# Patient Record
Sex: Male | Born: 1960 | Race: White | Hispanic: No | Marital: Single | Smoking: Current every day smoker
Health system: Southern US, Community
[De-identification: ages and names within clinical notes are randomized; demographics above are authoritative.]

## PROBLEM LIST (undated history)

## (undated) DIAGNOSIS — I10 Essential (primary) hypertension: Secondary | ICD-10-CM

## (undated) DIAGNOSIS — Z87442 Personal history of urinary calculi: Secondary | ICD-10-CM

## (undated) DIAGNOSIS — R7303 Prediabetes: Secondary | ICD-10-CM

## (undated) DIAGNOSIS — I739 Peripheral vascular disease, unspecified: Secondary | ICD-10-CM

## (undated) DIAGNOSIS — E46 Unspecified protein-calorie malnutrition: Secondary | ICD-10-CM

## (undated) DIAGNOSIS — Z720 Tobacco use: Secondary | ICD-10-CM

## (undated) DIAGNOSIS — D509 Iron deficiency anemia, unspecified: Secondary | ICD-10-CM

## (undated) DIAGNOSIS — N200 Calculus of kidney: Secondary | ICD-10-CM

## (undated) DIAGNOSIS — J189 Pneumonia, unspecified organism: Secondary | ICD-10-CM

---

## 2012-08-06 ENCOUNTER — Emergency Department (HOSPITAL_COMMUNITY)
Admission: EM | Admit: 2012-08-06 | Discharge: 2012-08-07 | Disposition: A | Discharge status: Home / Self Care | Payer: Managed Care, Other (non HMO) | Attending: Emergency Medicine | Admitting: Emergency Medicine

## 2012-08-06 ENCOUNTER — Emergency Department (HOSPITAL_COMMUNITY): Payer: Managed Care, Other (non HMO)

## 2012-08-06 ENCOUNTER — Encounter (HOSPITAL_COMMUNITY): Payer: Self-pay | Admitting: Emergency Medicine

## 2012-08-06 DIAGNOSIS — M545 Low back pain, unspecified: Secondary | ICD-10-CM | POA: Insufficient documentation

## 2012-08-06 DIAGNOSIS — F17200 Nicotine dependence, unspecified, uncomplicated: Secondary | ICD-10-CM | POA: Insufficient documentation

## 2012-08-06 DIAGNOSIS — I10 Essential (primary) hypertension: Secondary | ICD-10-CM | POA: Insufficient documentation

## 2012-08-06 DIAGNOSIS — M549 Dorsalgia, unspecified: Secondary | ICD-10-CM

## 2012-08-06 DIAGNOSIS — Z87442 Personal history of urinary calculi: Secondary | ICD-10-CM | POA: Insufficient documentation

## 2012-08-06 DIAGNOSIS — Z79899 Other long term (current) drug therapy: Secondary | ICD-10-CM | POA: Insufficient documentation

## 2012-08-06 HISTORY — DX: Calculus of kidney: N20.0

## 2012-08-06 HISTORY — DX: Essential (primary) hypertension: I10

## 2012-08-06 LAB — URINALYSIS, ROUTINE W REFLEX MICROSCOPIC
Glucose, UA: NEGATIVE mg/dL
Ketones, ur: NEGATIVE mg/dL
Leukocytes, UA: NEGATIVE
Specific Gravity, Urine: 1.012 (ref 1.005–1.030)
pH: 5 (ref 5.0–8.0)

## 2012-08-06 MED ORDER — HYDROCHLOROTHIAZIDE 25 MG PO TABS: 25.0000 mg | ORAL_TABLET | Freq: Every day | ORAL | Status: AC

## 2012-08-06 MED ORDER — HYDROCODONE-ACETAMINOPHEN 5-325 MG PO TABS: 2.0000 | ORAL_TABLET | ORAL | Status: DC | PRN

## 2012-08-06 MED ORDER — OXYCODONE-ACETAMINOPHEN 5-325 MG PO TABS
1.0000 | ORAL_TABLET | Freq: Once | ORAL | Status: AC
  Administered 2012-08-06: 1 via ORAL
  Filled 2012-08-06: qty 1

## 2012-08-06 MED ORDER — CYCLOBENZAPRINE HCL 5 MG PO TABS: 5.0000 mg | ORAL_TABLET | Freq: Three times a day (TID) | ORAL | Status: DC | PRN

## 2012-08-06 MED ORDER — OXYCODONE-ACETAMINOPHEN 5-325 MG PO TABS
1.0000 | ORAL_TABLET | Freq: Once | ORAL | Status: AC
  Administered 2012-08-07: 1 via ORAL
  Filled 2012-08-06: qty 1

## 2012-08-06 MED ORDER — CYCLOBENZAPRINE HCL 10 MG PO TABS
5.0000 mg | ORAL_TABLET | Freq: Once | ORAL | Status: AC
  Administered 2012-08-07: 5 mg via ORAL
  Filled 2012-08-06: qty 1

## 2012-08-06 NOTE — ED Notes (Addendum)
C/o R sided lower back pain since yesterday.  Pain worse with movement.  Denies urinary complaints.  Pain now 4/10 with intermittent episodes of pain 10/10. Pt has history of kidney stones 24 years ago.

## 2012-08-06 NOTE — ED Provider Notes (Signed)
History   This chart was scribed for Arman Filter, NP by Melba Coon, ED Scribe. The patient was seen in room TR10C/TR10C and the patient's care was started at 9:26PM.    CSN: [id number]  Arrival date & time 08/06/12  2055   First MD Initiated Contact with Patient 08/06/12 2117      Chief Complaint  Patient presents with  . Back Pain    (Consider location/radiation/quality/duration/timing/severity/associated sxs/prior treatment) The history is provided by the patient. No language interpreter was used.   Kirk Carrillo is a 52 y.o. male who presents to the Emergency Department complaining of intermittent, moderate to severe back pain with an onset today. He reports no mechanism of injury, falls, or trauma to his back. He reports that the pain is similar to when he had nephrolithiasis 25 years ago. He has not taken any pain meds at home. He reports that coughing and certain movements aggravate the pain. Denies HA, fever, diaphoresis, chills, neck pain, sore throat, rash, CP, SOB, abd pain, nausea, emesis, diarrhea, dysuria, bowel or bladder dysfunction, or extremity pain, edema, weakness, numbness, or tingling. No IV drug abuse. No known allergies. No other pertinent medical symptoms.   Past Medical History  Diagnosis Date  . Kidney stone   . Hypertension     History reviewed. No pertinent past surgical history.  No family history on file.  History  Substance Use Topics  . Smoking status: Current Every Day Smoker  . Smokeless tobacco: Not on file  . Alcohol Use: Yes      Review of Systems  Constitutional: Negative for fever, chills and diaphoresis.       10 Systems reviewed and are negative for acute change except as noted in the HPI.  HENT: Negative for congestion, rhinorrhea and neck pain.   Eyes: Negative for discharge and redness.  Respiratory: Negative for cough and shortness of breath.   Cardiovascular: Negative for chest pain.  Gastrointestinal: Negative for  vomiting, abdominal pain and diarrhea.  Genitourinary: Negative for dysuria.  Musculoskeletal: Positive for back pain.  Skin: Negative for rash.  Neurological: Negative for dizziness, syncope, speech difficulty, weakness, numbness and headaches.  Psychiatric/Behavioral: Negative for suicidal ideas, hallucinations and confusion.       No behavior change.   10 Systems reviewed and all are negative for acute change except as noted in the HPI.   Allergies  Review of patient's allergies indicates no known allergies.  Home Medications   Current Outpatient Rx  Name  Route  Sig  Dispense  Refill  . CYCLOBENZAPRINE HCL 5 MG PO TABS   Oral   Take 1 tablet (5 mg total) by mouth 3 (three) times daily as needed for muscle spasms.   30 tablet   0   . HYDROCHLOROTHIAZIDE 25 MG PO TABS   Oral   Take 1 tablet (25 mg total) by mouth daily.   30 tablet   2   . HYDROCODONE-ACETAMINOPHEN 5-325 MG PO TABS   Oral   Take 2 tablets by mouth every 4 (four) hours as needed for pain.   10 tablet   0     BP 162/78  Pulse 92  Temp 98.9 F (37.2 C) (Oral)  Resp 16  SpO2 97%  Physical Exam  Nursing note and vitals reviewed. Constitutional:       Awake, alert, nontoxic appearance.  HENT:  Head: Atraumatic.  Eyes: Right eye exhibits no discharge. Left eye exhibits no discharge.  Neck: Normal range of  motion. Neck supple.       No C-spine tenderness.  Cardiovascular: Normal rate, regular rhythm and normal heart sounds.   Pulmonary/Chest: Effort normal. He exhibits no tenderness.  Abdominal: Soft. There is no tenderness. There is no rebound.  Musculoskeletal: He exhibits no tenderness.       Baseline ROM, no obvious new focal weakness. Spine is non-tender.  Neurological:       Mental status and motor strength appears baseline for patient and situation. Gait is normal and even.  Skin: No rash noted.  Psychiatric: He has a normal mood and affect.    ED Course  Procedures (including  critical care time)  COORDINATION OF CARE:  9:34PM - abd/pelvis CT w/o contrast and UA will be ordered for Kirk Carrillo.      Labs Reviewed  URINALYSIS, ROUTINE W REFLEX MICROSCOPIC  LAB REPORT - SCANNED   No results found.   1. Back pain       MDM     I personally performed the services described in this documentation, which was scribed in my presence. The recorded information has been reviewed and is accurate.      Arman Filter, NP 08/10/12 0308  Arman Filter, NP 08/10/12 [phone]

## 2012-08-06 NOTE — ED Notes (Signed)
Patient transported to CT 

## 2012-08-10 NOTE — ED Provider Notes (Signed)
Medical screening examination/treatment/procedure(s) were performed by non-physician practitioner and as supervising physician I was immediately available for consultation/collaboration.   Dione Booze, MD 08/10/12 [phone]

## 2013-08-15 ENCOUNTER — Institutional Professional Consult (permissible substitution): Payer: Self-pay | Admitting: Medical

## 2020-02-15 ENCOUNTER — Ambulatory Visit (HOSPITAL_COMMUNITY)
Admission: EM | Admit: 2020-02-15 | Discharge: 2020-02-15 | Disposition: A | Discharge status: Home / Self Care | Payer: HRSA Program | Attending: Emergency Medicine | Admitting: Emergency Medicine

## 2020-02-15 ENCOUNTER — Other Ambulatory Visit: Payer: Self-pay

## 2020-02-15 ENCOUNTER — Encounter (HOSPITAL_COMMUNITY): Payer: Self-pay

## 2020-02-15 DIAGNOSIS — R0981 Nasal congestion: Secondary | ICD-10-CM | POA: Insufficient documentation

## 2020-02-15 DIAGNOSIS — Z79899 Other long term (current) drug therapy: Secondary | ICD-10-CM | POA: Insufficient documentation

## 2020-02-15 DIAGNOSIS — F17210 Nicotine dependence, cigarettes, uncomplicated: Secondary | ICD-10-CM | POA: Diagnosis not present

## 2020-02-15 DIAGNOSIS — Z20822 Contact with and (suspected) exposure to covid-19: Secondary | ICD-10-CM | POA: Diagnosis not present

## 2020-02-15 DIAGNOSIS — I10 Essential (primary) hypertension: Secondary | ICD-10-CM | POA: Insufficient documentation

## 2020-02-15 DIAGNOSIS — J40 Bronchitis, not specified as acute or chronic: Secondary | ICD-10-CM | POA: Diagnosis present

## 2020-02-15 MED ORDER — BENZONATATE 100 MG PO CAPS: 100.0000 mg | ORAL_CAPSULE | Freq: Three times a day (TID) | ORAL | 0 refills | Status: DC | PRN

## 2020-02-15 MED ORDER — ALBUTEROL SULFATE HFA 108 (90 BASE) MCG/ACT IN AERS: 1.0000 | INHALATION_SPRAY | Freq: Four times a day (QID) | RESPIRATORY_TRACT | 0 refills | Status: DC | PRN

## 2020-02-15 MED ORDER — PREDNISONE 20 MG PO TABS: 40.0000 mg | ORAL_TABLET | Freq: Every day | ORAL | 0 refills | Status: AC

## 2020-02-15 NOTE — ED Provider Notes (Signed)
[facility]    CSN: [id number] Arrival date & time: 02/15/20  0830      History   Chief Complaint Chief Complaint  Patient presents with  . Cough  . Nasal Congestion    HPI Nazario Russom is a 59 y.o. male.   Baker Moronta presents with complaints of "nagging" dry cough for the past 3 weeks. Worse at night. No fever. No sore throat. No gi symptoms. No nasal congestion. He states it is "like a smokers cough" but worse than typical. He has been smoking for 40 years, currently approximately 1/2ppd. No chest pain. No arm pain or palpitations. Has been trying over the counter medications such as robitussin which haven't helped. Doesn't have an inhaler. States he does have a PCP he follows with.    ROS per HPI, negative if not otherwise mentioned.      Past Medical History:  Diagnosis Date  . Hypertension   . Kidney stone     There are no problems to display for this patient.   History reviewed. No pertinent surgical history.     Home Medications    Prior to Admission medications   Medication Sig Start Date End Date Taking? Authorizing Provider  hydrochlorothiazide (HYDRODIURIL) 25 MG tablet Take 1 tablet (25 mg total) by mouth daily. 08/06/12  Yes Earley Favor, NP  albuterol [facility] HFA) 108 [phone] Base) MCG/ACT inhaler Inhale 1-2 puffs into the lungs every 6 (six) hours as needed for wheezing or shortness of breath. 02/15/20   Georgetta Haber, NP  benzonatate (TESSALON) 100 MG capsule Take 1-2 capsules (100-200 mg total) by mouth 3 (three) times daily as needed for cough. 02/15/20   Georgetta Haber, NP  cyclobenzaprine (FLEXERIL) 5 MG tablet Take 1 tablet (5 mg total) by mouth 3 (three) times daily as needed for muscle spasms. 08/06/12   Earley Favor, NP  HYDROcodone-acetaminophen (NORCO/VICODIN) 5-325 MG per tablet Take 2 tablets by mouth every 4 (four) hours as needed for pain. 08/06/12   Earley Favor, NP  predniSONE (DELTASONE) 20 MG tablet Take 2 tablets (40 mg  total) by mouth daily with breakfast for 5 days. 02/15/20 02/20/20  Georgetta Haber, NP    Family History Family History  Problem Relation Age of Onset  . Cancer Mother     Social History Social History   Tobacco Use  . Smoking status: Current Every Day Smoker  . Smokeless tobacco: Never Used  Vaping Use  . Vaping Use: Never used  Substance Use Topics  . Alcohol use: Yes  . Drug use: No     Allergies   Telmisartan   Review of Systems Review of Systems   Physical Exam Triage Vital Signs ED Triage Vitals  Enc Vitals Group     BP 02/15/20 0955 118/72     Pulse Rate 02/15/20 0955 98     Resp 02/15/20 0955 18     Temp 02/15/20 0955 98.4 F (36.9 C)     Temp Source 02/15/20 0955 Oral     SpO2 02/15/20 0955 98 %     Weight --      Height --      Head Circumference --      Peak Flow --      Pain Score 02/15/20 0951 0     Pain Loc --      Pain Edu? --      Excl. in GC? --    No data found.  Updated Vital Signs  BP 118/72 (BP Location: Left Arm)   Pulse 98   Temp 98.4 F (36.9 C) (Oral)   Resp 18   SpO2 98%    Physical Exam Constitutional:      Appearance: He is well-developed.  Cardiovascular:     Rate and Rhythm: Normal rate and regular rhythm.  Pulmonary:     Effort: Pulmonary effort is normal.     Comments: Coarse lung sounds throughout  Skin:    General: Skin is warm and dry.  Neurological:     Mental Status: He is alert and oriented to person, place, and time.      UC Treatments / Results  Labs (all labs ordered are listed, but only abnormal results are displayed) Labs Reviewed  SARS CORONAVIRUS 2 (TAT 6-24 HRS)    EKG   Radiology No results found.  Procedures Procedures (including critical care time)  Medications Ordered in UC Medications - No data to display  Initial Impression / Assessment and Plan / UC Course  I have reviewed the triage vital signs and the nursing notes.  Pertinent labs & imaging results that were  available during my care of the patient were reviewed by me and considered in my medical decision making (see chart for details).     Non toxic, no increased work of breathing. Smoker for 40 years with worsening cough. No fevers or other URI symptoms. Do recommend a chest xray today, patient declines, however. Prednisone pack provided and recommendations for follow up and recheck with pcp. Return precautions provided. Patient verbalized understanding and agreeable to plan.   Final Clinical Impressions(s) / UC Diagnoses   Final diagnoses:  Bronchitis     Discharge Instructions     I do recommend a chest xray due to your cough and smoking history.  Complete course of steroids.  Use of inhaler as needed for wheezing or shortness of breath.   Use of tessalon as needed for cough.  If symptoms worsen or do not improve in the next week to return to be seen or to follow up with your PCP.     ED Prescriptions    Medication Sig Dispense Auth. Provider   albuterol (PROAIR HFA) 108 (90 Base) MCG/ACT inhaler Inhale 1-2 puffs into the lungs every 6 (six) hours as needed for wheezing or shortness of breath. 8 g Linus Mako B, NP   predniSONE (DELTASONE) 20 MG tablet Take 2 tablets (40 mg total) by mouth daily with breakfast for 5 days. 10 tablet Linus Mako B, NP   benzonatate (TESSALON) 100 MG capsule Take 1-2 capsules (100-200 mg total) by mouth 3 (three) times daily as needed for cough. 21 capsule Georgetta Haber, NP     PDMP not reviewed this encounter.   Georgetta Haber, NP 02/15/20 1423

## 2020-02-15 NOTE — ED Triage Notes (Signed)
Pt c/o non-productive cough for approx 3 weeks. Congestion, runny nose onset yesterday. Also reports change is sense of taste. OTC medications not fully relieving symptoms.  Denies fever, chills, SOB, abdominal pain, n/v/d, body aches. Able to speak full sentences w/o difficulty. Bilat breath sounds CTA.

## 2020-02-15 NOTE — Discharge Instructions (Addendum)
I do recommend a chest xray due to your cough and smoking history.  Complete course of steroids.  Use of inhaler as needed for wheezing or shortness of breath.   Use of tessalon as needed for cough.  If symptoms worsen or do not improve in the next week to return to be seen or to follow up with your PCP.

## 2020-02-16 LAB — SARS CORONAVIRUS 2 (TAT 6-24 HRS): SARS Coronavirus 2: NEGATIVE

## 2020-06-20 ENCOUNTER — Observation Stay (HOSPITAL_COMMUNITY): Payer: BLUE CROSS/BLUE SHIELD

## 2020-06-20 ENCOUNTER — Emergency Department (HOSPITAL_COMMUNITY): Payer: BLUE CROSS/BLUE SHIELD

## 2020-06-20 ENCOUNTER — Encounter (HOSPITAL_COMMUNITY): Payer: Self-pay | Admitting: Emergency Medicine

## 2020-06-20 ENCOUNTER — Inpatient Hospital Stay (HOSPITAL_COMMUNITY)
Admission: EM | Admit: 2020-06-20 | Discharge: 2020-06-28 | DRG: 871 | Disposition: A | Discharge status: Home / Self Care | Payer: BLUE CROSS/BLUE SHIELD | Source: Ambulatory Visit | Attending: Internal Medicine | Admitting: Internal Medicine

## 2020-06-20 DIAGNOSIS — D509 Iron deficiency anemia, unspecified: Secondary | ICD-10-CM | POA: Diagnosis present

## 2020-06-20 DIAGNOSIS — R042 Hemoptysis: Secondary | ICD-10-CM | POA: Diagnosis present

## 2020-06-20 DIAGNOSIS — J869 Pyothorax without fistula: Secondary | ICD-10-CM | POA: Diagnosis present

## 2020-06-20 DIAGNOSIS — E876 Hypokalemia: Secondary | ICD-10-CM | POA: Diagnosis present

## 2020-06-20 DIAGNOSIS — Z803 Family history of malignant neoplasm of breast: Secondary | ICD-10-CM

## 2020-06-20 DIAGNOSIS — I739 Peripheral vascular disease, unspecified: Secondary | ICD-10-CM | POA: Diagnosis present

## 2020-06-20 DIAGNOSIS — R918 Other nonspecific abnormal finding of lung field: Secondary | ICD-10-CM

## 2020-06-20 DIAGNOSIS — I10 Essential (primary) hypertension: Secondary | ICD-10-CM | POA: Diagnosis present

## 2020-06-20 DIAGNOSIS — Z9889 Other specified postprocedural states: Secondary | ICD-10-CM

## 2020-06-20 DIAGNOSIS — F17210 Nicotine dependence, cigarettes, uncomplicated: Secondary | ICD-10-CM | POA: Diagnosis present

## 2020-06-20 DIAGNOSIS — Z888 Allergy status to other drugs, medicaments and biological substances status: Secondary | ICD-10-CM

## 2020-06-20 DIAGNOSIS — R6510 Systemic inflammatory response syndrome (SIRS) of non-infectious origin without acute organ dysfunction: Secondary | ICD-10-CM

## 2020-06-20 DIAGNOSIS — J850 Gangrene and necrosis of lung: Secondary | ICD-10-CM | POA: Diagnosis not present

## 2020-06-20 DIAGNOSIS — A419 Sepsis, unspecified organism: Principal | ICD-10-CM | POA: Diagnosis present

## 2020-06-20 DIAGNOSIS — R61 Generalized hyperhidrosis: Secondary | ICD-10-CM | POA: Diagnosis present

## 2020-06-20 DIAGNOSIS — Z87442 Personal history of urinary calculi: Secondary | ICD-10-CM

## 2020-06-20 DIAGNOSIS — I70221 Atherosclerosis of native arteries of extremities with rest pain, right leg: Secondary | ICD-10-CM | POA: Diagnosis present

## 2020-06-20 DIAGNOSIS — J90 Pleural effusion, not elsewhere classified: Secondary | ICD-10-CM

## 2020-06-20 DIAGNOSIS — E538 Deficiency of other specified B group vitamins: Secondary | ICD-10-CM | POA: Diagnosis present

## 2020-06-20 DIAGNOSIS — E11621 Type 2 diabetes mellitus with foot ulcer: Secondary | ICD-10-CM | POA: Diagnosis present

## 2020-06-20 DIAGNOSIS — Z681 Body mass index (BMI) 19 or less, adult: Secondary | ICD-10-CM

## 2020-06-20 DIAGNOSIS — E440 Moderate protein-calorie malnutrition: Secondary | ICD-10-CM | POA: Diagnosis present

## 2020-06-20 DIAGNOSIS — F1010 Alcohol abuse, uncomplicated: Secondary | ICD-10-CM | POA: Diagnosis present

## 2020-06-20 DIAGNOSIS — Z20822 Contact with and (suspected) exposure to covid-19: Secondary | ICD-10-CM | POA: Diagnosis present

## 2020-06-20 DIAGNOSIS — J189 Pneumonia, unspecified organism: Secondary | ICD-10-CM | POA: Diagnosis present

## 2020-06-20 DIAGNOSIS — Z9689 Presence of other specified functional implants: Secondary | ICD-10-CM

## 2020-06-20 DIAGNOSIS — L97512 Non-pressure chronic ulcer of other part of right foot with fat layer exposed: Secondary | ICD-10-CM | POA: Diagnosis present

## 2020-06-20 DIAGNOSIS — E871 Hypo-osmolality and hyponatremia: Secondary | ICD-10-CM | POA: Diagnosis present

## 2020-06-20 HISTORY — DX: Peripheral vascular disease, unspecified: I73.9

## 2020-06-20 HISTORY — DX: Unspecified protein-calorie malnutrition: E46

## 2020-06-20 HISTORY — DX: Tobacco use: Z72.0

## 2020-06-20 HISTORY — DX: Iron deficiency anemia, unspecified: D50.9

## 2020-06-20 LAB — CBC WITH DIFFERENTIAL/PLATELET
Abs Immature Granulocytes: 0.17 10*3/uL — ABNORMAL HIGH (ref 0.00–0.07)
Basophils Absolute: 0.1 10*3/uL (ref 0.0–0.1)
Basophils Relative: 0 %
Eosinophils Absolute: 0.1 10*3/uL (ref 0.0–0.5)
Eosinophils Relative: 0 %
HCT: 30.7 % — ABNORMAL LOW (ref 39.0–52.0)
Hemoglobin: 9.6 g/dL — ABNORMAL LOW (ref 13.0–17.0)
Immature Granulocytes: 1 %
Lymphocytes Relative: 13 %
Lymphs Abs: 2.5 10*3/uL (ref 0.7–4.0)
MCH: 24.9 pg — ABNORMAL LOW (ref 26.0–34.0)
MCHC: 31.3 g/dL (ref 30.0–36.0)
MCV: 79.5 fL — ABNORMAL LOW (ref 80.0–100.0)
Monocytes Absolute: 1.2 10*3/uL — ABNORMAL HIGH (ref 0.1–1.0)
Monocytes Relative: 6 %
Neutro Abs: 15.1 10*3/uL — ABNORMAL HIGH (ref 1.7–7.7)
Neutrophils Relative %: 80 %
Platelets: 673 10*3/uL — ABNORMAL HIGH (ref 150–400)
RBC: 3.86 MIL/uL — ABNORMAL LOW (ref 4.22–5.81)
RDW: 16.3 % — ABNORMAL HIGH (ref 11.5–15.5)
WBC: 19.1 10*3/uL — ABNORMAL HIGH (ref 4.0–10.5)
nRBC: 0 % (ref 0.0–0.2)

## 2020-06-20 LAB — BASIC METABOLIC PANEL
Anion gap: 15 (ref 5–15)
BUN: 11 mg/dL (ref 8–23)
CO2: 25 mmol/L (ref 22–32)
Calcium: 8.9 mg/dL (ref 8.9–10.3)
Chloride: 89 mmol/L — ABNORMAL LOW (ref 98–111)
Creatinine, Ser: 0.69 mg/dL (ref 0.61–1.24)
GFR, Estimated: >60 mL/min (ref >60)
Glucose, Bld: 88 mg/dL (ref 70–99)
Potassium: 3.6 mmol/L (ref 3.5–5.1)
Sodium: 129 mmol/L — ABNORMAL LOW (ref 135–145)

## 2020-06-20 LAB — HEPATIC FUNCTION PANEL
ALT: 20 U/L (ref 0–44)
AST: 22 U/L (ref 15–41)
Albumin: 2.3 g/dL — ABNORMAL LOW (ref 3.5–5.0)
Alkaline Phosphatase: 86 U/L (ref 38–126)
Bilirubin, Direct: 0.1 mg/dL (ref 0.0–0.2)
Indirect Bilirubin: 1.3 mg/dL — ABNORMAL HIGH (ref 0.3–0.9)
Total Bilirubin: 1.4 mg/dL — ABNORMAL HIGH (ref 0.3–1.2)
Total Protein: 7.5 g/dL (ref 6.5–8.1)

## 2020-06-20 LAB — LIPASE, BLOOD: Lipase: 19 U/L (ref 11–51)

## 2020-06-20 LAB — LACTIC ACID, PLASMA: Lactic Acid, Venous: 1 mmol/L (ref 0.5–1.9)

## 2020-06-20 MED ORDER — PIPERACILLIN-TAZOBACTAM 3.375 G IVPB: 3.3750 g | Freq: Three times a day (TID) | INTRAVENOUS | Status: DC

## 2020-06-20 MED ORDER — VANCOMYCIN HCL 750 MG/150ML IV SOLN
750.0000 mg | Freq: Two times a day (BID) | INTRAVENOUS | Status: DC
  Administered 2020-06-21 (×2): 750 mg via INTRAVENOUS
  Filled 2020-06-20 (×4): qty 150

## 2020-06-20 MED ORDER — PIPERACILLIN-TAZOBACTAM 3.375 G IVPB 30 MIN
3.3750 g | Freq: Once | INTRAVENOUS | Status: AC
  Administered 2020-06-20: 3.375 g via INTRAVENOUS
  Filled 2020-06-20: qty 50

## 2020-06-20 MED ORDER — SODIUM CHLORIDE 0.9 % IV BOLUS
1000.0000 mL | Freq: Once | INTRAVENOUS | Status: AC
  Administered 2020-06-20: 1000 mL via INTRAVENOUS

## 2020-06-20 MED ORDER — VANCOMYCIN HCL IN DEXTROSE 1-5 GM/200ML-% IV SOLN
1000.0000 mg | Freq: Once | INTRAVENOUS | Status: AC
  Administered 2020-06-21: 1000 mg via INTRAVENOUS
  Filled 2020-06-20: qty 200

## 2020-06-20 MED ORDER — IOHEXOL 300 MG/ML  SOLN
75.0000 mL | Freq: Once | INTRAMUSCULAR | Status: AC | PRN
  Administered 2020-06-20: 75 mL via INTRAVENOUS

## 2020-06-20 NOTE — Progress Notes (Signed)
Pharmacy Antibiotic Note  Kirk Carrillo is a 59 y.o. male admitted on 06/20/2020 with pneumonia and Wound infection.  Pharmacy has been consulted for Zosyn and Vancomycin dosing.   Height: 5' 7.5" (171.5 cm) Weight: 56.1 kg (123 lb 10.9 oz) IBW/kg (Calculated) : 67.25  Temp (24hrs), Avg:99.3 F (37.4 C), Min:99.2 F (37.3 C), Max:99.4 F (37.4 C)  Recent Labs  Lab 06/20/20 2134 06/20/20 2147  WBC 19.1*  --   CREATININE 0.69  --   LATICACIDVEN  --  1.0    Estimated Creatinine Clearance: 76.9 mL/min (by C-G formula based on SCr of 0.69 mg/dL).    Allergies  Allergen Reactions  . Telmisartan Other (See Comments)    Facial; angioedema.     Antimicrobials this admission: 11/19 Zosyn >>  11/19 Vancomycin >>   Dose adjustments this admission: N/a  Microbiology results: Pending   Plan:  - Zosyn 3.375g IV x 1 dose over 30 min followed by Zosyn 3.375g IV every 8 hours infused over 4 hours  - Vancomycin 1000mg  IV x 1 dose  - Followed by Vancomycin 750mg  IV q12h - Goal trough ~ 15 - Monitor patients renal function and urine output  - De-escalate ABX when appropriate   Thank you for allowing pharmacy to be a part of this patient's care.  PharmD. BCPS 06/20/2020 10:42 PM

## 2020-06-20 NOTE — ED Provider Notes (Signed)
[facility] EMERGENCY DEPARTMENT Provider Note   CSN: [id number] Arrival date & time: 06/20/20  1231     History Chief Complaint  Patient presents with   Foot Pain    Kirk Carrillo is a 59 y.o. male with history of hypertension, tobacco use presents to the ED from [facility] urgent care for evaluation of right foot wounds.  Patient states a few weeks ago he got new sneakers that were tight around the toes.  Last week he noticed some discomfort in between his right fourth and fifth toes where they touch and noticed that he had wounds there.  Reports some soreness there but not significant.  Went to urgent care earlier today who told him that he had bone exposed through one of the wounds.  He had blood work and his WBC was 22.  They tried to get him to be seen by a podiatrist but was unable to be fit into his scheduled and told to come to the ED for further evaluation.  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  Reports a longstanding "smoker's cough" for years.  States he has been cutting back on his smoking and actually cough seems a little better except yesterday he had 3 coughing fits that were so forceful that he vomited his dinner 3 times.  No hemoptysis. Has not weighed himself in 6 to 8 months but today he noticed he had dropped 18 pounds unintentionally.  Had fevers and chills after his Covid shot but this resolved after 18 hours.  No longer having fever, chills.  No chest pain, shortness of breath.  No abdominal pain.  No diarrhea.  No dysuria.  No history of diabetes.   HPI     Past Medical History:  Diagnosis Date   Hypertension    Kidney stone     Patient Active Problem List   Diagnosis Date Noted   Sepsis (HCC) 06/20/2020    History reviewed. No pertinent surgical history.     Family History  Problem Relation Age of Onset   Cancer Mother     Social History    Tobacco Use   Smoking status: Current Every Day Smoker   Smokeless tobacco: Never Used  Vaping Use   Vaping Use: Never used  Substance Use Topics   Alcohol use: Yes   Drug use: No    Home Medications Prior to Admission medications   Medication Sig Start Date End Date Taking? Authorizing Provider  albuterol (PROAIR HFA) 108 (90 Base) MCG/ACT inhaler Inhale 1-2 puffs into the lungs every 6 (six) hours as needed for wheezing or shortness of breath. 02/15/20   Georgetta Haber, NP  benzonatate (TESSALON) 100 MG capsule Take 1-2 capsules (100-200 mg total) by mouth 3 (three) times daily as needed for cough. 02/15/20   Georgetta Haber, NP  cyclobenzaprine (FLEXERIL) 5 MG tablet Take 1 tablet (5 mg total) by mouth 3 (three) times daily as needed for muscle spasms. 08/06/12   Earley Favor, NP  hydrochlorothiazide (HYDRODIURIL) 25 MG tablet Take 1 tablet (25 mg total) by mouth daily. 08/06/12   Earley Favor, NP  HYDROcodone-acetaminophen (NORCO/VICODIN) 5-325 MG per tablet Take 2 tablets by mouth every 4 (four) hours as needed for pain. 08/06/12   Earley Favor, NP    Allergies    Telmisartan  Review of Systems   Review of Systems  Skin: Positive for wound.  All other  systems reviewed and are negative.   Physical Exam Updated Vital Signs BP 132/84    Pulse 100    Temp 99.4 F (37.4 C) (Oral)    Resp 18    Ht 5' 7.5" (1.715 m)    Wt 56.1 kg    SpO2 96%    BMI 19.08 kg/m   Physical Exam Vitals and nursing note reviewed.  Constitutional:      General: He is not in acute distress.    Appearance: He is well-developed.     Comments: NAD.  HENT:     Head: Normocephalic and atraumatic.     Right Ear: External ear normal.     Left Ear: External ear normal.     Nose: Nose normal.  Eyes:     General: No scleral icterus.    Conjunctiva/sclera: Conjunctivae normal.  Cardiovascular:     Rate and Rhythm: Normal rate and regular rhythm.     Heart sounds: Normal heart sounds. No murmur  heard.   Pulmonary:     Effort: Pulmonary effort is normal.     Comments: Decreased BS right lung. No wheezing. No crackles. No increased work of breathing  Musculoskeletal:        General: No deformity. Normal range of motion.     Cervical back: Normal range of motion and neck supple.  Skin:    General: Skin is warm and dry.     Capillary Refill: Capillary refill takes less than 2 seconds.     Comments: Ulcerated wound lateral 4th toe with white tissue exposed, mildly tender. Wound edges erythematous.   Ulcerated wound medial 5th toe with subcutaneous tissue exposed, no tenderness. No erythema.    No evidence of focal fluctuance or abscess. No significant erythema proximally.   Varicose veins noted around right ankle   Neurological:     Mental Status: He is alert and oriented to person, place, and time.  Psychiatric:        Behavior: Behavior normal.        Thought Content: Thought content normal.        Judgment: Judgment normal.         ED Results / Procedures / Treatments   Labs (all labs ordered are listed, but only abnormal results are displayed) Labs Reviewed  CBC WITH DIFFERENTIAL/PLATELET - Abnormal; Notable for the following components:      Result Value   WBC 19.1 (*)    RBC 3.86 (*)    Hemoglobin 9.6 (*)    HCT 30.7 (*)    MCV 79.5 (*)    MCH 24.9 (*)    RDW 16.3 (*)    Platelets 673 (*)    Neutro Abs 15.1 (*)    Monocytes Absolute 1.2 (*)    Abs Immature Granulocytes 0.17 (*)    All other components within normal limits  HEPATIC FUNCTION PANEL - Abnormal; Notable for the following components:   Albumin 2.3 (*)    Total Bilirubin 1.4 (*)    Indirect Bilirubin 1.3 (*)    All other components within normal limits  BASIC METABOLIC PANEL - Abnormal; Notable for the following components:   Sodium 129 (*)    Chloride 89 (*)    All other components within normal limits  CULTURE, BLOOD (ROUTINE X 2)  CULTURE, BLOOD (ROUTINE X 2)  RESPIRATORY PANEL BY RT  PCR (FLU A&B, COVID)  EXPECTORATED SPUTUM ASSESSMENT W REFEX TO RESP CULTURE  LACTIC ACID, PLASMA  LIPASE, BLOOD  LACTIC  ACID, PLASMA  URINALYSIS, COMPLETE (UACMP) WITH MICROSCOPIC    EKG None  Radiology DG Chest 2 View  Result Date: 06/20/2020 CLINICAL DATA:  Chronic cough, weight loss EXAM: CHEST - 2 VIEW COMPARISON:  None. FINDINGS: Heart is normal size. Patchy airspace disease throughout the right lung. Small right pleural effusion. Left lung clear. No acute bony abnormality. IMPRESSION: Volume loss with patchy airspace disease throughout the right lung. Cannot exclude pneumonia. Small right pleural effusion. Electronically Signed   By: Charlett Nose M.D.   On: 06/20/2020 21:33   DG Foot Complete Right  Result Date: 06/20/2020 CLINICAL DATA:  Deep wound lateral 4th and 5th toes. EXAM: RIGHT FOOT COMPLETE - 3+ VIEW COMPARISON:  None. FINDINGS: No acute bony abnormality. Specifically, no fracture, subluxation, or dislocation. No bone destruction to suggest osteomyelitis. No soft tissue gas. Joint spaces maintained. IMPRESSION: No acute bony abnormality. Electronically Signed   By: Charlett Nose M.D.   On: 06/20/2020 21:32    Procedures Procedures (including critical care time)  Medications Ordered in ED Medications  piperacillin-tazobactam (ZOSYN) IVPB 3.375 g (3.375 g Intravenous New Bag/Given 06/20/20 2326)  vancomycin (VANCOCIN) IVPB 1000 mg/200 mL premix (has no administration in time range)  piperacillin-tazobactam (ZOSYN) IVPB 3.375 g (has no administration in time range)  vancomycin (VANCOREADY) IVPB 750 mg/150 mL (has no administration in time range)  sodium chloride 0.9 % bolus 1,000 mL (1,000 mLs Intravenous New Bag/Given 06/20/20 2319)    ED Course  I have reviewed the triage vital signs and the nursing notes.  Pertinent labs & imaging results that were available during my care of the patient were reviewed by me and considered in my medical decision making (see chart  for details).  Clinical Course as of Jun 20 2329  Caleen Essex Jun 20, 2020  2155 IMPRESSION: Volume loss with patchy airspace disease throughout the right lung.  Cannot exclude pneumonia. Small right pleural effusion.    DG Chest 2 View [CG]  2155  IMPRESSION: No acute bony abnormality.    DG Foot Complete Right [CG]  2239 Pulse Rate(!): 116 [CG]  2239 WBC(!): 19.1 [CG]  2239 Hemoglobin(!): 9.6 [CG]  2239 Platelets(!): 673 [CG]    Clinical Course User Index [CG] Jerrell Mylar   MDM Rules/Calculators/A&P                          EMR, triage nursing notes reviewed to assist with history and MDM  Urgent care note reviewed-WBC at that time was 22,000.  He was discharged with doxycycline and instructed to come to the ED.  On exam, wound on the right fourth toe does appear deep with questionable bone exposed.  However no overt sign of severe infection, abscess, cellulitis.  Diminished lung sounds on the right side.  Patient tachycardic in the 110s.  Oral temp 99.4.  Reports chronic cough, long history of tobacco use and unintentional weight loss.  Given tachycardia, leukocytosis at urgent care will obtain lab work, imaging.  Labs ordered: CBC, BMP, LFTs, lipase, lactate, blood cultures, respiratory panel, urinalysis.  Imaging ordered: Right foot x-ray, chest x-ray, EKG.  ER work-up personally visualized and interpreted  Labs reviewed-leukocytosis WBC 19.1 with left shift, hemoglobin 9.6. Normal lactic acid, creatinine. No hypotension.   Imaging reviewed - Foot x-ray without obvious bony destruction or air. CXR shows large airspace disease throughout right lung with collapse, pleural effusion.    Medicines ordered: Technically meets SIRS criteria with WBC  and tachycardia. Source of infection foot vs lung.  ?Empyema. Will order broad spectrum antibiotic vanc/zosyn, IVF.   Will order CT chest to further evaluation right lung.  ?empyema malignancy.   Discussed with  hospitalist who will admit patient for SIRS, further work up. Discussed with EDP.   2330: Re-evaluated patient, no decline. Updated on POC and admission.  Final Clinical Impression(s) / ED Diagnoses Final diagnoses:  SIRS (systemic inflammatory response syndrome) (HCC)  Abnormal x-ray of lung    Rx / DC Orders ED Discharge Orders    None       Jerrell Mylar 06/20/20 2330    Charlynne Pander, MD 06/21/20 [phone]

## 2020-06-20 NOTE — ED Notes (Signed)
Pt given a turkey sandwich and a cup of water. 

## 2020-06-20 NOTE — ED Triage Notes (Addendum)
Pt reports wound to R 5th toe, states toes have been rubbing together for a while, saw doctor at [facility] and was supposed to see podiatrist there but could not so was sent here due to concern for bone being visible. Toe is purple in color but patient advises that he applied a purple colored medication and that skin was not dark in color prior to application.

## 2020-06-21 ENCOUNTER — Other Ambulatory Visit: Payer: Self-pay

## 2020-06-21 ENCOUNTER — Encounter (HOSPITAL_COMMUNITY): Payer: Self-pay | Admitting: Internal Medicine

## 2020-06-21 DIAGNOSIS — A419 Sepsis, unspecified organism: Secondary | ICD-10-CM | POA: Diagnosis present

## 2020-06-21 DIAGNOSIS — I10 Essential (primary) hypertension: Secondary | ICD-10-CM | POA: Diagnosis present

## 2020-06-21 DIAGNOSIS — Z803 Family history of malignant neoplasm of breast: Secondary | ICD-10-CM | POA: Diagnosis not present

## 2020-06-21 DIAGNOSIS — E440 Moderate protein-calorie malnutrition: Secondary | ICD-10-CM | POA: Diagnosis present

## 2020-06-21 DIAGNOSIS — F17210 Nicotine dependence, cigarettes, uncomplicated: Secondary | ICD-10-CM

## 2020-06-21 DIAGNOSIS — E871 Hypo-osmolality and hyponatremia: Secondary | ICD-10-CM | POA: Diagnosis present

## 2020-06-21 DIAGNOSIS — J189 Pneumonia, unspecified organism: Secondary | ICD-10-CM | POA: Diagnosis not present

## 2020-06-21 DIAGNOSIS — D509 Iron deficiency anemia, unspecified: Secondary | ICD-10-CM | POA: Diagnosis present

## 2020-06-21 DIAGNOSIS — J850 Gangrene and necrosis of lung: Secondary | ICD-10-CM | POA: Diagnosis present

## 2020-06-21 DIAGNOSIS — E876 Hypokalemia: Secondary | ICD-10-CM | POA: Diagnosis present

## 2020-06-21 DIAGNOSIS — L0390 Cellulitis, unspecified: Secondary | ICD-10-CM | POA: Diagnosis not present

## 2020-06-21 DIAGNOSIS — R61 Generalized hyperhidrosis: Secondary | ICD-10-CM | POA: Diagnosis present

## 2020-06-21 DIAGNOSIS — Z9889 Other specified postprocedural states: Secondary | ICD-10-CM | POA: Diagnosis not present

## 2020-06-21 DIAGNOSIS — J869 Pyothorax without fistula: Secondary | ICD-10-CM | POA: Diagnosis present

## 2020-06-21 DIAGNOSIS — L97512 Non-pressure chronic ulcer of other part of right foot with fat layer exposed: Secondary | ICD-10-CM | POA: Diagnosis present

## 2020-06-21 DIAGNOSIS — Z20822 Contact with and (suspected) exposure to covid-19: Secondary | ICD-10-CM | POA: Diagnosis present

## 2020-06-21 DIAGNOSIS — E538 Deficiency of other specified B group vitamins: Secondary | ICD-10-CM | POA: Diagnosis present

## 2020-06-21 DIAGNOSIS — F1010 Alcohol abuse, uncomplicated: Secondary | ICD-10-CM | POA: Diagnosis present

## 2020-06-21 DIAGNOSIS — Z01810 Encounter for preprocedural cardiovascular examination: Secondary | ICD-10-CM | POA: Diagnosis not present

## 2020-06-21 DIAGNOSIS — I70235 Atherosclerosis of native arteries of right leg with ulceration of other part of foot: Secondary | ICD-10-CM | POA: Diagnosis not present

## 2020-06-21 DIAGNOSIS — J90 Pleural effusion, not elsewhere classified: Secondary | ICD-10-CM | POA: Diagnosis not present

## 2020-06-21 DIAGNOSIS — R042 Hemoptysis: Secondary | ICD-10-CM | POA: Diagnosis present

## 2020-06-21 DIAGNOSIS — I70221 Atherosclerosis of native arteries of extremities with rest pain, right leg: Secondary | ICD-10-CM | POA: Diagnosis present

## 2020-06-21 DIAGNOSIS — Z681 Body mass index (BMI) 19 or less, adult: Secondary | ICD-10-CM | POA: Diagnosis not present

## 2020-06-21 DIAGNOSIS — Z87442 Personal history of urinary calculi: Secondary | ICD-10-CM | POA: Diagnosis not present

## 2020-06-21 DIAGNOSIS — I739 Peripheral vascular disease, unspecified: Secondary | ICD-10-CM | POA: Diagnosis not present

## 2020-06-21 DIAGNOSIS — Z888 Allergy status to other drugs, medicaments and biological substances status: Secondary | ICD-10-CM | POA: Diagnosis not present

## 2020-06-21 DIAGNOSIS — E11621 Type 2 diabetes mellitus with foot ulcer: Secondary | ICD-10-CM | POA: Diagnosis present

## 2020-06-21 LAB — COMPREHENSIVE METABOLIC PANEL
ALT: 18 U/L (ref 0–44)
AST: 22 U/L (ref 15–41)
Albumin: 1.8 g/dL — ABNORMAL LOW (ref 3.5–5.0)
Alkaline Phosphatase: 67 U/L (ref 38–126)
Anion gap: 10 (ref 5–15)
BUN: 8 mg/dL (ref 8–23)
CO2: 25 mmol/L (ref 22–32)
Calcium: 8.1 mg/dL — ABNORMAL LOW (ref 8.9–10.3)
Chloride: 97 mmol/L — ABNORMAL LOW (ref 98–111)
Creatinine, Ser: 0.5 mg/dL — ABNORMAL LOW (ref 0.61–1.24)
GFR, Estimated: >60 mL/min (ref >60)
Glucose, Bld: 91 mg/dL (ref 70–99)
Potassium: 3.1 mmol/L — ABNORMAL LOW (ref 3.5–5.1)
Sodium: 132 mmol/L — ABNORMAL LOW (ref 135–145)
Total Bilirubin: 0.7 mg/dL (ref 0.3–1.2)
Total Protein: 6.1 g/dL — ABNORMAL LOW (ref 6.5–8.1)

## 2020-06-21 LAB — IRON AND TIBC
Iron: 10 ug/dL — ABNORMAL LOW (ref 45–182)
Saturation Ratios: 5 % — ABNORMAL LOW (ref 17.9–39.5)
TIBC: 221 ug/dL — ABNORMAL LOW (ref 250–450)
UIBC: 211 ug/dL

## 2020-06-21 LAB — URINALYSIS, COMPLETE (UACMP) WITH MICROSCOPIC
Bacteria, UA: NONE SEEN
Bilirubin Urine: NEGATIVE
Glucose, UA: NEGATIVE mg/dL
Hgb urine dipstick: NEGATIVE
Ketones, ur: 20 mg/dL — AB
Leukocytes,Ua: NEGATIVE
Nitrite: NEGATIVE
Protein, ur: NEGATIVE mg/dL
Specific Gravity, Urine: 1.038 — ABNORMAL HIGH (ref 1.005–1.030)
pH: 6 (ref 5.0–8.0)

## 2020-06-21 LAB — CBC WITH DIFFERENTIAL/PLATELET
Abs Immature Granulocytes: 0.09 10*3/uL — ABNORMAL HIGH (ref 0.00–0.07)
Basophils Absolute: 0 10*3/uL (ref 0.0–0.1)
Basophils Relative: 0 %
Eosinophils Absolute: 0.2 10*3/uL (ref 0.0–0.5)
Eosinophils Relative: 1 %
HCT: 28.3 % — ABNORMAL LOW (ref 39.0–52.0)
Hemoglobin: 8.7 g/dL — ABNORMAL LOW (ref 13.0–17.0)
Immature Granulocytes: 1 %
Lymphocytes Relative: 15 %
Lymphs Abs: 2 10*3/uL (ref 0.7–4.0)
MCH: 24.2 pg — ABNORMAL LOW (ref 26.0–34.0)
MCHC: 30.7 g/dL (ref 30.0–36.0)
MCV: 78.6 fL — ABNORMAL LOW (ref 80.0–100.0)
Monocytes Absolute: 0.9 10*3/uL (ref 0.1–1.0)
Monocytes Relative: 7 %
Neutro Abs: 10.1 10*3/uL — ABNORMAL HIGH (ref 1.7–7.7)
Neutrophils Relative %: 76 %
Platelets: 537 10*3/uL — ABNORMAL HIGH (ref 150–400)
RBC: 3.6 MIL/uL — ABNORMAL LOW (ref 4.22–5.81)
RDW: 16.1 % — ABNORMAL HIGH (ref 11.5–15.5)
WBC: 13.2 10*3/uL — ABNORMAL HIGH (ref 4.0–10.5)
nRBC: 0 % (ref 0.0–0.2)

## 2020-06-21 LAB — MAGNESIUM: Magnesium: 1.6 mg/dL — ABNORMAL LOW (ref 1.7–2.4)

## 2020-06-21 LAB — RESPIRATORY PANEL BY RT PCR (FLU A&B, COVID)
Influenza A by PCR: NEGATIVE
Influenza B by PCR: NEGATIVE
SARS Coronavirus 2 by RT PCR: NEGATIVE

## 2020-06-21 LAB — PROCALCITONIN: Procalcitonin: 0.15 ng/mL

## 2020-06-21 LAB — PROTIME-INR
INR: 1.1 (ref 0.8–1.2)
Prothrombin Time: 13.5 seconds (ref 11.4–15.2)

## 2020-06-21 LAB — EXPECTORATED SPUTUM ASSESSMENT W GRAM STAIN, RFLX TO RESP C

## 2020-06-21 LAB — C-REACTIVE PROTEIN: CRP: 22.3 mg/dL — ABNORMAL HIGH (ref <1.0)

## 2020-06-21 LAB — PHOSPHORUS: Phosphorus: 3.9 mg/dL (ref 2.5–4.6)

## 2020-06-21 LAB — ETHANOL: Alcohol, Ethyl (B): <10 mg/dL (ref <10)

## 2020-06-21 LAB — FOLATE: Folate: 15.8 ng/mL (ref >5.9)

## 2020-06-21 LAB — SEDIMENTATION RATE: Sed Rate: 99 mm/hr — ABNORMAL HIGH (ref 0–16)

## 2020-06-21 LAB — HIV ANTIBODY (ROUTINE TESTING W REFLEX): HIV Screen 4th Generation wRfx: NONREACTIVE

## 2020-06-21 LAB — CORTISOL-AM, BLOOD: Cortisol - AM: 12.7 ug/dL (ref 6.7–22.6)

## 2020-06-21 LAB — LACTIC ACID, PLASMA: Lactic Acid, Venous: 0.8 mmol/L (ref 0.5–1.9)

## 2020-06-21 LAB — VITAMIN B12: Vitamin B-12: 398 pg/mL (ref 180–914)

## 2020-06-21 MED ORDER — POTASSIUM CHLORIDE CRYS ER 20 MEQ PO TBCR
40.0000 meq | EXTENDED_RELEASE_TABLET | ORAL | Status: AC
  Administered 2020-06-21 (×2): 40 meq via ORAL
  Filled 2020-06-21 (×2): qty 2

## 2020-06-21 MED ORDER — ENSURE ENLIVE PO LIQD
237.0000 mL | Freq: Two times a day (BID) | ORAL | Status: DC
  Administered 2020-06-21 – 2020-06-22 (×4): 237 mL via ORAL
  Filled 2020-06-21: qty 237

## 2020-06-21 MED ORDER — LACTATED RINGERS IV SOLN: INTRAVENOUS | Status: DC

## 2020-06-21 MED ORDER — ACETAMINOPHEN 325 MG PO TABS
650.0000 mg | ORAL_TABLET | Freq: Four times a day (QID) | ORAL | Status: DC | PRN
  Administered 2020-06-22: 650 mg via ORAL
  Filled 2020-06-21 (×2): qty 2

## 2020-06-21 MED ORDER — SODIUM CHLORIDE 0.9 % IV SOLN
1.0000 g | Freq: Three times a day (TID) | INTRAVENOUS | Status: DC
  Administered 2020-06-21 – 2020-06-27 (×19): 1 g via INTRAVENOUS
  Filled 2020-06-21 (×22): qty 1

## 2020-06-21 MED ORDER — LACTATED RINGERS IV BOLUS
1000.0000 mL | Freq: Once | INTRAVENOUS | Status: AC
  Administered 2020-06-21: 1000 mL via INTRAVENOUS

## 2020-06-21 MED ORDER — ACETAMINOPHEN 650 MG RE SUPP: 650.0000 mg | Freq: Four times a day (QID) | RECTAL | Status: DC | PRN

## 2020-06-21 MED ORDER — BENZONATATE 100 MG PO CAPS
100.0000 mg | ORAL_CAPSULE | Freq: Three times a day (TID) | ORAL | Status: DC | PRN
  Administered 2020-06-22 (×2): 100 mg via ORAL
  Filled 2020-06-21 (×2): qty 1

## 2020-06-21 MED ORDER — GUAIFENESIN-DM 100-10 MG/5ML PO SYRP
5.0000 mL | ORAL_SOLUTION | Freq: Four times a day (QID) | ORAL | Status: DC | PRN
  Administered 2020-06-21: 5 mL via ORAL
  Filled 2020-06-21 (×2): qty 5

## 2020-06-21 MED ORDER — GUAIFENESIN ER 600 MG PO TB12
600.0000 mg | ORAL_TABLET | Freq: Two times a day (BID) | ORAL | Status: DC
  Administered 2020-06-21 – 2020-06-28 (×15): 600 mg via ORAL
  Filled 2020-06-21 (×15): qty 1

## 2020-06-21 MED ORDER — NICOTINE 14 MG/24HR TD PT24
14.0000 mg | MEDICATED_PATCH | Freq: Every day | TRANSDERMAL | Status: DC
  Administered 2020-06-21 – 2020-06-28 (×8): 14 mg via TRANSDERMAL
  Filled 2020-06-21 (×8): qty 1

## 2020-06-21 MED ORDER — HYDRALAZINE HCL 20 MG/ML IJ SOLN: 10.0000 mg | Freq: Four times a day (QID) | INTRAMUSCULAR | Status: DC | PRN

## 2020-06-21 MED ORDER — FERROUS SULFATE 325 (65 FE) MG PO TABS
325.0000 mg | ORAL_TABLET | Freq: Three times a day (TID) | ORAL | Status: DC
  Administered 2020-06-21 – 2020-06-28 (×21): 325 mg via ORAL
  Filled 2020-06-21 (×21): qty 1

## 2020-06-21 MED ORDER — MAGNESIUM SULFATE 2 GM/50ML IV SOLN
2.0000 g | Freq: Once | INTRAVENOUS | Status: AC
  Administered 2020-06-21: 2 g via INTRAVENOUS
  Filled 2020-06-21: qty 50

## 2020-06-21 MED ORDER — POLYETHYLENE GLYCOL 3350 17 G PO PACK: 17.0000 g | PACK | Freq: Every day | ORAL | Status: DC | PRN

## 2020-06-21 MED ORDER — VITAMIN B-12 1000 MCG PO TABS
1000.0000 ug | ORAL_TABLET | Freq: Every day | ORAL | Status: DC
  Administered 2020-06-21 – 2020-06-28 (×8): 1000 ug via ORAL
  Filled 2020-06-21 (×8): qty 1

## 2020-06-21 MED ORDER — ONDANSETRON HCL 4 MG PO TABS: 4.0000 mg | ORAL_TABLET | Freq: Four times a day (QID) | ORAL | Status: DC | PRN

## 2020-06-21 MED ORDER — HYDROCHLOROTHIAZIDE 25 MG PO TABS
25.0000 mg | ORAL_TABLET | Freq: Every day | ORAL | Status: DC
  Administered 2020-06-21: 25 mg via ORAL
  Filled 2020-06-21 (×2): qty 1

## 2020-06-21 MED ORDER — ALBUTEROL SULFATE HFA 108 (90 BASE) MCG/ACT IN AERS
2.0000 | INHALATION_SPRAY | RESPIRATORY_TRACT | Status: DC | PRN
  Filled 2020-06-21: qty 6.7

## 2020-06-21 MED ORDER — ONDANSETRON HCL 4 MG/2ML IJ SOLN: 4.0000 mg | Freq: Four times a day (QID) | INTRAMUSCULAR | Status: DC | PRN

## 2020-06-21 NOTE — ED Notes (Signed)
Attempted to call report, receiving nurse's phone malfunctioning, nurse will call this nurse's phone as soon as she is able

## 2020-06-21 NOTE — Plan of Care (Signed)
New Admission :

## 2020-06-21 NOTE — ED Notes (Signed)
Tele  Breakfast Ordered 

## 2020-06-21 NOTE — Consult Note (Signed)
WOC Nurse Consult Note: Reason for Consult: Right foot, medial 5th digit and lateral 4th digit full thickness wounds Wound type: neuropathics, infectious Pressure Injury POA: N/A Measurement:Bedside RN to measure and document on Nursing Flow Sheet with first dressing change today Wound bed:Red, moist Drainage (amount, consistency, odor) Moderate serous Periwound:mild erythema Dressing procedure/placement/frequency: I have provided Nursing with wound care guidance for the care of these wounds using an antimicrobial absorbent dressing (Aquacel Advantage) which contains silver ions.   If these wounds do not improve/respond, recommend consideration of a consult with Orthopedics for additional input and follow up. If wound healing and digit salvage is accomplished, patient will require orthotic for prevention of recurrence. Additionally, and I realize there are multiple priorities at this time, an MRI to rule out osteomyelitis is recommended.  WOC nursing team will not follow, but will remain available to this patient, the nursing and medical teams.  Please re-consult if needed. Thanks, Ladona Mow, MSN, RN, GNP, [place]  Pager# [phone]

## 2020-06-21 NOTE — Plan of Care (Signed)

## 2020-06-21 NOTE — Consult Note (Signed)
NAME:  Kirk CotaJohnny Carrillo, MRN:  [id number], DOB:  1961-03-13, LOS: 0 ADMISSION DATE:  06/20/2020, CONSULTATION DATE:  06/21/2020 REFERRING MD:  Dr. Allena KatzPatel, CHIEF COMPLAINT:  Loculated pleural effusion  Brief History   [age] yoM with hx of tobacco abuse and HTN presenting with foot wound additionally reporting cough, previously productive, with recent weight loss, exertional dyspnea, and night sweats.  CXR found abnormal, CT chest showing multiple right sided pulmonary cavitations with loculated pleural effusion.    History of present illness   59 year old male with prior history of HTN and tobacco abuse who [facility]  right food wounds, between his right fourth and fifth toes, which developed over the last several weeks after recently wearing tight fitting shoes.  Found to have a leukocytosis and was unable to be seen by podiatry, therefore sent to ER for further evaluation.  While in ER, patient also complained of worsening cough over the last 6 months with productive sputum with associated 20 lb unintentional weight loss.    Patient has never seen a pulmonologist.  Smoked cigarettes for the last 42 years, up to 1.5 ppd at his heaviest, recently down to 0.5 ppd; no vaping history.  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  Reports history of exposures to chemicals at work and while he worked in [employer].  No pets or work around pets, hot tubs, or mold exposure. He is fully vaccinated against COVID 19.   He reports always having a "smoker's cough" but over the last several months had become productive with foul smelling sputum and occasional blood tinged but now has changed to a dry cough.  Additionally, he feels like he has developed exertional shortness of breath.  Denies fever, syncope, or leg edema  Has been having night sweats in which he has to change his clothes. Denies recent or international travel, IVDA, incarceration, close contacts  with diagnosed tuberculosis, or sick exposures.  Sometimes has post-tussive emesis but denies any dysphagia or choking episodes.  No recent bloody sputum or change in stools.  He does take three aspirin a day because he heard it was good for you.  Occasional ETOH use.   He was afebrile, but tachycardic and tachypneic.  Remains on room air with saturations > 94%.  Workup thus far with right foot xray without evidence of osteomyelitis.  Labs noted for Na 132, K 3.1, CL 97, normal lactic, WBC 13.2, Hgb 8.7, Platelets 537, Mag 1.6, low albumin and protein, PCT 0.15, UA with 20 ketones and high SG.  SARS 2/flu negative. CXR with patchy airspace disease throughout the right lung with small pleural effusion.  A chest CT was obtained which revealed multiple cavitary lesions throughout the right lung, predominately in the right upper lobe with associated areas of traction bronchiectasis; loculated areas of right pleural effusion concerning for empyema, overall concerning for a necrotic pneumonia with empyema versus underlying malignancy.  Cultures sent and empirically placed on meropenem and vancomycin and airborne precautions.  AFB and quantiferon pending.  [facility]  Pulmonary consulted for further recommendations.    Past Medical History  HTN, tobacco abuse, "PVD" per patient  Significant Hospital Events   11/20 Admitted [facility]  Consults:  Pulmonary 11/20  Procedures:   Significant Diagnostic Tests:  11/19 R foot XR >> neg for acute process 11/19 CXR >>Volume loss with patchy airspace disease throughout the right lung. Cannot exclude pneumonia. Small  right pleural effusion.  11/19 CT chest w/ contrast >> 1. Multiple areas of cavitation with extensive reticular change and architectural distortion throughout the right lung, predominantly in the right upper lobe with associated areas of traction bronchiectasis. Several areas of cavitation appear more thick-walled and peripherally enhancing  containing a admixed fluid and foci of gas concerning for superinfection and or abscess formation.  Loculated areas of right pleural effusion visceral and parietal pleural thickening worrisome for empyema as well. While this appearance could reflect sequela of a necrotic pneumonia with empyema, underlying malignancy is not fully excluded. 2. Mediastinal and right hilar adenopathy, possibly reactive or metastatic. 3. Circumferential thickening and mucosal hyperemia of the distal esophagus, correlate for features of esophagitis. Consider further evaluation with direct visualization. 4. Small sliding-type hiatal hernia with distal esophageal thickening and mucosal hyperemia, correlate for features of reflux. 5. Three-vessel coronary artery atherosclerosis. 6. Aortic Atherosclerosis   Micro Data:  11/19 SARS 2/ flu >> neg 11/19 BCx2 >> 11/20 expectorated sputum >>  AFB >>  MTB RIF >>  Antimicrobials:  11/19 meropenem  11/19 zosyn 11/19 vancomycin >>  Interim history/subjective:  No current complaints.   Objective   Blood pressure 124/67, pulse 83, temperature 99.1 F (37.3 C), temperature source Oral, resp. rate 18, height 5' 7.5" (1.715 m), weight 56.1 kg, SpO2 97 %.        Intake/Output Summary (Last 24 hours) at 06/21/2020 1123 Last data filed at 06/21/2020 1116 Gross per 24 hour  Intake --  Output 300 ml  Net -300 ml   Filed Weights   06/20/20 2200  Weight: 56.1 kg   Examination: seen in airborne precautions General:  Thin adult male sitting in bed in NAD HEENT: MM pink/moist, pupils 4/reactive, anicteric  Neuro: AOx 4, MAE CV: rr, no murmur PULM:  Non labored, dry cough with deep breathing, right with scattered rhonchi/ rales, left clear GI: soft, bs active  Extremities: warm/dry, no LE edema, right 4/5 digital ulceration Skin: no rashes       Right posterior lung bedside ultrasound   Resolved Hospital Problem list    Assessment & Plan:   Multiple right  sided pulmonary cavitations and Right loculated pleural effusion concerning for infectious etiology vs malignancy vs TB - PCT 0.15, remains afebrile  P:  HIV neg Pending MTB PCR, quantiferon, and AFB Continue airborne precautions Follow culture data Continue meropenum and vancomycin for now Repeat PCT in am  Bedside ultrasound with no good window for diagnostic thoracentesis- multiple septations; will consult IR for thoracentesis.  Please send pleural fluid for culture, gram stain, cell count with differential, cytology, LDH and protein  Supplemental O2 if needed, currently on room air Albuterol prn   Pulmonary will continue to follow. Remainder per primary team  Best practice:  Diet: per primary  Pain/Anxiety/Delirium protocol (if indicated): n/a VAP protocol (if indicated): n/a DVT prophylaxis: SCDs GI prophylaxis: n/a Glucose control: trend on BMET Mobility: as tolerated  Code Status: full  Family Communication: patient and patient's wife updated at bedside  Disposition: telemetry   Labs   CBC: Recent Labs  Lab 06/20/20 2134 06/21/20 0500  WBC 19.1* 13.2*  NEUTROABS 15.1* 10.1*  HGB 9.6* 8.7*  HCT 30.7* 28.3*  MCV 79.5* 78.6*  PLT 673* 537*    Basic Metabolic Panel: Recent Labs  Lab 06/20/20 2134 06/21/20 0500  NA 129* 132*  K 3.6 3.1*  CL 89* 97*  CO2 25 25  GLUCOSE 88 91  BUN 11 8  CREATININE 0.69 0.50*  CALCIUM 8.9 8.1*  MG  --  1.6*  PHOS  --  3.9   GFR: Estimated Creatinine Clearance: 76.9 mL/min (A) (by C-G formula based on SCr of 0.5 mg/dL (L)). Recent Labs  Lab 06/20/20 2134 06/20/20 2147 06/20/20 2319 06/21/20 0500  PROCALCITON  --   --   --  0.15  WBC 19.1*  --   --  13.2*  LATICACIDVEN  --  1.0 0.8  --     Liver Function Tests: Recent Labs  Lab 06/20/20 2134 06/21/20 0500  AST 22 22  ALT 20 18  ALKPHOS 86 67  BILITOT 1.4* 0.7  PROT 7.5 6.1*  ALBUMIN 2.3* 1.8*   Recent Labs  Lab 06/20/20 2134  LIPASE 19   No results  for input(s): AMMONIA in the last 168 hours.  ABG No results found for: PHART, PCO2ART, PO2ART, HCO3, TCO2, ACIDBASEDEF, O2SAT   Coagulation Profile: Recent Labs  Lab 06/21/20 0500  INR 1.1    Cardiac Enzymes: No results for input(s): CKTOTAL, CKMB, CKMBINDEX, TROPONINI in the last 168 hours.  HbA1C: No results found for: HGBA1C  CBG: No results for input(s): GLUCAP in the last 168 hours.  Review of Systems:   Review of Systems  Constitutional: Positive for malaise/fatigue and weight loss. Negative for chills and fever.  Respiratory: Positive for cough. Negative for hemoptysis, sputum production, shortness of breath and wheezing.        Cough not recently productive Exertional dyspnea   Cardiovascular: Negative for chest pain, palpitations, orthopnea and leg swelling.  Gastrointestinal: Negative for abdominal pain, blood in stool, heartburn, nausea and vomiting.  Neurological: Negative for focal weakness, loss of consciousness and weakness.   Right foot wound.  As per HPI otherwise negative.  Past Medical History  He,  has a past medical history of Hypertension and Kidney stone.   Surgical History   History reviewed. No pertinent surgical history.   Social History   reports that he has been smoking. He has been smoking about 0.50 packs per day. He has never used smokeless tobacco. He reports current alcohol use of about 35.0 standard drinks of alcohol per week. He reports that he does not use drugs.   Family History   His family history includes Breast cancer in his mother.   Allergies Allergies  Allergen Reactions   Amlodipine Swelling   Telmisartan Other (See Comments)    Facial; angioedema.      Home Medications  Prior to Admission medications   Medication Sig Start Date End Date Taking? Authorizing Provider  diphenhydrAMINE (BENADRYL) 25 mg capsule Take 25 mg by mouth in the morning, at noon, and at bedtime.   Yes [provider]  guaifenesin  (ROBITUSSIN) 100 MG/5ML syrup Take 200 mg by mouth 2 times daily at 12 noon and 4 pm.   Yes [provider]  hydrochlorothiazide (HYDRODIURIL) 25 MG tablet Take 1 tablet (25 mg total) by mouth daily. 08/06/12  Yes Earley Favor, NP  ibuprofen (ADVIL) 200 MG tablet Take 400 mg by mouth every 6 (six) hours as needed for moderate pain.   Yes [provider]  albuterol (PROAIR HFA) 108 (90 Base) MCG/ACT inhaler Inhale 1-2 puffs into the lungs every 6 (six) hours as needed for wheezing or shortness of breath. Patient not taking: Reported on 06/21/2020 02/15/20   Georgetta Haber, NP  benzonatate (TESSALON) 100 MG capsule Take 1-2 capsules (100-200 mg total) by mouth 3 (three) times daily as  needed for cough. Patient not taking: Reported on 06/21/2020 02/15/20   Georgetta Haber, NP      Posey Boyer, ACNP [facility] 06/21/2020, 3:37 PM  See Loretha Stapler for personal pager PCCM on call pager [phone]

## 2020-06-21 NOTE — H&P (Addendum)
History and Physical    Kirk CotaJohnny Carrillo ZOX:[id number] DOB: Apr 13, 1961 DOA: 06/20/2020  PCP: Pcp, No  Patient coming from: Home   Chief Complaint:  Chief Complaint  Patient presents with  . Foot Pain     HPI:   59 year old male with past medical history of hypertension, nicotine dependence who [facility] after being sent from a local [facility] for ulcerations of the fourth and fifth toes.  Except for approximately the past 1 week he has been experiencing increasing pain of the right foot.  This pain is sore in quality and was initially mild in intensity.  Pain is progressively gotten worse over the past several days, is nonradiating and worse with weightbearing.  Patient proceeded to look at his foot and noticed that he was developing ulcerations in between the fourth and fifth toes.  Due to worsening symptoms the patient eventually presented to a [facility]  Patient was initially prescribed a 7-day course of doxycycline and had a podiatry referral placed but after this was done the provider recommended that the patient come to the emergency department instead.  Upon further questioning, patient also complains of worsening cough over the past 6 months.  Patient's cough initially was mild but has progressively become more and more severe.  Patient explains that his cough used to be productive of extremely foul-smelling sputum.  This foul-smelling sputum also was occasionally bloody.  As the months progressed, the color smell and quantity of the sputum changed.  As the patient symptoms have persisted patient has developed such severe coughing that he occasionally feels like he is choking on his food.  Patient is also begun to exhibit a 20 pound unintentional weight loss over the span of time.  Patient denies any international travel, close contact with anyone diagnosed with  tuberculosis, history of intravenous drug abuse or incarceration.  Patient denies fevers.  Upon evaluation in the emergency department patient initially had an x-ray of the right foot performed which revealed no evidence of osteomyelitis.  Initial laboratory work revealed substantial leukocytosis in addition to other SIRS criteria such as tachycardia concerning for early sepsis.  Chest x-ray was obtained revealing multiple right-sided pulmonary infiltrates and therefore the emergency department provider ordered CT imaging of the chest with contrast.  This revealed multiple cavitary lesions throughout the right lung with possible concurrent empyema.  Patient was placed on broad-spectrum intravenous antibiotic therapy and the hospitalist group was then called to assess the patient for mission the hospital.  Review of Systems:   Review of Systems  Constitutional: Positive for malaise/fatigue and weight loss.  Respiratory: Positive for cough, hemoptysis and sputum production.   All other systems reviewed and are negative.   Past Medical History:  Diagnosis Date  . Hypertension   . Kidney stone     History reviewed. No pertinent surgical history.   reports that he has been smoking. He has been smoking about 0.50 packs per day. He has never used smokeless tobacco. He reports current alcohol use of about 35.0 standard drinks of alcohol per week. He reports that he does not use drugs.  Allergies  Allergen Reactions  . Amlodipine Swelling  . Telmisartan Other (See Comments)    Facial; angioedema.     Family History  Problem Relation Age of Onset  . Breast cancer Mother      Prior to Admission medications   Medication Sig Start Date End  Date Taking? Authorizing Provider  diphenhydrAMINE (BENADRYL) 25 mg capsule Take 25 mg by mouth in the morning, at noon, and at bedtime.   Yes [provider]  guaifenesin (ROBITUSSIN) 100 MG/5ML syrup Take 200 mg by mouth 2 times daily at 12  noon and 4 pm.   Yes [provider]  hydrochlorothiazide (HYDRODIURIL) 25 MG tablet Take 1 tablet (25 mg total) by mouth daily. 08/06/12  Yes Earley Favor, NP  ibuprofen (ADVIL) 200 MG tablet Take 400 mg by mouth every 6 (six) hours as needed for moderate pain.   Yes [provider]  albuterol (PROAIR HFA) 108 (90 Base) MCG/ACT inhaler Inhale 1-2 puffs into the lungs every 6 (six) hours as needed for wheezing or shortness of breath. Patient not taking: Reported on 06/21/2020 02/15/20   Georgetta Haber, NP  benzonatate (TESSALON) 100 MG capsule Take 1-2 capsules (100-200 mg total) by mouth 3 (three) times daily as needed for cough. Patient not taking: Reported on 06/21/2020 02/15/20   Georgetta Haber, NP    Physical Exam: Vitals:   06/20/20 2200 06/20/20 2226 06/21/20 0013 06/21/20 0200  BP:  132/84 (!) 151/71 136/79  Pulse:  100 (!) 113 (!) 101  Resp:  18  (!) 22  Temp:   99.5 F (37.5 C) 99.1 F (37.3 C)  TempSrc:   Oral Oral  SpO2:  96% 95% 96%  Weight: 56.1 kg     Height: 5' 7.5" (1.715 m)       Constitutional: Acute alert and oriented x3, no associated distress.  Patient is cachectic. Skin: no rashes, no lesions, notable poor skin turgor noted. Eyes: Pupils are equally reactive to light.  No evidence of scleral icterus or conjunctival pallor.  ENMT: Notable poor dentition.  Moist mucous membranes noted.  Posterior pharynx clear of any exudate or lesions.   Neck: normal, supple, no masses, no thyromegaly.  No evidence of jugular venous distension.   Respiratory: Admission breath sounds throughout the right mid and lower fields with associated rales.  Increased dullness to percussion also noted.  No evidence of wheezing.. Normal respiratory effort. No accessory muscle use.  Cardiovascular: Regular rate and rhythm, no murmurs / rubs / gallops. No extremity edema. 2+ pedal pulses. No carotid bruits.  Chest:   Nontender without crepitus or deformity.   Back:    Nontender without crepitus or deformity. Abdomen: Abdomen is soft and nontender.  No evidence of intra-abdominal masses.  Positive bowel sounds noted in all quadrants.   Musculoskeletal: No joint deformity upper and lower extremities. Good ROM, no contractures. Normal muscle tone.  Neurologic: CN 2-12 grossly intact. Sensation intact.  Patient moving all 4 extremities spontaneously.  Patient is following all commands.  Patient is responsive to verbal stimuli.   Psychiatric: Patient exhibits normal mood with appropriate affect.  Patient seems to possess insight as to their current situation.     Labs on Admission: I have personally reviewed following labs and imaging studies -   CBC: Recent Labs  Lab 06/20/20 2134  WBC 19.1*  NEUTROABS 15.1*  HGB 9.6*  HCT 30.7*  MCV 79.5*  PLT 673*   Basic Metabolic Panel: Recent Labs  Lab 06/20/20 2134  NA 129*  K 3.6  CL 89*  CO2 25  GLUCOSE 88  BUN 11  CREATININE 0.69  CALCIUM 8.9   GFR: Estimated Creatinine Clearance: 76.9 mL/min (by C-G formula based on SCr of 0.69 mg/dL). Liver Function Tests: Recent Labs  Lab 06/20/20 2134  AST 22  ALT 20  ALKPHOS 86  BILITOT 1.4*  PROT 7.5  ALBUMIN 2.3*   Recent Labs  Lab 06/20/20 2134  LIPASE 19   No results for input(s): AMMONIA in the last 168 hours. Coagulation Profile: No results for input(s): INR, PROTIME in the last 168 hours. Cardiac Enzymes: No results for input(s): CKTOTAL, CKMB, CKMBINDEX, TROPONINI in the last 168 hours. BNP (last 3 results) No results for input(s): PROBNP in the last 8760 hours. HbA1C: No results for input(s): HGBA1C in the last 72 hours. CBG: No results for input(s): GLUCAP in the last 168 hours. Lipid Profile: No results for input(s): CHOL, HDL, LDLCALC, TRIG, CHOLHDL, LDLDIRECT in the last 72 hours. Thyroid Function Tests: No results for input(s): TSH, T4TOTAL, FREET4, T3FREE, THYROIDAB in the last 72 hours. Anemia Panel: No results for  input(s): VITAMINB12, FOLATE, FERRITIN, TIBC, IRON, RETICCTPCT in the last 72 hours. Urine analysis:    Component Value Date/Time   COLORURINE YELLOW 06/21/2020 0116   APPEARANCEUR CLEAR 06/21/2020 0116   LABSPEC 1.038 (H) 06/21/2020 0116   PHURINE 6.0 06/21/2020 0116   GLUCOSEU NEGATIVE 06/21/2020 0116   HGBUR NEGATIVE 06/21/2020 0116   BILIRUBINUR NEGATIVE 06/21/2020 0116   KETONESUR 20 (A) 06/21/2020 0116   PROTEINUR NEGATIVE 06/21/2020 0116   UROBILINOGEN 0.2 08/06/2012 2125   NITRITE NEGATIVE 06/21/2020 0116   LEUKOCYTESUR NEGATIVE 06/21/2020 0116    Radiological Exams on Admission - Personally Reviewed: DG Chest 2 View  Result Date: 06/20/2020 CLINICAL DATA:  Chronic cough, weight loss EXAM: CHEST - 2 VIEW COMPARISON:  None. FINDINGS: Heart is normal size. Patchy airspace disease throughout the right lung. Small right pleural effusion. Left lung clear. No acute bony abnormality. IMPRESSION: Volume loss with patchy airspace disease throughout the right lung. Cannot exclude pneumonia. Small right pleural effusion. Electronically Signed   By: Charlett Nose M.D.   On: 06/20/2020 21:33   CT Chest W Contrast  Addendum Date: 06/21/2020   ADDENDUM REPORT: 06/21/2020 00:39 ADDENDUM: These results were called by telephone at the time of interpretation on 06/21/2020 at 12:38 am to provider Dr Martyn Malay, who verbally acknowledged these results. Electronically Signed   By: Kreg Shropshire M.D.   On: 06/21/2020 00:39   Result Date: 06/21/2020 CLINICAL DATA:  Abnormal radiograph, chronic cough, EXAM: CT CHEST WITH CONTRAST TECHNIQUE: Multidetector CT imaging of the chest was performed during intravenous contrast administration. CONTRAST:  98mL OMNIPAQUE IOHEXOL 300 MG/ML  SOLN COMPARISON:  Same-day radiograph, CT abdomen pelvis 08/06/2012 FINDINGS: Cardiovascular: Cardiac size within normal limits. Three-vessel coronary artery atherosclerosis. There is a questionable region of direct involvement of  the pericardium by the cavitary lung masses along the posterior aspect of the right atrium (3/96) with distinct loss of fat plane and some pericardial fat stranding. Atherosclerotic plaque within the normal caliber aorta. No acute luminal abnormality of the imaged aorta. No periaortic stranding or hemorrhage. Normal 3 vessel branching of the aortic arch. Proximal great vessels are calcified at the ostia but otherwise unremarkable. Central pulmonary arteries are normal caliber. There is some focal narrowing no large central filling defects are identified. More distal evaluation is limited by a non tailored technique and mild respiratory motion artifact. Mediastinum/Nodes: Mediastinal and right hilar adenopathy. Index nodes include 14 mm short axis precarinal lymph node (3/56) 15 mm short axis node versus soft tissue mass in the right hilum(3/59) 14 mm short axis high right paratracheal node (3/38) 13 mm short axis subcarinal node (3/70) Diffuse airways thickening throughout the  central and more distal airways of the right lung. Soft tissue contiguous with the right paramediastinal border. Distal thoracic esophageal thickening and some mucosal hyperemia with a small sliding-type hiatal hernia. Thyroid gland and thoracic inlet are unremarkable. Lungs/Pleura: There are extensive areas of reticular change and architectural distortion throughout the right lung. Mixed areas of cavitation are present within this architectural distortion as well as other more thick-walled peripherally enhancing regions of cavitation containing air and heterogeneous material concerning for superinfection or abscess formation. Largest is seen in the right lower lobe abutting the fissure and measuring up 5.7 x 5.1 x 4.9 cm (6/95) additional spiculated centrally hypoattenuating, possibly necrotic nodule seen in the right middle lobe measuring 2.2 x 2.2 x 1.8 cm (5/76). There is loculated right pleural effusion with marked visceral and parietal  pleural thickening with the largest collection in the right lung base. Additional lobular fluid and pleural thickening along the periphery of the right lung and anteriorly. No clear extension into or through the chest wall. Left lung is predominantly clear aside from few scattered calcified granulomata. Upper Abdomen: Atherosclerotic calcifications in the upper abdomen. Distal esophageal thickening and hiatal hernia, as above. No other acute abnormalities in the upper abdomen. Musculoskeletal: Mild levocurvature of the lower thoracic spine, apex T11. Additional mild multilevel degenerative changes in the spine and shoulders. No acute or worrisome osseous lesion. IMPRESSION: 1. Multiple areas of cavitation with extensive reticular change and architectural distortion throughout the right lung, predominantly in the right upper lobe with associated areas of traction bronchiectasis. Several areas of cavitation appear more thick-walled and peripherally enhancing containing a admixed fluid and foci of gas concerning for superinfection and or abscess formation. Loculated areas of right pleural effusion visceral and parietal pleural thickening worrisome for empyema as well. While this appearance could reflect sequela of a necrotic pneumonia with empyema, underlying malignancy is not fully excluded. 2. Mediastinal and right hilar adenopathy, possibly reactive or metastatic. 3. Circumferential thickening and mucosal hyperemia of the distal esophagus, correlate for features of esophagitis. Consider further evaluation with direct visualization. 4. Small sliding-type hiatal hernia with distal esophageal thickening and mucosal hyperemia, correlate for features of reflux. 5. Three-vessel coronary artery atherosclerosis. 6. Aortic Atherosclerosis (ICD10-I70.0). Electronically Signed: By: Kreg Shropshire M.D. On: 06/21/2020 00:24   DG Foot Complete Right  Result Date: 06/20/2020 CLINICAL DATA:  Deep wound lateral 4th and 5th toes.  EXAM: RIGHT FOOT COMPLETE - 3+ VIEW COMPARISON:  None. FINDINGS: No acute bony abnormality. Specifically, no fracture, subluxation, or dislocation. No bone destruction to suggest osteomyelitis. No soft tissue gas. Joint spaces maintained. IMPRESSION: No acute bony abnormality. Electronically Signed   By: Charlett Nose M.D.   On: 06/20/2020 21:32    Telemetry: Personally reviewed.  Rhythm is normal sinus rhythm with heart rate of 105 bpm.  No dynamic ST segment changes appreciated.  Assessment/Plan Principal Problem:   Sepsis due to pneumonia [facility])   Patient is exhibiting multiple SIRS criteria including tachycardia and leukocytosis concerning for early sepsis in the setting of extensive right-sided cavitary pneumonia  Hydrating patient aggressively with intravenous isotonic fluids  Blood cultures and sputum cultures have been obtained  Additionally testing patient for possible tuberculosis although this is unlikely  Intravenous antibiotic regimen to include coverage for anaerobic organisms and MRSA considering presence of possible empyema and cavitary lesions  After obtaining MTB PCR sample if negative we will then proceed with CT surgery consultation for evaluation of right-sided empyema.  Active Problems:   Pneumonia of  right lung due to infectious organism   Extensive right-sided pneumonia with multiple cavitary lesions and possible empyema noted on CT imaging of the chest  Per my discussion with radiology, tuberculosis is also a possibility as well as malignancy.  Considering patient's 57-month history of symptoms with occasional hemoptysis and unintentional weight loss will be placing patient in negative pressure airborne isolation and performing MTB PCR testing  Once tuberculosis has been ruled out right-sided effusion and likely empyema will need to be evaluated by cardiothoracic surgery  Treating patient is returned with broad-spectrum intravenous antibiotic therapy including  cefepime Flagyl and vancomycin for coverage of MRSA and anaerobic organisms  Blood cultures have been obtained    Empyema of right pleural space [facility])   CT imaging brings up concern for possible empyema which would require CT surgery consultation  Completing a work-up of possible tuberculosis prior to proceeding with this.    Essential hypertension   Continue home regimen of hydrochlorothiazide  Anemia   Possibly of chronic disease  Obtaining iron panel, folate, vitamin B12  Monitoring hemoglobin and hematocrit with serial CBCs.    Alcohol abuse   Patient reports drinking approximately 5 beers daily although seems to minimize his alcohol use.  No history of withdrawal symptoms.  Will monitor closely for any evidence of withdrawal.  CIWA protocol has been ordered to monitor for withdrawal symptoms.  If patient begins to develop any evidence of withdrawal as needed benzodiazepines can then be initiated.    Ulcer of toe, right, with fat layer exposed [facility])   Patient actually initially presented to the local urgent care clinic and eventually our emergency department with concerns about pain in his right fourth and fifth toes.  There is a notable ulceration noted on both the fourth and fifth toes with fat layer exposed.  No evidence of foul smell, no evidence of significant surrounding cellulitis.  Regardless, antibiotic regimen the patient is currently being placed on will cover for any infection related to these wounds anyway.  Will apply Betadine soaked gauze for now, obtain wound care evaluation in the morning.  Right foot x-ray performed in the emergency department negative for osteomyelitis.  Patient has known history of peripheral vascular disease and therefore these ulcerations are likely related to that.  Patient would likely benefit from outpatient vascular surgery and podiatry evaluations.    Nicotine dependence, cigarettes, uncomplicated  Counseling patient on  cessation daily  Daily nicotine replacement therapy ordered.    Code Status:  Full code Family Communication: Wife is at bedside and has been updated on plan of care.  Status is: Inpatient  Remains inpatient appropriate because:IV treatments appropriate due to intensity of illness or inability to take PO and Inpatient level of care appropriate due to severity of illness   Dispo: The patient is from: Home              Anticipated d/c is to: Home              Anticipated d/c date is: 3 days              Patient currently is not medically stable to d/c.        Marinda Elk MD [facility] Pager [phone]  If 7PM-7AM, please contact night-coverage [email or web address] Use universal [place] password for that web site. If you do not have the password, please call the hospital operator.  06/21/2020, 2:13 AM

## 2020-06-21 NOTE — Progress Notes (Signed)
[facility] Progress Note  Patient: Kirk Carrillo    WUJ:[id number]  DOA: 06/20/2020     Date of Service: the patient was seen and examined on 06/21/2020  Brief hospital course: PMH of HT, active smoker, alcohol use without abuse presents with right leg pain and found to have multifocal necrotic lung cavities. PCCM consulted currently on Antibiotics.   Currently plan is further work up.  Assessment and Plan: 1. Multiple right sided pulmonary cavitations and Right loculated pleural effusion  Sepsis POA due to pneumonia Weight loss Night sweats.  procal mildly elevated. No fever or chills.  covid negative.  X ray chest shows right sided pneumonia.  CT chest shows multiple areas of necrosis and possible abscess along with loculated pleural effusion on right.  Pt refuses chocking or ETOH abuse. Pt refused any incarceration or travel to high endemic TB areas. Denies any immunosuppressive meds. Also denies significant alcohol abuse.  On vancomycin and meropenem.  Follow up on culture.  Since the differential includes TB will check AFB smear and quantiferon TB gold.  Airborne isolation.  PCCM consulted. Appreciate input.  May need drainage of loculated pleural effusion.  Pt reports significant weight loss and drenching night sweats needing to change clothes.  2. Hypokalemia , hypomagnesemia  K low, Mg low. will replace.   3. Hyponatremia, from poor PO intake Sodium improving with hydration. Monitor.  4. Iron deficiency anemia  Iron low.,will start oral iron. Not giving IV iron due to concerns with infection   5. b12 deficiency  replace  6. Moderate protein calorie malnutrition Add supplements   7.  PVD. Right leg ulcer on fourth and fifth toe. Left trophic ulcer due to pressure between the toes with worsening likely secondary to PVD. We will check ABI. Currently continue dressing changes. If there is no improvement will consider vascular surgery involvement. No  evidence of osteomyelitis on x-ray.  Currently no indication for MRI.  Body mass index is 19.08 kg/m.    Interventions:        Diet: regular diet DVT Prophylaxis:   SCDs Start: 06/21/20 0137    Advance goals of care discussion: Full code  Family Communication: family was present at bedside, at the time of interview.  The pt provided permission to discuss medical plan with the family. Opportunity was given to ask question and all questions were answered satisfactorily.   Disposition:  Status is: Inpatient  Remains inpatient appropriate because:Ongoing diagnostic testing needed not appropriate for outpatient work up and IV treatments appropriate due to intensity of illness or inability to take PO   Dispo: The patient is from: Home              Anticipated d/c is to: Home              Anticipated d/c date is: 3 days              Patient currently is not medically stable to d/c.        Subjective: no acute complains  No fever no chills ,no chest pain pain in the leg still present  Physical Exam:  General: Appear in mild distress, no Rash; Oral Mucosa Clear, moist. no Abnormal Neck Mass Or lumps, Conjunctiva normal  Cardiovascular: S1 and S2 Present, no Murmur, Respiratory: good respiratory effort, Bilateral Air entry present and CTA, no Crackles, no wheezes Abdomen: Bowel Sound present, Soft and no tenderness Extremities: no Pedal edema Neurology: alert and oriented to time, place, and person affect  appropriate. no new focal deficit Gait not checked due to patient safety concerns  Vitals:   06/21/20 1200 06/21/20 1300 06/21/20 1355 06/21/20 1628  BP: 127/69 114/66 130/68 (!) 141/93  Pulse: 89 89 97   Resp: (!) 22 20 18 18   Temp:   99 F (37.2 C) 99.3 F (37.4 C)  TempSrc:   Oral Oral  SpO2: 95% 95% 95%   Weight:      Height:        Intake/Output Summary (Last 24 hours) at 06/21/2020 1721 Last data filed at 06/21/2020 1246 Gross per 24 hour  Intake 50 ml    Output 300 ml  Net -250 ml   Filed Weights   06/20/20 2200  Weight: 56.1 kg    Data Reviewed: I have personally reviewed and interpreted daily labs, tele strips, imagings as discussed above. I reviewed all nursing notes, pharmacy notes, vitals, pertinent old records I have discussed plan of care as described above with RN and patient/family.  CBC: Recent Labs  Lab 06/20/20 2134 06/21/20 0500  WBC 19.1* 13.2*  NEUTROABS 15.1* 10.1*  HGB 9.6* 8.7*  HCT 30.7* 28.3*  MCV 79.5* 78.6*  PLT 673* 537*   Basic Metabolic Panel: Recent Labs  Lab 06/20/20 2134 06/21/20 0500  NA 129* 132*  K 3.6 3.1*  CL 89* 97*  CO2 25 25  GLUCOSE 88 91  BUN 11 8  CREATININE 0.69 0.50*  CALCIUM 8.9 8.1*  MG  --  1.6*  PHOS  --  3.9    Studies: DG Chest 2 View  Result Date: 06/20/2020 CLINICAL DATA:  Chronic cough, weight loss EXAM: CHEST - 2 VIEW COMPARISON:  None. FINDINGS: Heart is normal size. Patchy airspace disease throughout the right lung. Small right pleural effusion. Left lung clear. No acute bony abnormality. IMPRESSION: Volume loss with patchy airspace disease throughout the right lung. Cannot exclude pneumonia. Small right pleural effusion. Electronically Signed   By: 06/22/2020 M.D.   On: 06/20/2020 21:33   CT Chest W Contrast  Addendum Date: 06/21/2020   ADDENDUM REPORT: 06/21/2020 00:39 ADDENDUM: These results were called by telephone at the time of interpretation on 06/21/2020 at 12:38 am to provider Dr 06/23/2020, who verbally acknowledged these results. Electronically Signed   By: Martyn Malay M.D.   On: 06/21/2020 00:39   Result Date: 06/21/2020 CLINICAL DATA:  Abnormal radiograph, chronic cough, EXAM: CT CHEST WITH CONTRAST TECHNIQUE: Multidetector CT imaging of the chest was performed during intravenous contrast administration. CONTRAST:  60mL OMNIPAQUE IOHEXOL 300 MG/ML  SOLN COMPARISON:  Same-day radiograph, CT abdomen pelvis 08/06/2012 FINDINGS: Cardiovascular:  Cardiac size within normal limits. Three-vessel coronary artery atherosclerosis. There is a questionable region of direct involvement of the pericardium by the cavitary lung masses along the posterior aspect of the right atrium (3/96) with distinct loss of fat plane and some pericardial fat stranding. Atherosclerotic plaque within the normal caliber aorta. No acute luminal abnormality of the imaged aorta. No periaortic stranding or hemorrhage. Normal 3 vessel branching of the aortic arch. Proximal great vessels are calcified at the ostia but otherwise unremarkable. Central pulmonary arteries are normal caliber. There is some focal narrowing no large central filling defects are identified. More distal evaluation is limited by a non tailored technique and mild respiratory motion artifact. Mediastinum/Nodes: Mediastinal and right hilar adenopathy. Index nodes include 14 mm short axis precarinal lymph node (3/56) 15 mm short axis node versus soft tissue mass in the right hilum(3/59) 14 mm short  axis high right paratracheal node (3/38) 13 mm short axis subcarinal node (3/70) Diffuse airways thickening throughout the central and more distal airways of the right lung. Soft tissue contiguous with the right paramediastinal border. Distal thoracic esophageal thickening and some mucosal hyperemia with a small sliding-type hiatal hernia. Thyroid gland and thoracic inlet are unremarkable. Lungs/Pleura: There are extensive areas of reticular change and architectural distortion throughout the right lung. Mixed areas of cavitation are present within this architectural distortion as well as other more thick-walled peripherally enhancing regions of cavitation containing air and heterogeneous material concerning for superinfection or abscess formation. Largest is seen in the right lower lobe abutting the fissure and measuring up 5.7 x 5.1 x 4.9 cm (6/95) additional spiculated centrally hypoattenuating, possibly necrotic nodule seen in  the right middle lobe measuring 2.2 x 2.2 x 1.8 cm (5/76). There is loculated right pleural effusion with marked visceral and parietal pleural thickening with the largest collection in the right lung base. Additional lobular fluid and pleural thickening along the periphery of the right lung and anteriorly. No clear extension into or through the chest wall. Left lung is predominantly clear aside from few scattered calcified granulomata. Upper Abdomen: Atherosclerotic calcifications in the upper abdomen. Distal esophageal thickening and hiatal hernia, as above. No other acute abnormalities in the upper abdomen. Musculoskeletal: Mild levocurvature of the lower thoracic spine, apex T11. Additional mild multilevel degenerative changes in the spine and shoulders. No acute or worrisome osseous lesion. IMPRESSION: 1. Multiple areas of cavitation with extensive reticular change and architectural distortion throughout the right lung, predominantly in the right upper lobe with associated areas of traction bronchiectasis. Several areas of cavitation appear more thick-walled and peripherally enhancing containing a admixed fluid and foci of gas concerning for superinfection and or abscess formation. Loculated areas of right pleural effusion visceral and parietal pleural thickening worrisome for empyema as well. While this appearance could reflect sequela of a necrotic pneumonia with empyema, underlying malignancy is not fully excluded. 2. Mediastinal and right hilar adenopathy, possibly reactive or metastatic. 3. Circumferential thickening and mucosal hyperemia of the distal esophagus, correlate for features of esophagitis. Consider further evaluation with direct visualization. 4. Small sliding-type hiatal hernia with distal esophageal thickening and mucosal hyperemia, correlate for features of reflux. 5. Three-vessel coronary artery atherosclerosis. 6. Aortic Atherosclerosis (ICD10-I70.0). Electronically Signed: By: Kreg Shropshire  M.D. On: 06/21/2020 00:24   DG Foot Complete Right  Result Date: 06/20/2020 CLINICAL DATA:  Deep wound lateral 4th and 5th toes. EXAM: RIGHT FOOT COMPLETE - 3+ VIEW COMPARISON:  None. FINDINGS: No acute bony abnormality. Specifically, no fracture, subluxation, or dislocation. No bone destruction to suggest osteomyelitis. No soft tissue gas. Joint spaces maintained. IMPRESSION: No acute bony abnormality. Electronically Signed   By: Charlett Nose M.D.   On: 06/20/2020 21:32    Scheduled Meds: . feeding supplement  237 mL Oral BID BM  . ferrous sulfate  325 mg Oral TID WC  . guaiFENesin  600 mg Oral BID  . nicotine  14 mg Transdermal Daily  . vitamin B-12  1,000 mcg Oral Daily   Continuous Infusions: . lactated ringers 125 mL/hr at 06/21/20 1029  . meropenem (MERREM) IV Stopped (06/21/20 0511)  . vancomycin Stopped (06/21/20 1026)   PRN Meds: acetaminophen **OR** acetaminophen, albuterol, benzonatate, hydrALAZINE, ondansetron **OR** ondansetron (ZOFRAN) IV, polyethylene glycol  Time spent: 35 minutes  Author: Lynden Oxford, MD [facility] 06/21/2020 5:21 PM  To reach On-call, see care teams to locate the attending  and reach out via [email or web address]. Between 7PM-7AM, please contact night-coverage If you still have difficulty reaching the attending provider, please page the [facility] (Director on Call) for [facility] on amion for assistance.

## 2020-06-21 NOTE — ED Notes (Signed)
Admitting MD at bedside.

## 2020-06-22 ENCOUNTER — Inpatient Hospital Stay (HOSPITAL_COMMUNITY): Payer: BLUE CROSS/BLUE SHIELD

## 2020-06-22 LAB — C-REACTIVE PROTEIN: CRP: 17.2 mg/dL — ABNORMAL HIGH (ref <1.0)

## 2020-06-22 LAB — CBC WITH DIFFERENTIAL/PLATELET
Abs Immature Granulocytes: 0.07 10*3/uL (ref 0.00–0.07)
Basophils Absolute: 0 10*3/uL (ref 0.0–0.1)
Basophils Relative: 0 %
Eosinophils Absolute: 0.1 10*3/uL (ref 0.0–0.5)
Eosinophils Relative: 1 %
HCT: 26 % — ABNORMAL LOW (ref 39.0–52.0)
Hemoglobin: 8.1 g/dL — ABNORMAL LOW (ref 13.0–17.0)
Immature Granulocytes: 1 %
Lymphocytes Relative: 15 %
Lymphs Abs: 2.1 10*3/uL (ref 0.7–4.0)
MCH: 24.2 pg — ABNORMAL LOW (ref 26.0–34.0)
MCHC: 31.2 g/dL (ref 30.0–36.0)
MCV: 77.6 fL — ABNORMAL LOW (ref 80.0–100.0)
Monocytes Absolute: 0.8 10*3/uL (ref 0.1–1.0)
Monocytes Relative: 6 %
Neutro Abs: 10.5 10*3/uL — ABNORMAL HIGH (ref 1.7–7.7)
Neutrophils Relative %: 77 %
Platelets: 433 10*3/uL — ABNORMAL HIGH (ref 150–400)
RBC: 3.35 MIL/uL — ABNORMAL LOW (ref 4.22–5.81)
RDW: 15.8 % — ABNORMAL HIGH (ref 11.5–15.5)
WBC: 13.6 10*3/uL — ABNORMAL HIGH (ref 4.0–10.5)
nRBC: 0 % (ref 0.0–0.2)

## 2020-06-22 LAB — CULTURE, BLOOD (ROUTINE X 2)

## 2020-06-22 LAB — COMPREHENSIVE METABOLIC PANEL
ALT: 23 U/L (ref 0–44)
AST: 23 U/L (ref 15–41)
Albumin: 1.6 g/dL — ABNORMAL LOW (ref 3.5–5.0)
Alkaline Phosphatase: 63 U/L (ref 38–126)
Anion gap: 7 (ref 5–15)
BUN: 5 mg/dL — ABNORMAL LOW (ref 8–23)
CO2: 25 mmol/L (ref 22–32)
Calcium: 7.9 mg/dL — ABNORMAL LOW (ref 8.9–10.3)
Chloride: 97 mmol/L — ABNORMAL LOW (ref 98–111)
Creatinine, Ser: 0.38 mg/dL — ABNORMAL LOW (ref 0.61–1.24)
GFR, Estimated: >60 mL/min (ref >60)
Glucose, Bld: 109 mg/dL — ABNORMAL HIGH (ref 70–99)
Potassium: 3.2 mmol/L — ABNORMAL LOW (ref 3.5–5.1)
Sodium: 129 mmol/L — ABNORMAL LOW (ref 135–145)
Total Bilirubin: 0.2 mg/dL — ABNORMAL LOW (ref 0.3–1.2)
Total Protein: 5.9 g/dL — ABNORMAL LOW (ref 6.5–8.1)

## 2020-06-22 LAB — BODY FLUID CELL COUNT WITH DIFFERENTIAL
Eos, Fluid: 0 %
Lymphs, Fluid: 0 %
Monocyte-Macrophage-Serous Fluid: 2 % — ABNORMAL LOW (ref 50–90)
Neutrophil Count, Fluid: 98 % — ABNORMAL HIGH (ref 0–25)
Total Nucleated Cell Count, Fluid: 11800 cu mm — ABNORMAL HIGH (ref 0–1000)

## 2020-06-22 LAB — PROTEIN, PLEURAL OR PERITONEAL FLUID: Total protein, fluid: 4.4 g/dL

## 2020-06-22 LAB — GLUCOSE, PLEURAL OR PERITONEAL FLUID: Glucose, Fluid: <20 mg/dL

## 2020-06-22 LAB — VANCOMYCIN, TROUGH: Vancomycin Tr: <4 ug/mL — ABNORMAL LOW (ref 15–20)

## 2020-06-22 LAB — LACTATE DEHYDROGENASE, PLEURAL OR PERITONEAL FLUID: LD, Fluid: 3588 U/L — ABNORMAL HIGH (ref 3–23)

## 2020-06-22 LAB — MAGNESIUM: Magnesium: 1.7 mg/dL (ref 1.7–2.4)

## 2020-06-22 MED ORDER — BENZONATATE 100 MG PO CAPS
100.0000 mg | ORAL_CAPSULE | Freq: Three times a day (TID) | ORAL | Status: DC
  Administered 2020-06-22 – 2020-06-28 (×18): 100 mg via ORAL
  Filled 2020-06-22 (×18): qty 1

## 2020-06-22 MED ORDER — VANCOMYCIN HCL IN DEXTROSE 1-5 GM/200ML-% IV SOLN
1000.0000 mg | Freq: Two times a day (BID) | INTRAVENOUS | Status: DC
  Administered 2020-06-22 – 2020-06-25 (×6): 1000 mg via INTRAVENOUS
  Filled 2020-06-22 (×6): qty 200

## 2020-06-22 MED ORDER — POTASSIUM CHLORIDE CRYS ER 20 MEQ PO TBCR
40.0000 meq | EXTENDED_RELEASE_TABLET | ORAL | Status: AC
  Administered 2020-06-22 (×2): 40 meq via ORAL
  Filled 2020-06-22 (×2): qty 2

## 2020-06-22 MED ORDER — LIDOCAINE HCL (PF) 1 % IJ SOLN
INTRAMUSCULAR | Status: AC
  Filled 2020-06-22: qty 30

## 2020-06-22 NOTE — Progress Notes (Signed)
[facility] Progress Note  Patient: Kirk Carrillo    JQG:[id number]  DOA: 06/20/2020     Date of Service: the patient was seen and examined on 06/22/2020  Brief hospital course: PMH of HT, active smoker, alcohol use without abuse presents with right leg pain and found to have multifocal necrotic lung cavities. PCCM consulted currently on Antibiotics.   Currently plan is current work-up and treatment.  Assessment and Plan: 1. Multiple right sided pulmonary cavitationsand Rightloculated pleural effusion  Sepsis POA due to pneumonia Weight loss Night sweats.  Procal mildly elevated. No fever or chills.  Met SIRS criteria on admission with tachycardia and leukocytosis. Covid negative.  X ray chest shows right sided pneumonia.  CT chest shows multiple areas of necrosis and possible abscess along with loculated pleural effusion on right.  Pt refuses chocking or ETOH abuse.  Pt refused any incarceration or travel to high endemic TB areas. Denies any immunosuppressive meds. Also denies significant alcohol abuse.  Pt reports significant weight loss and drenching night sweats needing to change clothes. PCCM consulted. Appreciate input.  Underwent drainage of loculated pleural effusion.   On vancomycin and meropenem.  Follow up on culture.  Since the differential includes TB will check AFB smear and quantiferon TB gold.  Airborne isolation.  Monitor cultures as well.  2. Hypokalemia , hypomagnesemia  K low, Mg low. will replace.   3. Hyponatremia, from poor PO intake Sodium initially improving with hydration. Monitor.  4. Iron deficiency anemia  Iron low.,will start oral iron. Not giving IV iron due to concerns with infection   5. b12 deficiency  replace  6. Moderate protein calorie malnutrition Add supplements   7.  PVD. Right leg ulcer on fourth and fifth toe. Left trophic ulcer due to pressure between the toes with worsening likely secondary to PVD. We will  check ABI. Currently continue dressing changes. If there is no improvement will consider vascular surgery involvement. No evidence of osteomyelitis on x-ray.  Currently no indication for MRI.  Body mass index is 19.26 kg/m.   Diet: regular diet DVT Prophylaxis: Subcutaneous Heparin   SCDs Start: 06/21/20 0137    Advance goals of care discussion: Full code  Family Communication: no family was present at bedside, at the time of interview.   Disposition:  Status is: Inpatient  Remains inpatient appropriate because:Ongoing diagnostic testing needed not appropriate for outpatient work up  Dispo: The patient is from: Home              Anticipated d/c is to: Home              Anticipated d/c date is: 3 days              Patient currently is not medically stable to d/c.  Subjective: Continues to have persistent cough.  No nausea no vomiting.  No fever no chills.  No chest pain.  No choking episode.  Physical Exam:  General: Appear in mild distress, no Rash; Oral Mucosa Clear, moist. no Abnormal Neck Mass Or lumps, Conjunctiva normal  Cardiovascular: S1 and S2 Present, no Murmur, Respiratory: good respiratory effort, Bilateral Air entry present and right Crackles, no wheezes Abdomen: Bowel Sound present, Soft and no tenderness Extremities: no Pedal edema Neurology: alert and oriented to time, place, and person affect appropriate. no new focal deficit Gait not checked due to patient safety concerns  Vitals:   06/22/20 0434 06/22/20 1045 06/22/20 1105 06/22/20 1145  BP: 131/76 (!) 102/59 107/60 129/72  Pulse: 96   88  Resp: 18   16  Temp: 100 F (37.8 C)   98.5 F (36.9 C)  TempSrc: Oral   Oral  SpO2: 95%   99%  Weight: 56.6 kg     Height:        Intake/Output Summary (Last 24 hours) at 06/22/2020 1600 Last data filed at 06/22/2020 1343 Gross per 24 hour  Intake 2543.58 ml  Output 1425 ml  Net 1118.58 ml   Filed Weights   06/20/20 2200 06/21/20 1700 06/22/20 0434    Weight: 56.1 kg 56.8 kg 56.6 kg    Data Reviewed: I have personally reviewed and interpreted daily labs, tele strips, imagings as discussed above. I reviewed all nursing notes, pharmacy notes, vitals, pertinent old records I have discussed plan of care as described above with RN and patient/family.  CBC: Recent Labs  Lab 06/20/20 2134 06/21/20 0500 06/22/20 0444  WBC 19.1* 13.2* 13.6*  NEUTROABS 15.1* 10.1* 10.5*  HGB 9.6* 8.7* 8.1*  HCT 30.7* 28.3* 26.0*  MCV 79.5* 78.6* 77.6*  PLT 673* 537* 352*   Basic Metabolic Panel: Recent Labs  Lab 06/20/20 2134 06/21/20 0500 06/22/20 0444  NA 129* 132* 129*  K 3.6 3.1* 3.2*  CL 89* 97* 97*  CO2 '25 25 25  ' GLUCOSE 88 91 109*  BUN 11 8 5*  CREATININE 0.69 0.50* 0.38*  CALCIUM 8.9 8.1* 7.9*  MG  --  1.6* 1.7  PHOS  --  3.9  --     Studies: DG Chest Port 1 View  Result Date: 06/22/2020 CLINICAL DATA:  Post thoracentesis. EXAM: PORTABLE CHEST 1 VIEW COMPARISON:  06/20/2020 FINDINGS: Lungs are adequately inflated with heterogeneous density over the right mid to lower lung with slight interval improved aeration over the medial right base likely due to improved known loculated pleural effusion post thoracentesis. No pneumothorax. Left lung is clear. Remainder the exam is unchanged. IMPRESSION: 1. Improved aeration over the medial right base likely improved loculated pleural effusion post thoracentesis. No pneumothorax. 2. Persistent heterogeneous density over the right mid to lower lung. Electronically Signed   By: Marin Olp M.D.   On: 06/22/2020 12:16   US THORACENTESIS ASP PLEURAL SPACE W/IMG GUIDE  Result Date: 06/22/2020 INDICATION: Patient with history of sepsis secondary to pneumonia, dyspnea, and loculated right pleural effusion. Request is made for diagnostic and therapeutic right thoracentesis. EXAM: ULTRASOUND GUIDED DIAGNOSTIC AND THERAPEUTIC RIGHT THORACENTESIS MEDICATIONS: 10 mL 1% lidocaine COMPLICATIONS: None  immediate. PROCEDURE: An ultrasound guided thoracentesis was thoroughly discussed with the patient and questions answered. The benefits, risks, alternatives and complications were also discussed. The patient understands and wishes to proceed with the procedure. Written consent was obtained. Ultrasound was performed to localize and mark an adequate pocket of loculated fluid in the right chest. The area was then prepped and draped in the normal sterile fashion. 1% Lidocaine was used for local anesthesia. Under ultrasound guidance a 6 Fr Safe-T-Centesis catheter was introduced. Thoracentesis was performed. The catheter was removed and a dressing applied. FINDINGS: A total of approximately 15 mL of clear yellow fluid was removed. Samples were sent to the laboratory as requested by the clinical team. Right pleural fluid is very loculated and only a small amount of fluid could be obtained. IMPRESSION: Successful ultrasound guided right thoracentesis yielding 15 mL of pleural fluid. Right pleural effusion is very loculated. Read by: Earley Abide, PA-C Electronically Signed   By: Markus Daft M.D.   On: 06/22/2020 11:47  Scheduled Meds:  feeding supplement  237 mL Oral BID BM   ferrous sulfate  325 mg Oral TID WC   guaiFENesin  600 mg Oral BID   lidocaine (PF)       nicotine  14 mg Transdermal Daily   vitamin B-12  1,000 mcg Oral Daily   Continuous Infusions:  meropenem (MERREM) IV 1 g (06/22/20 1151)   vancomycin 1,000 mg (06/22/20 1340)   PRN Meds: acetaminophen **OR** acetaminophen, albuterol, benzonatate, hydrALAZINE, ondansetron **OR** ondansetron (ZOFRAN) IV, polyethylene glycol  Time spent: 35 minutes  Author: Berle Mull, MD [facility] 06/22/2020 4:00 PM  To reach On-call, see care teams to locate the attending and reach out via [email or web address]. Between 7PM-7AM, please contact night-coverage If you still have difficulty reaching the attending provider, please page the [facility]  (Director on Call) for [facility] on amion for assistance.

## 2020-06-22 NOTE — Plan of Care (Signed)
  Problem: Activity: Goal: Risk for activity intolerance will decrease Outcome: Completed/Met   Problem: Nutrition: Goal: Adequate nutrition will be maintained Outcome: Completed/Met   Problem: Coping: Goal: Level of anxiety will decrease Outcome: Completed/Met   Problem: Elimination: Goal: Will not experience complications related to bowel motility Outcome: Completed/Met Goal: Will not experience complications related to urinary retention Outcome: Completed/Met   Problem: Pain Managment: Goal: General experience of comfort will improve Outcome: Completed/Met   Problem: Safety: Goal: Ability to remain free from injury will improve Outcome: Completed/Met   

## 2020-06-22 NOTE — Procedures (Signed)
PROCEDURE SUMMARY:  Successful image-guided right thoracentesis. Yielded 15 milliliters of clear yellow fluid. Patient tolerated procedure well. No immediate complications. EBL = 0 mL.  Specimen was sent for labs. CXR ordered.  Please see imaging section of Epic for full dictation.   Gordy Councilman Jalaiya Oyster PA-C 06/22/2020 11:04 AM

## 2020-06-22 NOTE — Progress Notes (Signed)
Pharmacy Antibiotic Note  Kirk Carrillo is a 59 y.o. male admitted on 06/20/2020 with pneumonia and Wound infection.  Pharmacy has been consulted for meropenem and vancomycin dosing.  11/21: WBC ~13, Tmax 100.4. Pt is also being worked up for empyema and tuberculosis. Thoracentesis complete - yielded 20mL of fluid which is being cultured. Per note, no signs of osteomyelitis w/ toe wounds.   Vancomycin trough result <4, will increase dose to meet trough goals.   Height: 5' 7.5" (171.5 cm) Weight: 56.6 kg (124 lb 12.8 oz) IBW/kg (Calculated) : 67.25  Temp (24hrs), Avg:99.7 F (37.6 C), Min:99 F (37.2 C), Max:100.4 F (38 C)  Recent Labs  Lab 06/20/20 2134 06/20/20 2147 06/20/20 2319 06/21/20 0500 06/22/20 0444  WBC 19.1*  --   --  13.2* 13.6*  CREATININE 0.69  --   --  0.50* 0.38*  LATICACIDVEN  --  1.0 0.8  --   --     Estimated Creatinine Clearance: 77.6 mL/min (A) (by C-G formula based on SCr of 0.38 mg/dL (L)).    Allergies  Allergen Reactions  . Amlodipine Swelling  . Telmisartan Other (See Comments)    Facial; angioedema.     Antimicrobials this admission: 11/19 Zosyn x1 11/19 Vancomycin >>  11/20 Meropenem >>   Dose adjustments this admission: N/a  Microbiology results: 11/21 acid fast smear / culture: pending 11/21 pleural culture: pending   Plan:  - Continue meropenem 1g every 8 hours - Increase vancomycin to 1,000mg  every 12 hours - Goal trough ~ 15 - Monitor renal function and urine output  - De-escalate ABX when appropriate   Yvetta Coder, PharmD PGY1 Acute Care Pharmacy Resident Please refer to [facility] for unit-specific pharmacist

## 2020-06-23 ENCOUNTER — Inpatient Hospital Stay (HOSPITAL_COMMUNITY): Payer: BLUE CROSS/BLUE SHIELD

## 2020-06-23 DIAGNOSIS — A419 Sepsis, unspecified organism: Secondary | ICD-10-CM | POA: Diagnosis not present

## 2020-06-23 DIAGNOSIS — L0390 Cellulitis, unspecified: Secondary | ICD-10-CM

## 2020-06-23 DIAGNOSIS — I739 Peripheral vascular disease, unspecified: Secondary | ICD-10-CM | POA: Diagnosis not present

## 2020-06-23 DIAGNOSIS — J189 Pneumonia, unspecified organism: Secondary | ICD-10-CM | POA: Diagnosis not present

## 2020-06-23 DIAGNOSIS — J90 Pleural effusion, not elsewhere classified: Secondary | ICD-10-CM

## 2020-06-23 LAB — CBC
HCT: 27 % — ABNORMAL LOW (ref 39.0–52.0)
Hemoglobin: 8.4 g/dL — ABNORMAL LOW (ref 13.0–17.0)
MCH: 24.3 pg — ABNORMAL LOW (ref 26.0–34.0)
MCHC: 31.1 g/dL (ref 30.0–36.0)
MCV: 78 fL — ABNORMAL LOW (ref 80.0–100.0)
Platelets: 494 10*3/uL — ABNORMAL HIGH (ref 150–400)
RBC: 3.46 MIL/uL — ABNORMAL LOW (ref 4.22–5.81)
RDW: 15.9 % — ABNORMAL HIGH (ref 11.5–15.5)
WBC: 12.5 10*3/uL — ABNORMAL HIGH (ref 4.0–10.5)
nRBC: 0 % (ref 0.0–0.2)

## 2020-06-23 LAB — ACID FAST SMEAR (AFB, MYCOBACTERIA): Acid Fast Smear: NEGATIVE

## 2020-06-23 LAB — BASIC METABOLIC PANEL
Anion gap: 7 (ref 5–15)
BUN: 6 mg/dL — ABNORMAL LOW (ref 8–23)
CO2: 25 mmol/L (ref 22–32)
Calcium: 8.1 mg/dL — ABNORMAL LOW (ref 8.9–10.3)
Chloride: 100 mmol/L (ref 98–111)
Creatinine, Ser: 0.37 mg/dL — ABNORMAL LOW (ref 0.61–1.24)
GFR, Estimated: >60 mL/min (ref >60)
Glucose, Bld: 96 mg/dL (ref 70–99)
Potassium: 4.1 mmol/L (ref 3.5–5.1)
Sodium: 132 mmol/L — ABNORMAL LOW (ref 135–145)

## 2020-06-23 LAB — QUANTIFERON-TB GOLD PLUS (RQFGPL)
QuantiFERON Mitogen Value: 2.18 IU/mL
QuantiFERON Nil Value: 0.12 IU/mL
QuantiFERON TB1 Ag Value: 0.12 IU/mL
QuantiFERON TB2 Ag Value: 0.13 IU/mL

## 2020-06-23 LAB — CULTURE, RESPIRATORY W GRAM STAIN: Culture: NORMAL

## 2020-06-23 LAB — C-REACTIVE PROTEIN: CRP: 15.2 mg/dL — ABNORMAL HIGH (ref <1.0)

## 2020-06-23 LAB — CYTOLOGY - NON PAP

## 2020-06-23 LAB — QUANTIFERON-TB GOLD PLUS: QuantiFERON-TB Gold Plus: NEGATIVE

## 2020-06-23 LAB — CULTURE, BLOOD (ROUTINE X 2): Special Requests: ADEQUATE

## 2020-06-23 MED ORDER — HYDROCODONE-ACETAMINOPHEN 5-325 MG PO TABS
1.0000 | ORAL_TABLET | Freq: Four times a day (QID) | ORAL | Status: DC | PRN
  Administered 2020-06-23 – 2020-06-25 (×6): 1 via ORAL
  Filled 2020-06-23 (×6): qty 1

## 2020-06-23 MED ORDER — ENSURE ENLIVE PO LIQD
237.0000 mL | Freq: Two times a day (BID) | ORAL | Status: DC
  Administered 2020-06-25 – 2020-06-28 (×2): 237 mL via ORAL

## 2020-06-23 MED ORDER — SODIUM CHLORIDE 0.9 % IV SOLN
INTRAVENOUS | Status: DC | PRN
  Administered 2020-06-23 – 2020-06-26 (×3): 250 mL via INTRAVENOUS

## 2020-06-23 MED ORDER — ADULT MULTIVITAMIN W/MINERALS CH
1.0000 | ORAL_TABLET | Freq: Every day | ORAL | Status: DC
  Administered 2020-06-24 – 2020-06-28 (×5): 1 via ORAL
  Filled 2020-06-23 (×5): qty 1

## 2020-06-23 NOTE — Evaluation (Signed)
Clinical/Bedside Swallow Evaluation Patient Details  Name: Kirk Carrillo MRN: [id number] Date of Birth: 1961-05-26  Today's Date: 06/23/2020 Time: SLP Start Time (ACUTE ONLY): 1155 SLP Stop Time (ACUTE ONLY): 1210 SLP Time Calculation (min) (ACUTE ONLY): 15 min  Past Medical History:  Past Medical History:  Diagnosis Date  . Hypertension   . Kidney stone    Past Surgical History: History reviewed. No pertinent surgical history. HPI:  Pt is a 59 year old male with past medical history of hypertension, nicotine dependence who presented to [facility] emergency department from a local [facility] health urgent care clinic for ulcerations of the fourth and fifth toes. Pt s/p thoracentecis. CXR 11/21: Improved aeration over the medial right base likely improved loculated pleural effusion post thoracentesis.   Assessment / Plan / Recommendation Clinical Impression  Pt was seen for bedside swallow evaluation and he denied a history of dysphagia. He stated that he had an urge to cough while eating yesterday which was unrelated to his swallowing. Oral mechanism exam was [facility] and dentition was adequate. He tolerated all solids and liquids without signs or symptoms of oropharyngeal dysphagia. Pt's swallow mechanism was challenged with continuous intake of 4oz of thin liquids via straw and with frequent speaking while consuming lunch and during mastication, but pt remained asymptomatic. A regular texture diet with thin liquids is recommended at this time. Pt's performance and recommendations were discussed with the pt and referring MD (i.e., Dr. Allena Katz). The possibility of conducting a modified barium swallow study to rule out silent aspiration was discussed, but it was agreed that it will be deferred at this time. SLP will sign off.  SLP Visit Diagnosis: Dysphagia, unspecified (R13.10)    Aspiration Risk  No limitations    Diet Recommendation Regular;Thin liquid   Liquid  Administration via: Cup;Straw Medication Administration: Whole meds with liquid Supervision: Patient able to self feed Postural Changes: Seated upright at 90 degrees    Other  Recommendations Oral Care Recommendations: Oral care BID   Follow up Recommendations None      Frequency and Duration            Prognosis        Swallow Study   General Date of Onset: 06/22/20 HPI: Pt is a 59 year old male with past medical history of hypertension, nicotine dependence who presented to [facility] emergency department from a local [facility] health urgent care clinic for ulcerations of the fourth and fifth toes. Pt s/p thoracentecis. CXR 11/21: Improved aeration over the medial right base likely improved loculated pleural effusion post thoracentesis. SLP was consulted d/t "excessive strong cough".  Type of Study: Bedside Swallow Evaluation Previous Swallow Assessment: None Diet Prior to this Study: Regular;Thin liquids Temperature Spikes Noted: No Respiratory Status: Room air History of Recent Intubation: No Behavior/Cognition: Alert;Cooperative;Pleasant mood Oral Cavity Assessment: Within Functional Limits Oral Care Completed by SLP: No Oral Cavity - Dentition: Adequate natural dentition Vision: Functional for self-feeding Self-Feeding Abilities: Able to feed self Patient Positioning: Upright in bed;Postural control adequate for testing Baseline Vocal Quality: Normal Volitional Cough: Strong Volitional Swallow: Able to elicit    Oral/Motor/Sensory Function Overall Oral Motor/Sensory Function: Within functional limits   Ice Chips Ice chips: Within functional limits Presentation: Spoon   Thin Liquid Thin Liquid: Within functional limits Presentation: Straw    Nectar Thick Nectar Thick Liquid: Not tested   Honey Thick Honey Thick Liquid: Not tested   Puree Puree: Within functional limits Presentation: Spoon   Solid  Solid: Within functional  limits Presentation: Self Fed     Gahel Safley I. Vear Clock, MS, CCC-SLP Acute Rehabilitation Services Office number [phone] Pager [phone]  Scheryl Marten 06/23/2020,12:49 PM

## 2020-06-23 NOTE — Progress Notes (Signed)
Initial Nutrition Assessment  DOCUMENTATION CODES:   Not applicable  INTERVENTION:   -Ensure Enlive po BID, each supplement provides 350 kcal and 20 grams of protein -MVI with minerals daily  NUTRITION DIAGNOSIS:   Increased nutrient needs related to acute illness (rt pleural effusion) as evidenced by estimated needs.  GOAL:   Patient will meet greater than or equal to 90% of their needs  MONITOR:   PO intake, Supplement acceptance, Labs, Weight trends, Skin, I & O's  REASON FOR ASSESSMENT:   Malnutrition Screening Tool    ASSESSMENT:   59 year old male with past medical history of hypertension, nicotine dependence who presents to [facility] emergency department after being sent from a local [facility] health urgent care clinic for ulcerations of the fourth and fifth toes.  Pt admitted with sepsis secondary to pneumonia and multiple rt sided pulmonary cavitations and rt pleural effusion.  11/21- s/p rt thoracentesis (15 ml clear yellow fluid removed) 11/22- s/p BSE- recommend regular diet with thin liquids  Reviewed I/O's: +185 ml x 24 hours and +1.5 L since admission  UOP: 1.6 L x 24 hours  Pt unavailable at times of attempted visits.   Per IR notes, plan for rt chest tube placement on 06/24/20. Per CVTS notes, awaiting cultures for work up of cavitary lesions (infections vs malignancy).   Noted pt with good appetite. Noted meal completion 100%.   No wt hx available to assess.  Medications reviewed and include vitamin B-12.   Labs reviewed: Na: 132.   Diet Order:   Diet Order            Diet NPO time specified Except for: Sips with Meds  Diet effective midnight           Diet 2 gram sodium Room service appropriate? Yes; Fluid consistency: Thin  Diet effective now                 EDUCATION NEEDS:   No education needs have been identified at this time  Skin:  Skin Assessment: Skin Integrity Issues: Skin Integrity Issues:: Diabetic  Ulcer Diabetic Ulcer: Right foot, medial 5th digit and lateral 4th digit full thickness wounds  Last BM:  06/22/20  Height:   Ht Readings from Last 1 Encounters:  06/20/20 5' 7.5" (1.715 m)    Weight:   Wt Readings from Last 1 Encounters:  06/23/20 55.8 kg   BMI:  Body mass index is 19 kg/m.  Estimated Nutritional Needs:   Kcal:  1950-2150  Protein:  95-110 grams  Fluid:  > 2 L    Levada Schilling, RD, LDN, CDCES Registered Dietitian II Certified Diabetes Care and Education Specialist Please refer to [facility] for RD and/or RD on-call/weekend/after hours pager

## 2020-06-23 NOTE — Consult Note (Addendum)
[address]       [place] [address]             [phone]        [place] Coate [facility] Medical Record #[id number] Date of Birth: 18-Sep-1960  Referring: Hospitalist Primary Care: Pcp, No Primary Cardiologist:No primary care provider on file.  Chief Complaint:    Chief Complaint  Patient presents with   Foot Pain   History of Present Illness:      Mr. Hove is a 59 yo male with history of HTN and tobacco abuse.  He presented with complaints of cough which was previously productive, weight loss, shortness of breath on exertion, and night sweats.  CXR was obtained and was abnormal.  Further workup with CT scan showed multiple right sided pulmonary cavitations with a loculated pleural effusion.  The patient is a smoker of 1.5 ppd for the past 42 years.  He states that has been experiencing these symptoms over the past 6 months, but he isn't sure how much weight he has lost overall.  He denies any fevers.  He denies any exposures, international travel, or known people with TB.  He admits to occasional alcohol abuse.  He was negative for flu in the ED.  He is fully vaccinated for COVID 19 which was also negative.  He had a mild leukocytosis at 13.2.  He was admitted for further care.  He underwent Thoracentesis with removal of 15 ml of yellow fluid.  Culture were obtained.  He was started on ABX for suspected pneumonia, empyema. Cardiothoracic surgery has been consulted for possible intervention.  Overall patient is doing a little better.  He denies any episodes of fevers.  Current Activity/ Functional Status: Patient is independent with mobility/ambulation, transfers, ADL's, IADL's.     Past Medical History:  Diagnosis Date   Hypertension    Kidney stone     History reviewed. No pertinent surgical history.  Social History   Tobacco Use  Smoking Status Current Every Day Smoker   Packs/day: 0.50  Smokeless Tobacco Never Used    Social History    Substance and Sexual Activity  Alcohol Use Yes   Alcohol/week: 35.0 standard drinks   Types: 35 Cans of beer per week     Allergies  Allergen Reactions   Amlodipine Swelling   Telmisartan Other (See Comments)    Facial; angioedema.     Current Facility-Administered Medications  Medication Dose Route Frequency Provider Last Rate Last Admin   acetaminophen (TYLENOL) tablet 650 mg  650 mg Oral Q6H PRN Marinda Elk, MD   650 mg at 06/22/20 0451   Or   acetaminophen (TYLENOL) suppository 650 mg  650 mg Rectal Q6H PRN Shalhoub, Deno Lunger, MD       albuterol (VENTOLIN HFA) 108 (90 Base) MCG/ACT inhaler 2 puff  2 puff Inhalation Q4H PRN Shalhoub, Deno Lunger, MD       benzonatate (TESSALON) capsule 100 mg  100 mg Oral TID Rolly Salter, MD   100 mg at 06/23/20 1058   ferrous sulfate tablet 325 mg  325 mg Oral TID WC Rolly Salter, MD   325 mg at 06/23/20 1058   guaiFENesin (MUCINEX) 12 hr tablet 600 mg  600 mg Oral BID Selmer Dominion B, NP   600 mg at 06/23/20 1058   hydrALAZINE (APRESOLINE) injection 10 mg  10 mg Intravenous Q6H PRN Shalhoub, Deno Lunger, MD  meropenem (MERREM) 1 g in sodium chloride 0.9 % 100 mL IVPB  1 g Intravenous Q8H Shalhoub, Deno Lunger, MD 200 mL/hr at 06/23/20 1104 1 g at 06/23/20 1104   nicotine (NICODERM CQ - dosed in mg/24 hours) patch 14 mg  14 mg Transdermal Daily Shalhoub, Deno Lunger, MD   14 mg at 06/23/20 1058   ondansetron (ZOFRAN) tablet 4 mg  4 mg Oral Q6H PRN Marinda Elk, MD       Or   ondansetron [facility]) injection 4 mg  4 mg Intravenous Q6H PRN Shalhoub, Deno Lunger, MD       polyethylene glycol (MIRALAX / GLYCOLAX) packet 17 g  17 g Oral Daily PRN Shalhoub, Deno Lunger, MD       vancomycin (VANCOCIN) IVPB 1000 mg/200 mL premix  1,000 mg Intravenous Q12H Leonia Reader, RPH 200 mL/hr at 06/23/20 0421 1,000 mg at 06/23/20 0421   vitamin B-12 (CYANOCOBALAMIN) tablet 1,000 mcg  1,000 mcg Oral Daily Rolly Salter, MD   1,000 mcg at 06/23/20  1058    Medications Prior to Admission  Medication Sig Dispense Refill Last Dose   diphenhydrAMINE (BENADRYL) 25 mg capsule Take 25 mg by mouth in the morning, at noon, and at bedtime.   06/20/2020 at Unknown time   guaifenesin (ROBITUSSIN) 100 MG/5ML syrup Take 200 mg by mouth 2 times daily at 12 noon and 4 pm.   06/20/2020 at Unknown time   hydrochlorothiazide (HYDRODIURIL) 25 MG tablet Take 1 tablet (25 mg total) by mouth daily. 30 tablet 2 06/20/2020 at Unknown time   ibuprofen (ADVIL) 200 MG tablet Take 400 mg by mouth every 6 (six) hours as needed for moderate pain.   Past Week at Unknown time   albuterol (PROAIR HFA) 108 (90 Base) MCG/ACT inhaler Inhale 1-2 puffs into the lungs every 6 (six) hours as needed for wheezing or shortness of breath. (Patient not taking: Reported on 06/21/2020) 8 g 0 Not Taking at Unknown time   benzonatate (TESSALON) 100 MG capsule Take 1-2 capsules (100-200 mg total) by mouth 3 (three) times daily as needed for cough. (Patient not taking: Reported on 06/21/2020) 21 capsule 0 Not Taking at Unknown time    Family History  Problem Relation Age of Onset   Breast cancer Mother     Review of Systems:   ROS    Cardiac Review of Systems: Y or  [    ]= no  Chest Pain [    ]  Resting SOB [   ] Exertional SOB  [ Y ]  Orthopnea [  ]   Pedal Edema [   ]    Palpitations Klaus.Mock  ] Syncope  [  ]   Presyncope [   ]  General Review of Systems: [Y] = yes [  ]=no Constitional: recent weight change [ Y ]; anorexia [  ]; fatigue [ Y ]; nausea [  ]; night sweats [Y  ]; fever Klaus.Mock  ]; or chills [ Y ]                                                               Dental: Last Dentist visit:   Eye : blurred vision [  ]; diplopia [   ]; vision changes [  ];  Amaurosis fugax[  ]; Resp: cough [Y  ];  wheezing[  ];  hemoptysis[  ]; shortness of breath[  ]; paroxysmal nocturnal dyspnea[  ]; dyspnea on exertion[  ]; or orthopnea[  ];  GI:  gallstones[  ], vomiting[ N ];  dysphagia[  ];  melena[  ];  hematochezia [  ]; heartburn[  ];   Hx of  Colonoscopy[  ]; GU: kidney stones [  ]; hematuria[  ];   dysuria [  ];  nocturia[  ];  history of     obstruction [  ]; urinary frequency [  ]             Skin: rash, swelling[N  ];, hair loss[  ];  peripheral edema[N  ];  or itching[  ]; Musculosketetal: myalgias[  ];  joint swelling[  ];  joint erythema[  ];  joint pain[  ];  back pain[  ];  Heme/Lymph: bruising[  ];  bleeding[  ];  anemia[  ];  Neuro: TIA[  ];  headaches[  ];  stroke[N  ];  vertigo[  ];  seizures[  ];   paresthesias[  ];  difficulty walking[  ];  Psych:depression[  ]; anxiety[  ];  Endocrine: diabetes[ N ];  thyroid dysfunction[ N ];  Physical Exam: BP 115/74   Pulse 83   Temp 98.3 F (36.8 C) (Oral)   Resp 18   Ht 5' 7.5" (1.715 m)   Wt 55.8 kg   SpO2 99%   BMI 19.00 kg/m   General appearance: alert, cooperative, no distress and appears malnourished Head: Normocephalic, without obvious abnormality, atraumatic Resp: diminished breath sounds right base Cardio: regular rate and rhythm GI: soft, non-tender; bowel sounds normal; no masses,  no organomegaly Extremities: extremities normal, atraumatic, no cyanosis or edema Neurologic: Grossly normal  Diagnostic Studies & Laboratory data:     Recent Radiology Findings:   DG Chest Port 1 View  Result Date: 06/22/2020 CLINICAL DATA:  Post thoracentesis. EXAM: PORTABLE CHEST 1 VIEW COMPARISON:  06/20/2020 FINDINGS: Lungs are adequately inflated with heterogeneous density over the right mid to lower lung with slight interval improved aeration over the medial right base likely due to improved known loculated pleural effusion post thoracentesis. No pneumothorax. Left lung is clear. Remainder the exam is unchanged. IMPRESSION: 1. Improved aeration over the medial right base likely improved loculated pleural effusion post thoracentesis. No pneumothorax. 2. Persistent heterogeneous density over the right mid to lower  lung. Electronically Signed   By: Elberta Fortis M.D.   On: 06/22/2020 12:16   VAS [place] ABI WITH/WO TBI  Result Date: 06/23/2020 LOWER EXTREMITY DOPPLER STUDY Indications: Claudication, ulceration, and peripheral artery disease. High Risk Factors: Current smoker. Other Factors: Has known PAD, prior workup at the [place].  Comparison Study: No prior study on file Performing Technologist: Sherren Kerns RVS  Examination Guidelines: A complete evaluation includes at minimum, Doppler waveform signals and systolic blood pressure reading at the level of bilateral brachial, anterior tibial, and posterior tibial arteries, when vessel segments are accessible. Bilateral testing is considered an integral part of a complete examination. Photoelectric Plethysmograph (PPG) waveforms and toe systolic pressure readings are included as required and additional duplex testing as needed. Limited examinations for reoccurring indications may be performed as noted.  ABI Findings: +---------+------------------+-----+----------+--------+ Right    Rt Pressure (mmHg)IndexWaveform  Comment  +---------+------------------+-----+----------+--------+ Brachial 129                    triphasic          +---------+------------------+-----+----------+--------+  PTA      53                0.41 monophasic         +---------+------------------+-----+----------+--------+ DP       69                0.53 monophasic         +---------+------------------+-----+----------+--------+ Great Toe14                0.11                    +---------+------------------+-----+----------+--------+ +---------+------------------+-----+----------+-------+ Left     Lt Pressure (mmHg)IndexWaveform  Comment +---------+------------------+-----+----------+-------+ Brachial 120                    triphasic         +---------+------------------+-----+----------+-------+ PTA      76                0.59 monophasic         +---------+------------------+-----+----------+-------+ PERO     77                0.60 monophasic        +---------+------------------+-----+----------+-------+ Great Toe87                0.67                   +---------+------------------+-----+----------+-------+ +-------+-----------+-----------+------------+------------+ ABI/TBIToday's ABIToday's TBIPrevious ABIPrevious TBI +-------+-----------+-----------+------------+------------+ Right  0.53       0.11                                +-------+-----------+-----------+------------+------------+ Left   0.60       0.67                                +-------+-----------+-----------+------------+------------+  Summary: Right: Resting right ankle-brachial index indicates moderate right lower extremity arterial disease. The right toe-brachial index is abnormal. Left: Resting left ankle-brachial index indicates moderate left lower extremity arterial disease. The left toe-brachial index is abnormal.  *See table(s) above for measurements and observations.     Preliminary    US THORACENTESIS ASP PLEURAL SPACE W/IMG GUIDE  Result Date: 06/22/2020 INDICATION: Patient with history of sepsis secondary to pneumonia, dyspnea, and loculated right pleural effusion. Request is made for diagnostic and therapeutic right thoracentesis. EXAM: ULTRASOUND GUIDED DIAGNOSTIC AND THERAPEUTIC RIGHT THORACENTESIS MEDICATIONS: 10 mL 1% lidocaine COMPLICATIONS: None immediate. PROCEDURE: An ultrasound guided thoracentesis was thoroughly discussed with the patient and questions answered. The benefits, risks, alternatives and complications were also discussed. The patient understands and wishes to proceed with the procedure. Written consent was obtained. Ultrasound was performed to localize and mark an adequate pocket of loculated fluid in the right chest. The area was then prepped and draped in the normal sterile fashion. 1% Lidocaine was used for local  anesthesia. Under ultrasound guidance a 6 Fr Safe-T-Centesis catheter was introduced. Thoracentesis was performed. The catheter was removed and a dressing applied. FINDINGS: A total of approximately 15 mL of clear yellow fluid was removed. Samples were sent to the laboratory as requested by the clinical team. Right pleural fluid is very loculated and only a small amount of fluid could be obtained. IMPRESSION: Successful ultrasound guided right thoracentesis yielding 15 mL of pleural fluid. Right pleural effusion is very loculated.  Read by: Elwin MochaAlexandra Louk, PA-C Electronically Signed   By: Richarda OverlieAdam  Henn M.D.   On: 06/22/2020 11:47     I have independently reviewed the above radiologic studies and discussed with the patient   Recent Lab Findings: Lab Results  Component Value Date   WBC 12.5 (H) 06/23/2020   HGB 8.4 (L) 06/23/2020   HCT 27.0 (L) 06/23/2020   PLT 494 (H) 06/23/2020   GLUCOSE 96 06/23/2020   ALT 23 06/22/2020   AST 23 06/22/2020   NA 132 (L) 06/23/2020   K 4.1 06/23/2020   CL 100 06/23/2020   CREATININE 0.37 (L) 06/23/2020   BUN 6 (L) 06/23/2020   CO2 25 06/23/2020   INR 1.1 06/21/2020   Assessment / Plan:      1. Cavitary Lesions- Infectious etiology vs ? Malignancy--- Thoracentesis performed and cultures/cytology remain pending 2. ID- afebrile, no leukocytosis, on ABX 3. HTN 4. Nicotine abuse 5. Dispo- patient stable, right posterior pleural effusion, recommended IR placement of drain into posterior collection, no surgery at this time.  Dr. Tyrone SageGerhardt to follow up with further recommendations  I  spent 55 minutes counseling the patient face to face.   Lowella Dandyrin Barrett, PA-C 06/23/2020 2:00 PM Patient seen and examined History reviewed and xrays reviewed Would recommend ct directed chest tube to right posterior fluid collection  I have seen and examined Samuella CotaJohnny Chiquito and agree with the above assessment  and plan.  Delight OvensEdward B Jailen Coward MD Beeper [phone] Office  [phone] 06/23/2020 2:35 PM

## 2020-06-23 NOTE — Progress Notes (Signed)
VASCULAR LAB    ABIs have been performed.  See CV proc for preliminary results.   Marcea Rojek, RVT 06/23/2020, 12:27 PM

## 2020-06-23 NOTE — Progress Notes (Signed)
[facility] Progress Note  Patient: Kirk Carrillo    STM:[id number]  DOA: 06/20/2020     Date of Service: the patient was seen and examined on 06/23/2020  Brief hospital course: PMH of HT, active smoker, alcohol use without abuse presents with right leg pain and found to have multifocal necrotic lung cavities. PCCM consulted currently on Antibiotics.   Currently plan is current work-up and treatment.  Assessment and Plan: 1. Multiple right sided pulmonary cavitationsand Rightloculated pleural effusion  Sepsis POA due to pneumonia Weight loss Night sweats.  Procal mildly elevated. No fever or chills.  Met SIRS criteria on admission with tachycardia and leukocytosis. Covid negative.  X ray chest shows right sided pneumonia.  CT chest shows multiple areas of necrosis and possible abscess along with loculated pleural effusion on right.  Pt refuses chocking or ETOH abuse.  Pt refused any incarceration or travel to high endemic TB areas. Denies any immunosuppressive meds. Also denies significant alcohol abuse.  Pt reports significant weight loss and drenching night sweats needing to change clothes. PCCM consulted. Appreciate input.  Underwent drainage of loculated pleural effusion.   On vancomycin and meropenem.  Follow up on culture.  Since the differential includes TB will check AFB smear and quantiferon TB gold.  Airborne isolation.  Monitor cultures as well. CT surgery consulted recommend CT-guided chest tube placement which will be placed tomorrow.  N.p.o. after midnight.  2. Hypokalemia , hypomagnesemia  Continue to monitor.  Now normal.  3. Hyponatremia, from poor PO intake Sodium initially improving with hydration. Monitor.  4. Iron deficiency anemia  Iron low.,will start oral iron. Not giving IV iron due to concerns with infection   5. b12 deficiency  replace  6. Moderate protein calorie malnutrition Add supplements   7.  PVD. Right leg ulcer on  fourth and fifth toe. Left trophic ulcer due to pressure between the toes with worsening likely secondary to PVD. Right ABI 0.5.  Left ABI 0.6.  Will discuss with vascular surgery with regards to ulcer and PVD. Currently continue dressing changes. No evidence of osteomyelitis on x-ray.  Currently no indication for MRI.  Body mass index is 19 kg/m.   Diet: regular diet DVT Prophylaxis: Subcutaneous Heparin   SCDs Start: 06/21/20 0137    Advance goals of care discussion: Full code  Family Communication: no family was present at bedside, at the time of interview.   Disposition:  Status is: Inpatient  Remains inpatient appropriate because:Ongoing diagnostic testing needed not appropriate for outpatient work up  Dispo: The patient is from: Home              Anticipated d/c is to: Home              Anticipated d/c date is: 3 days              Patient currently is not medically stable to d/c.  Subjective: No nausea no vomiting.  No fever no chills.  Reported diarrhea with Ensure.  Physical Exam:  General: Appear in mild distress, no Rash; Oral Mucosa Clear, moist. no Abnormal Neck Mass Or lumps, Conjunctiva normal  Cardiovascular: S1 and S2 Present, no Murmur, Respiratory: good respiratory effort, Bilateral Air entry present and right Crackles, no wheezes Abdomen: Bowel Sound present, Soft and no tenderness Extremities: no Pedal edema Neurology: alert and oriented to time, place, and person affect appropriate. no new focal deficit Gait not checked due to patient safety concerns  Vitals:   06/22/20 1840 06/22/20  2103 06/23/20 0422 06/23/20 1303  BP: (!) 143/80 137/81 119/79 115/74  Pulse: (!) 104 92 86 83  Resp: '18 18 18   ' Temp:  98.9 F (37.2 C) 98.4 F (36.9 C) 98.3 F (36.8 C)  TempSrc:  Oral Oral Oral  SpO2: 98% 99% 96% 99%  Weight:   55.8 kg   Height:        Intake/Output Summary (Last 24 hours) at 06/23/2020 1958 Last data filed at 06/23/2020 1428 Gross per 24  hour  Intake 1140 ml  Output 1425 ml  Net -285 ml   Filed Weights   06/21/20 1700 06/22/20 0434 06/23/20 0422  Weight: 56.8 kg 56.6 kg 55.8 kg    Data Reviewed: I have personally reviewed and interpreted daily labs, tele strips, imagings as discussed above. I reviewed all nursing notes, pharmacy notes, vitals, pertinent old records I have discussed plan of care as described above with RN and patient/family.  CBC: Recent Labs  Lab 06/20/20 2134 06/21/20 0500 06/22/20 0444 06/23/20 0649  WBC 19.1* 13.2* 13.6* 12.5*  NEUTROABS 15.1* 10.1* 10.5*  --   HGB 9.6* 8.7* 8.1* 8.4*  HCT 30.7* 28.3* 26.0* 27.0*  MCV 79.5* 78.6* 77.6* 78.0*  PLT 673* 537* 433* 308*   Basic Metabolic Panel: Recent Labs  Lab 06/20/20 2134 06/21/20 0500 06/22/20 0444 06/23/20 0649  NA 129* 132* 129* 132*  K 3.6 3.1* 3.2* 4.1  CL 89* 97* 97* 100  CO2 '25 25 25 25  ' GLUCOSE 88 91 109* 96  BUN 11 8 5* 6*  CREATININE 0.69 0.50* 0.38* 0.37*  CALCIUM 8.9 8.1* 7.9* 8.1*  MG  --  1.6* 1.7  --   PHOS  --  3.9  --   --     Studies: VAS [place] ABI WITH/WO TBI  Result Date: 06/23/2020 LOWER EXTREMITY DOPPLER STUDY Indications: Claudication, ulceration, and peripheral artery disease. High Risk Factors: Current smoker. Other Factors: Has known PAD, prior workup at the [place].  Comparison Study: No prior study on file Performing Technologist: Sharion Dove RVS  Examination Guidelines: A complete evaluation includes at minimum, Doppler waveform signals and systolic blood pressure reading at the level of bilateral brachial, anterior tibial, and posterior tibial arteries, when vessel segments are accessible. Bilateral testing is considered an integral part of a complete examination. Photoelectric Plethysmograph (PPG) waveforms and toe systolic pressure readings are included as required and additional duplex testing as needed. Limited examinations for reoccurring indications may be performed as noted.  ABI Findings:  +---------+------------------+-----+----------+--------+  Right     Rt Pressure (mmHg) Index Waveform   Comment   +---------+------------------+-----+----------+--------+  Brachial  129                      triphasic            +---------+------------------+-----+----------+--------+  PTA       53                 0.41  monophasic           +---------+------------------+-----+----------+--------+  DP        69                 0.53  monophasic           +---------+------------------+-----+----------+--------+  Great Toe 14                 0.11                       +---------+------------------+-----+----------+--------+ +---------+------------------+-----+----------+-------+  Left      Lt Pressure (mmHg) Index Waveform   Comment  +---------+------------------+-----+----------+-------+  Brachial  120                      triphasic           +---------+------------------+-----+----------+-------+  PTA       76                 0.59  monophasic          +---------+------------------+-----+----------+-------+  PERO      77                 0.60  monophasic          +---------+------------------+-----+----------+-------+  Great Toe 87                 0.67                      +---------+------------------+-----+----------+-------+ +-------+-----------+-----------+------------+------------+  ABI/TBI Today's ABI Today's TBI Previous ABI Previous TBI  +-------+-----------+-----------+------------+------------+  Right   0.53        0.11                                   +-------+-----------+-----------+------------+------------+  Left    0.60        0.67                                   +-------+-----------+-----------+------------+------------+  Summary: Right: Resting right ankle-brachial index indicates moderate right lower extremity arterial disease. The right toe-brachial index is abnormal. Left: Resting left ankle-brachial index indicates moderate left lower extremity arterial disease. The left toe-brachial index is  abnormal.  *See table(s) above for measurements and observations.  Electronically signed by Monica Martinez MD on 06/23/2020 at 4:41:57 PM.    Final     Scheduled Meds:  benzonatate  100 mg Oral TID   [START ON 06/24/2020] feeding supplement  237 mL Oral BID BM   ferrous sulfate  325 mg Oral TID WC   guaiFENesin  600 mg Oral BID   multivitamin with minerals  1 tablet Oral Daily   nicotine  14 mg Transdermal Daily   vitamin B-12  1,000 mcg Oral Daily   Continuous Infusions:  meropenem (MERREM) IV 1 g (06/23/20 1426)   vancomycin 1,000 mg (06/23/20 1528)   PRN Meds: acetaminophen **OR** acetaminophen, albuterol, hydrALAZINE, HYDROcodone-acetaminophen, ondansetron **OR** ondansetron (ZOFRAN) IV, polyethylene glycol  Time spent: 35 minutes  Author: Berle Mull, MD [facility] 06/23/2020 7:58 PM  To reach On-call, see care teams to locate the attending and reach out via [email or web address]. Between 7PM-7AM, please contact night-coverage If you still have difficulty reaching the attending provider, please page the [facility] (Director on Call) for [facility] on amion for assistance.

## 2020-06-23 NOTE — Progress Notes (Signed)
Request to IR for right large bore chest tube placement - patient history and imaging reviewed today by Dr. Arie Sabina who approves procedure.  Plan for CT guided chest tube placement 11/23 - patient to be NPO at midnight, hold anticoagulation until post procedure, AM labs pending, IR will see for full consult/consent in AM.  Lynnette Caffey, PA-C

## 2020-06-23 NOTE — Consult Note (Signed)
NAME:  Kirk Carrillo, MRN:  [id number], DOB:  19-Dec-1960, LOS: 2 ADMISSION DATE:  06/20/2020, CONSULTATION DATE:  06/21/2020 REFERRING MD:  Dr. Allena Katz, CHIEF COMPLAINT:  Loculated pleural effusion  Brief History   77 yoM with hx of tobacco abuse and HTN presenting with foot wound additionally reporting cough, previously productive, with recent weight loss, exertional dyspnea, and night sweats.  CXR found abnormal, CT chest showing multiple right sided pulmonary cavitations with loculated pleural effusion.    History of present illness   59 year old male with prior history of HTN and tobacco abuse who was sent by [facility] urgent care for evaluation of  right food wounds, between his right fourth and fifth toes, which developed over the last several weeks after recently wearing tight fitting shoes.  Found to have a leukocytosis and was unable to be seen by podiatry, therefore sent to ER for further evaluation.  While in ER, patient also complained of worsening cough over the last 6 months with productive sputum with associated 20 lb unintentional weight loss.    Patient has never seen a pulmonologist.  Smoked cigarettes for the last 42 years, up to 1.5 ppd at his heaviest, recently down to 0.5 ppd; no vaping history.  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  Reports history of exposures to chemicals at work and while he worked in [employer].  No pets or work around pets, hot tubs, or mold exposure. He is fully vaccinated against COVID 19.   He reports always having a "smoker's cough" but over the last several months had become productive with foul smelling sputum and occasional blood tinged but now has changed to a dry cough.  Additionally, he feels like he has developed exertional shortness of breath.  Denies fever, syncope, or leg edema  Has been having night sweats in which he has to change his clothes. Denies recent or international travel, IVDA, incarceration, close contacts  with diagnosed tuberculosis, or sick exposures.  Sometimes has post-tussive emesis but denies any dysphagia or choking episodes.  No recent bloody sputum or change in stools.  He does take three aspirin a day because he heard it was good for you.  Occasional ETOH use.   He was afebrile, but tachycardic and tachypneic.  Remains on room air with saturations > 94%.  Workup thus far with right foot xray without evidence of osteomyelitis.  Labs noted for Na 132, K 3.1, CL 97, normal lactic, WBC 13.2, Hgb 8.7, Platelets 537, Mag 1.6, low albumin and protein, PCT 0.15, UA with 20 ketones and high SG.  SARS 2/flu negative. CXR with patchy airspace disease throughout the right lung with small pleural effusion.  A chest CT was obtained which revealed multiple cavitary lesions throughout the right lung, predominately in the right upper lobe with associated areas of traction bronchiectasis; loculated areas of right pleural effusion concerning for empyema, overall concerning for a necrotic pneumonia with empyema versus underlying malignancy.  Cultures sent and empirically placed on meropenem and vancomycin and airborne precautions.  AFB and quantiferon pending.  [facility] to [facility].  Pulmonary consulted for further recommendations.    Past Medical History  HTN, tobacco abuse, "PVD" per patient  Significant Hospital Events   11/20 Admitted [facility] 11/21  Consults:  Pulmonary 11/20  Procedures:   Significant Diagnostic Tests:  11/21 LDH 4k, gram stain negative, culture pending 11/19 R foot XR >> neg for acute process 11/19 CXR >>Volume loss with patchy  airspace disease throughout the right lung. Cannot exclude pneumonia. Small right pleural effusion.  11/19 CT chest w/ contrast >> 1. Multiple areas of cavitation with extensive reticular change and architectural distortion throughout the right lung, predominantly in the right upper lobe with associated areas of traction bronchiectasis. Several areas of  cavitation appear more thick-walled and peripherally enhancing containing a admixed fluid and foci of gas concerning for superinfection and or abscess formation.  Loculated areas of right pleural effusion visceral and parietal pleural thickening worrisome for empyema as well. While this appearance could reflect sequela of a necrotic pneumonia with empyema, underlying malignancy is not fully excluded. 2. Mediastinal and right hilar adenopathy, possibly reactive or metastatic. 3. Circumferential thickening and mucosal hyperemia of the distal esophagus, correlate for features of esophagitis. Consider further evaluation with direct visualization. 4. Small sliding-type hiatal hernia with distal esophageal thickening and mucosal hyperemia, correlate for features of reflux. 5. Three-vessel coronary artery atherosclerosis. 6. Aortic Atherosclerosis   Micro Data:  11/19 SARS 2/ flu >> neg 11/19 BCx2 >> 11/20 expectorated sputum >>  AFB >>  MTB RIF >>  Antimicrobials:  11/19 meropenem  11/19 zosyn 11/19 vancomycin >>  Interim history/subjective:  No current complaints. Reviewed pleural fluid studies and rationale for thoracic involvement with patient, expressed understanding.   Objective   Blood pressure 119/79, pulse 86, temperature 98.4 F (36.9 C), temperature source Oral, resp. rate 18, height 5' 7.5" (1.715 m), weight 55.8 kg, SpO2 96 %.        Intake/Output Summary (Last 24 hours) at 06/23/2020 1110 Last data filed at 06/23/2020 6384 Gross per 24 hour  Intake 1560 ml  Output 1575 ml  Net -15 ml   Filed Weights   06/21/20 1700 06/22/20 0434 06/23/20 0422  Weight: 56.8 kg 56.6 kg 55.8 kg   Examination: seen in airborne precautions General:  Thin adult male sitting in bed in NAD Neuro: AOx 4, MAE CV: rr, no murmur PULM:  Non labored, no cough, right with scattered rhonchi/ rales, left clear GI: soft, bs active  Extremities: warm/dry, no LE edema, right 4/5 digital  ulceration Skin: no rashes       Right posterior lung bedside ultrasound   Resolved Hospital Problem list    Assessment & Plan:   Multiple right sided pulmonary cavitations and Right loculated pleural effusion concerning for most likely infectious etiology vs malignancy vs TB. No risk factors for TB. S/p Thora with only 15 cc removed that is highly inflammatory, exudative, LDH >4k, PMN predominant, gram stain negative - fits best with empyema/parapneumonic effusion. WBC, fever better on abx.  P:  HIV neg Pending MTB PCR, quantiferon, and AFB smears Continue airborne precautions Follow culture data Continue meropenum and vancomycin for now Loculated, complex, posterior effusion, highly inflammatory on pleural fluid studies with LDH >4k, Consult placed to Thoracic surgery for evaluation (surgical chest tube vs decortication?)  Pulmonary will continue to follow. Remainder per primary team  Best practice:  Diet: per primary  Pain/Anxiety/Delirium protocol (if indicated): n/a VAP protocol (if indicated): n/a DVT prophylaxis: SCDs GI prophylaxis: n/a Glucose control: trend on BMET Mobility: as tolerated  Code Status: full  Family Communication: patient  updated at bedside  Disposition: telemetry   Labs   CBC: Recent Labs  Lab 06/20/20 2134 06/21/20 0500 06/22/20 0444 06/23/20 0649  WBC 19.1* 13.2* 13.6* 12.5*  NEUTROABS 15.1* 10.1* 10.5*  --   HGB 9.6* 8.7* 8.1* 8.4*  HCT 30.7* 28.3* 26.0* 27.0*  MCV 79.5* 78.6*  77.6* 78.0*  PLT 673* 537* 433* 494*    Basic Metabolic Panel: Recent Labs  Lab 06/20/20 2134 06/21/20 0500 06/22/20 0444 06/23/20 0649  NA 129* 132* 129* 132*  K 3.6 3.1* 3.2* 4.1  CL 89* 97* 97* 100  CO2 25 25 25 25   GLUCOSE 88 91 109* 96  BUN 11 8 5* 6*  CREATININE 0.69 0.50* 0.38* 0.37*  CALCIUM 8.9 8.1* 7.9* 8.1*  MG  --  1.6* 1.7  --   PHOS  --  3.9  --   --    GFR: Estimated Creatinine Clearance: 76.5 mL/min (A) (by C-G formula based on  SCr of 0.37 mg/dL (L)). Recent Labs  Lab 06/20/20 2134 06/20/20 2147 06/20/20 2319 06/21/20 0500 06/22/20 0444 06/23/20 0649  PROCALCITON  --   --   --  0.15  --   --   WBC 19.1*  --   --  13.2* 13.6* 12.5*  LATICACIDVEN  --  1.0 0.8  --   --   --     Liver Function Tests: Recent Labs  Lab 06/20/20 2134 06/21/20 0500 06/22/20 0444  AST 22 22 23   ALT 20 18 23   ALKPHOS 86 67 63  BILITOT 1.4* 0.7 0.2*  PROT 7.5 6.1* 5.9*  ALBUMIN 2.3* 1.8* 1.6*   Recent Labs  Lab 06/20/20 2134  LIPASE 19   No results for input(s): AMMONIA in the last 168 hours.  ABG No results found for: PHART, PCO2ART, PO2ART, HCO3, TCO2, ACIDBASEDEF, O2SAT   Coagulation Profile: Recent Labs  Lab 06/21/20 0500  INR 1.1    Cardiac Enzymes: No results for input(s): CKTOTAL, CKMB, CKMBINDEX, TROPONINI in the last 168 hours.  HbA1C: No results found for: HGBA1C  CBG: No results for input(s): GLUCAP in the last 168 hours.  Review of Systems:   Right toe hurts. Sweats better.   Past Medical History  He,  has a past medical history of Hypertension and Kidney stone.   Surgical History   History reviewed. No pertinent surgical history.   Social History   reports that he has been smoking. He has been smoking about 0.50 packs per day. He has never used smokeless tobacco. He reports current alcohol use of about 35.0 standard drinks of alcohol per week. He reports that he does not use drugs.   Family History   His family history includes Breast cancer in his mother.   Allergies Allergies  Allergen Reactions  . Amlodipine Swelling  . Telmisartan Other (See Comments)    Facial; angioedema.      Home Medications  Prior to Admission medications   Medication Sig Start Date End Date Taking? Authorizing Provider  diphenhydrAMINE (BENADRYL) 25 mg capsule Take 25 mg by mouth in the morning, at noon, and at bedtime.   Yes [provider]  guaifenesin (ROBITUSSIN) 100 MG/5ML syrup Take  200 mg by mouth 2 times daily at 12 noon and 4 pm.   Yes [provider]  hydrochlorothiazide (HYDRODIURIL) 25 MG tablet Take 1 tablet (25 mg total) by mouth daily. 08/06/12  Yes Earley FavorSchulz, Gail, NP  ibuprofen (ADVIL) 200 MG tablet Take 400 mg by mouth every 6 (six) hours as needed for moderate pain.   Yes [provider]  albuterol (PROAIR HFA) 108 (90 Base) MCG/ACT inhaler Inhale 1-2 puffs into the lungs every 6 (six) hours as needed for wheezing or shortness of breath. Patient not taking: Reported on 06/21/2020 02/15/20   Georgetta HaberBurky, Natalie B, NP  benzonatate (TESSALON) 100 MG capsule Take 1-2 capsules (100-200 mg total) by mouth 3 (three) times daily as needed for cough. Patient not taking: Reported on 06/21/2020 02/15/20   Georgetta Haber, NP

## 2020-06-24 ENCOUNTER — Encounter (HOSPITAL_COMMUNITY): Payer: Self-pay | Admitting: Internal Medicine

## 2020-06-24 ENCOUNTER — Inpatient Hospital Stay (HOSPITAL_COMMUNITY): Payer: BLUE CROSS/BLUE SHIELD

## 2020-06-24 DIAGNOSIS — J869 Pyothorax without fistula: Secondary | ICD-10-CM

## 2020-06-24 DIAGNOSIS — I70235 Atherosclerosis of native arteries of right leg with ulceration of other part of foot: Secondary | ICD-10-CM

## 2020-06-24 LAB — CULTURE, BLOOD (ROUTINE X 2)

## 2020-06-24 LAB — CBC
HCT: 25.4 % — ABNORMAL LOW (ref 39.0–52.0)
Hemoglobin: 7.9 g/dL — ABNORMAL LOW (ref 13.0–17.0)
MCH: 24.2 pg — ABNORMAL LOW (ref 26.0–34.0)
MCHC: 31.1 g/dL (ref 30.0–36.0)
MCV: 77.9 fL — ABNORMAL LOW (ref 80.0–100.0)
Platelets: 515 10*3/uL — ABNORMAL HIGH (ref 150–400)
RBC: 3.26 MIL/uL — ABNORMAL LOW (ref 4.22–5.81)
RDW: 15.9 % — ABNORMAL HIGH (ref 11.5–15.5)
WBC: 11.3 10*3/uL — ABNORMAL HIGH (ref 4.0–10.5)
nRBC: 0 % (ref 0.0–0.2)

## 2020-06-24 LAB — C-REACTIVE PROTEIN: CRP: 13.5 mg/dL — ABNORMAL HIGH (ref <1.0)

## 2020-06-24 LAB — ACID FAST SMEAR (AFB, MYCOBACTERIA): Acid Fast Smear: NEGATIVE

## 2020-06-24 LAB — MTB RIF NAA W/O CULTURE, SPUTUM

## 2020-06-24 LAB — PROTIME-INR
INR: 1.1 (ref 0.8–1.2)
Prothrombin Time: 13.4 seconds (ref 11.4–15.2)

## 2020-06-24 MED ORDER — FENTANYL CITRATE (PF) 100 MCG/2ML IJ SOLN
INTRAMUSCULAR | Status: AC | PRN
Start: 2020-06-24 — End: 2020-06-24
  Administered 2020-06-24 (×2): 25 ug via INTRAVENOUS

## 2020-06-24 MED ORDER — FENTANYL CITRATE (PF) 100 MCG/2ML IJ SOLN
INTRAMUSCULAR | Status: AC
  Filled 2020-06-24: qty 2

## 2020-06-24 MED ORDER — MIDAZOLAM HCL 2 MG/2ML IJ SOLN
INTRAMUSCULAR | Status: AC | PRN
  Administered 2020-06-24: 1 mg via INTRAVENOUS
  Administered 2020-06-24: 0.5 mg via INTRAVENOUS

## 2020-06-24 MED ORDER — MIDAZOLAM HCL 2 MG/2ML IJ SOLN
INTRAMUSCULAR | Status: AC
  Filled 2020-06-24: qty 2

## 2020-06-24 NOTE — Progress Notes (Addendum)
Patient declined sacral Mepilex dressing at this time. He took the previous one off and stated he may agree to have another one put on after his chest tube placement, but not at this time.

## 2020-06-24 NOTE — Consult Note (Addendum)
[facility] [place] CONSULT NOTE  VASCULAR SURGERY ASSESSMENT & PLAN:   PERIPHERAL VASCULAR DISEASE WITH TOE ULCERS RIGHT FOOT: This patient has critical limb ischemia with evidence of infrainguinal arterial occlusive disease on exam and nonhealing wounds on the right fourth and fifth toes.  I think without revascularization he is at high risk for limb loss.  Noninvasive studies show an ABI of 53% on the right with a toe pressure of 14 mmHg.  Thus I do not think he has adequate circulation to heal these wounds.  I have recommended arteriography to further evaluate his options.  We could potentially do this on Friday.  His renal function is normal.  PNEUMONIA/EMPYEMA: The patient is just back from having a 16 French drain placed into the empyema in his right chest.  His chest x-ray shows a right-sided pneumonia.  His Covid test was negative.  He is currently on vancomycin and meropenem cultures are pending.  Waverly Ferrari, MD Office: [phone]   MRN : [id number]  Reason for Consult: non healing ulcers right 4th and 5th toes. Referring Physician:   History of Present Illness: Present to local urgent care with progressive right foot pain and non healing ulcers on 4th and 5th toes.  ABI were done yesterday and shoes monophasic flow with right LE ABI 0.53 and TBI 0.11.  We have been consulted for further work of PAD with non healing wounds on the right LE.   An x-ray of the right foot performed which revealed no evidence of osteomyelitis.  Initial laboratory work revealed substantial leukocytosis WBC 12.5 and tachy cardia.  Chest CT revealed  Cavitary lung lesions concerning for lung abscess/empyema is admitted for sepsis secondary to pneumonia/lung abscess.  He has been started on IV antibiotics.   Past medical history includes: HTN and tobacco abuse.     Current Facility-Administered Medications  Medication Dose Route Frequency Provider Last Rate Last Admin  . 0.9 %   sodium chloride infusion   Intravenous PRN Rolly Salter, MD   Stopped at 06/24/20 0244  . acetaminophen (TYLENOL) tablet 650 mg  650 mg Oral Q6H PRN Marinda Elk, MD   650 mg at 06/22/20 0451   Or  . acetaminophen (TYLENOL) suppository 650 mg  650 mg Rectal Q6H PRN Shalhoub, Deno Lunger, MD      . albuterol (VENTOLIN HFA) 108 (90 Base) MCG/ACT inhaler 2 puff  2 puff Inhalation Q4H PRN Shalhoub, Deno Lunger, MD      . benzonatate (TESSALON) capsule 100 mg  100 mg Oral TID Rolly Salter, MD   100 mg at 06/24/20 0950  . feeding supplement (ENSURE ENLIVE / ENSURE PLUS) liquid 237 mL  237 mL Oral BID BM Rolly Salter, MD      . ferrous sulfate tablet 325 mg  325 mg Oral TID WC Rolly Salter, MD   325 mg at 06/24/20 1153  . guaiFENesin (MUCINEX) 12 hr tablet 600 mg  600 mg Oral BID Selmer Dominion B, NP   600 mg at 06/24/20 0950  . hydrALAZINE (APRESOLINE) injection 10 mg  10 mg Intravenous Q6H PRN Shalhoub, Deno Lunger, MD      . HYDROcodone-acetaminophen (NORCO/VICODIN) 5-325 MG per tablet 1 tablet  1 tablet Oral Q6H PRN Rolly Salter, MD   1 tablet at 06/23/20 2302  . meropenem (MERREM) 1 g in sodium chloride 0.9 % 100 mL IVPB  1 g Intravenous Q8H Shalhoub, Deno Lunger, MD 200 mL/hr at  06/24/20 1307 1 g at 06/24/20 1307  . multivitamin with minerals tablet 1 tablet  1 tablet Oral Daily Rolly Salter, MD   1 tablet at 06/24/20 0950  . nicotine (NICODERM CQ - dosed in mg/24 hours) patch 14 mg  14 mg Transdermal Daily Shalhoub, Deno Lunger, MD   14 mg at 06/24/20 0950  . ondansetron (ZOFRAN) tablet 4 mg  4 mg Oral Q6H PRN Shalhoub, Deno Lunger, MD       Or  . ondansetron [facility]) injection 4 mg  4 mg Intravenous Q6H PRN Shalhoub, Deno Lunger, MD      . polyethylene glycol (MIRALAX / GLYCOLAX) packet 17 g  17 g Oral Daily PRN Shalhoub, Deno Lunger, MD      . vancomycin (VANCOCIN) IVPB 1000 mg/200 mL premix  1,000 mg Intravenous Q12H Leonia Reader, RPH 200 mL/hr at 06/24/20 0244 1,000 mg at 06/24/20 0244    . vitamin B-12 (CYANOCOBALAMIN) tablet 1,000 mcg  1,000 mcg Oral Daily Rolly Salter, MD   1,000 mcg at 06/24/20 0953    Pt meds include: Statin :No Betablocker: No ASA: No Other anticoagulants/antiplatelets: none  Past Medical History:  Diagnosis Date  . Hypertension   . Iron deficiency anemia   . Kidney stone   . Malnutrition (HCC)   . PVD (peripheral vascular disease) (HCC)   . Tobacco abuse     History reviewed. No pertinent surgical history.  Social History Social History   Tobacco Use  . Smoking status: Current Every Day Smoker    Packs/day: 0.50  . Smokeless tobacco: Never Used  Vaping Use  . Vaping Use: Never used  Substance Use Topics  . Alcohol use: Yes    Alcohol/week: 35.0 standard drinks    Types: 35 Cans of beer per week  . Drug use: No    Family History Family History  Problem Relation Age of Onset  . Breast cancer Mother     Allergies  Allergen Reactions  . Amlodipine Swelling  . Telmisartan Other (See Comments)    Facial; angioedema.      REVIEW OF SYSTEMS  General: [ ]  Weight loss, [ ]  Fever, [ ]  chills Neurologic: [ ]  Dizziness, [ ]  Blackouts, [ ]  Seizure [ ]  Stroke, [ ]  "Mini stroke", [ ]  Slurred speech, [ ]  Temporary blindness; [ ]  weakness in arms or legs, [ ]  Hoarseness [ ]  Dysphagia Cardiac: [ ]  Chest pain/pressure, [ ]  Shortness of breath at rest [ ]  Shortness of breath with exertion, [ ]  Atrial fibrillation or irregular heartbeat  Vascular: [ ]  Pain in legs with walking, [ ]  Pain in legs at rest, [ ]  Pain in legs at night,  [ ]  Non-healing ulcer, [ ]  Blood clot in vein/DVT,   Pulmonary: [ ]  Home oxygen, [ ]  Productive cough, [ ]  Coughing up blood, [ ]  Asthma,  [ ]  Wheezing [ ]  COPD Musculoskeletal:  [ ]  Arthritis, [ ]  Low back pain, [ ]  Joint pain Hematologic: [ ]  Easy Bruising, [ ]  Anemia; [ ]  Hepatitis Gastrointestinal: [ ]  Blood in stool, [ ]  Gastroesophageal Reflux/heartburn, Urinary: [ ]  chronic Kidney disease, [ ]  on  HD - [ ]  MWF or [ ]  TTHS, [ ]  Burning with urination, [ ]  Difficulty urinating Skin: [ ]  Rashes, [ ]  Wounds Psychological: [ ]  Anxiety, [ ]  Depression  Physical Examination Vitals:   06/23/20 1303 06/23/20 2029 06/24/20 0420 06/24/20 0500  BP: 115/74 133/74 118/71   Pulse: 83  86   Resp:  18 16   Temp: 98.3 F (36.8 C) 98.5 F (36.9 C) 98.6 F (37 C)   TempSrc: Oral Oral Oral   SpO2: 99% 98% 96%   Weight:    55.5 kg  Height:       Body mass index is 18.87 kg/m.  General:  WDWN in NAD Gait: Normal HENT: WNL Eyes: Pupils equal Pulmonary: normal non-labored breathing , without Rales, rhonchi,  wheezing Cardiac: RRR, without  Murmurs, rubs or gallops; No carotid bruits Abdomen: soft, NT, no masses Skin: no rashes, ulcers noted;  no Gangrene , no cellulitis; no open wounds;   Vascular Exam/Pulses:Palpable radial pulses, palpable femoral pulses B LE        Musculoskeletal: no muscle wasting or atrophy; no edema  Neurologic: A&O X 3; Appropriate Affect ;  SENSATION: normal; MOTOR FUNCTION: 5/5 Symmetric Speech is fluent/normal   Significant Diagnostic Studies: CBC Lab Results  Component Value Date   WBC 11.3 (H) 06/24/2020   HGB 7.9 (L) 06/24/2020   HCT 25.4 (L) 06/24/2020   MCV 77.9 (L) 06/24/2020   PLT 515 (H) 06/24/2020    BMET    Component Value Date/Time   NA 132 (L) 06/23/2020 0649   K 4.1 06/23/2020 0649   CL 100 06/23/2020 0649   CO2 25 06/23/2020 0649   GLUCOSE 96 06/23/2020 0649   BUN 6 (L) 06/23/2020 0649   CREATININE 0.37 (L) 06/23/2020 0649   CALCIUM 8.1 (L) 06/23/2020 0649   GFRNONAA >60 06/23/2020 0649   Estimated Creatinine Clearance: 76.1 mL/min (A) (by C-G formula based on SCr of 0.37 mg/dL (L)).  COAG Lab Results  Component Value Date   INR 1.1 06/24/2020   INR 1.1 06/21/2020     Non-Invasive Vascular Imaging:    ABI Findings:  +---------+------------------+-----+----------+--------+  Right  Rt Pressure  (mmHg)IndexWaveform Comment   +---------+------------------+-----+----------+--------+  Brachial 129           triphasic       +---------+------------------+-----+----------+--------+  PTA   53        0.41 monophasic      +---------+------------------+-----+----------+--------+  DP    69        0.53 monophasic      +---------+------------------+-----+----------+--------+  Great Toe14        0.11            +---------+------------------+-----+----------+--------+   +---------+------------------+-----+----------+-------+  Left   Lt Pressure (mmHg)IndexWaveform Comment  +---------+------------------+-----+----------+-------+  Brachial 120           triphasic       +---------+------------------+-----+----------+-------+  PTA   76        0.59 monophasic      +---------+------------------+-----+----------+-------+  PERO   77        0.60 monophasic      +---------+------------------+-----+----------+-------+  Great Toe87        0.67            +---------+------------------+-----+----------+-------+   +-------+-----------+-----------+------------+------------+  ABI/TBIToday's ABIToday's TBIPrevious ABIPrevious TBI  +-------+-----------+-----------+------------+------------+  Right 0.53    0.11                  +-------+-----------+-----------+------------+------------+  Left  0.60    0.67                  +-------+-----------+-----------+------------+------------+       Summary:  Right: Resting right ankle-brachial index indicates moderate right lower  extremity arterial disease. The right toe-brachial index is abnormal.   Left: Resting left  ankle-brachial index indicates moderate left lower  extremity arterial disease. The left toe-brachial index is  abnormal.   ASSESSMENT/PLAN:   Cavitary lung lesions concerning for lung abscess/empyema is admitted for sepsis secondary to pneumonia/lung abscess.  Plan for chest tube placement.    PAD with non healing wounds right 4th and 5th toes after wearing tight shoes.  He will need an angiogram with runoff and possible intervention of the right LE.      Mosetta Pigeon 06/24/2020 1:27 PM

## 2020-06-24 NOTE — Progress Notes (Signed)
Collected last sputum sample for acid fast smear and sent to lab. Dr. Allena Katz stated that if this final result is negative, airborne precautions can be discontinued. Patient received chest tube placement this afternoon. Returned to floor in some discomfort, vicodin given with moderate results. Encouraged patient to take pain med when available, to help with initial inflammation post procedure. Chest tube placed to right upper posterior/flank, draining pink cloudy drainage to suction at -20. Currently there is 55mL of drainage in system. There is a postop dressing in place at chest tube insertion sight, so this was unable to be visualized upon his return. Vascular surgeon also saw patient this afternoon and the tentative plan is for a dye study/run off with possible stent placement on Friday 11/26. Call light in reach, no additional needs at this time.

## 2020-06-24 NOTE — Progress Notes (Signed)
[facility] Progress Note  Patient: Kirk Carrillo    QHU:[id number]  DOA: 06/20/2020     Date of Service: the patient was seen and examined on 06/24/2020  Brief hospital course: PMH of HT, active smoker, alcohol use without abuse presents with right leg pain and found to have multifocal necrotic lung cavities. PCCM, CT surgery, IR as well as vascular surgery Consulted  Currently plan is current work-up and treatment.  Assessment and Plan: 1. Multiple right sided pulmonary cavitationsand Rightloculated pleural effusion  Sepsis POA due to pneumonia Weight loss Night sweats.  Procal mildly elevated. No fever or chills.  Met SIRS criteria on admission with tachycardia and leukocytosis. Covid negative.  X ray chest shows right sided pneumonia.  CT chest shows multiple areas of necrosis and possible abscess along with loculated pleural effusion on right.  Pt refuses chocking or ETOH abuse.  Pt refused any incarceration or travel to high endemic TB areas. Denies any immunosuppressive meds. Also denies significant alcohol abuse.  Pt reports significant weight loss and drenching night sweats needing to change clothes. PCCM consulted. Appreciate input.  Underwent IR guided drainage of loculated pleural effusion.   On vancomycin and meropenem.  Follow up on culture.  CT surgery consulted, recommend CT-guided chest tube placement.  Follow-up on further recommendation from CT surgery as well as PCCM and IR for management of the chest tube.  2. Hypokalemia , hypomagnesemia  Continue to monitor.  Now normal.  3. Hyponatremia, from poor PO intake Sodium initially improving with hydration. Monitor.  4. Iron deficiency anemia  Iron low.,will start oral iron. Not giving IV iron due to concerns with infection   5. b12 deficiency  replace  6. Moderate protein calorie malnutrition Add supplements   7.  PVD. Right leg ulcer on fourth and fifth toe. Left trophic ulcer due to  pressure between the toes with worsening likely secondary to PVD. Right ABI 0.5.  Left ABI 0.6.  Vascular surgery consulted. Appreciate assistance. Patient will require runoff to evaluate right lower extremity vasculature. Currently continue dressing changes. No evidence of osteomyelitis on x-ray.  Currently no indication for MRI.  8.  Concern for tuberculosis. TB gold is negative.  AFB smear x2 is negative.  MTB NAA antigen is negative.  One more AFB smear is pending.  Patient can easily come out of the isolation after that is negative.  Body mass index is 18.87 kg/m.   Diet: regular diet DVT Prophylaxis: Subcutaneous Heparin   SCDs Start: 06/21/20 0137    Advance goals of care discussion: Full code  Family Communication: no family was present at bedside, at the time of interview.   Disposition:  Status is: Inpatient  Remains inpatient appropriate because:Ongoing diagnostic testing needed not appropriate for outpatient work up  Dispo: The patient is from: Home              Anticipated d/c is to: Home              Anticipated d/c date is: 3 days              Patient currently is not medically stable to d/c.  Subjective: No nausea no vomiting.  No fever no chills.  Reported diarrhea with Ensure.  Physical Exam:  General: Appear in mild distress, no Rash; Oral Mucosa Clear, moist. no Abnormal Neck Mass Or lumps, Conjunctiva normal  Cardiovascular: S1 and S2 Present, no Murmur, Respiratory: good respiratory effort, Bilateral Air entry present and right Crackles, no wheezes  Abdomen: Bowel Sound present, Soft and no tenderness Extremities: no Pedal edema Neurology: alert and oriented to time, place, and person affect appropriate. no new focal deficit Gait not checked due to patient safety concerns  Vitals:   06/24/20 1530 06/24/20 1535 06/24/20 1550 06/24/20 1954  BP: 116/72 (!) 126/93 115/64 108/73  Pulse: 94 95 94 82  Resp: 15 (!) '21 20 18  ' Temp:    98.1 F (36.7 C)    TempSrc:    Oral  SpO2: 100% 100% 97% 97%  Weight:      Height:        Intake/Output Summary (Last 24 hours) at 06/24/2020 2026 Last data filed at 06/24/2020 2000 Gross per 24 hour  Intake 1048.49 ml  Output 977 ml  Net 71.49 ml   Filed Weights   06/22/20 0434 06/23/20 0422 06/24/20 0500  Weight: 56.6 kg 55.8 kg 55.5 kg    Data Reviewed: I have personally reviewed and interpreted daily labs, tele strips, imagings as discussed above. I reviewed all nursing notes, pharmacy notes, vitals, pertinent old records I have discussed plan of care as described above with RN and patient/family.  CBC: Recent Labs  Lab 06/20/20 2134 06/21/20 0500 06/22/20 0444 06/23/20 0649 06/24/20 0347  WBC 19.1* 13.2* 13.6* 12.5* 11.3*  NEUTROABS 15.1* 10.1* 10.5*  --   --   HGB 9.6* 8.7* 8.1* 8.4* 7.9*  HCT 30.7* 28.3* 26.0* 27.0* 25.4*  MCV 79.5* 78.6* 77.6* 78.0* 77.9*  PLT 673* 537* 433* 494* 583*   Basic Metabolic Panel: Recent Labs  Lab 06/20/20 2134 06/21/20 0500 06/22/20 0444 06/23/20 0649  NA 129* 132* 129* 132*  K 3.6 3.1* 3.2* 4.1  CL 89* 97* 97* 100  CO2 '25 25 25 25  ' GLUCOSE 88 91 109* 96  BUN 11 8 5* 6*  CREATININE 0.69 0.50* 0.38* 0.37*  CALCIUM 8.9 8.1* 7.9* 8.1*  MG  --  1.6* 1.7  --   PHOS  --  3.9  --   --     Studies: CT PERC PLEURAL DRAIN W/INDWELL CATH W/IMG GUIDE  Result Date: 06/24/2020 INDICATION: 59 year old with a loculated right pleural effusion and only a small amount of fluid could be removed during thoracentesis. Request for a large-bore chest tube. Patient also has cavitary lesions in the right lung. EXAM: CT-GUIDED RIGHT CHEST TUBE PLACEMENT MEDICATIONS: Moderate sedation ANESTHESIA/SEDATION: Fentanyl 50 mcg IV; Versed 1.5 mg IV Moderate Sedation Time:  23 minutes The patient was continuously monitored during the procedure by the interventional radiology nurse under my direct supervision. COMPLICATIONS: None immediate. PROCEDURE: Informed written  consent was obtained from the patient after a thorough discussion of the procedural risks, benefits and alternatives. All questions were addressed. Maximal Sterile Barrier Technique was utilized including caps, mask, sterile gowns, sterile gloves, sterile drape, hand hygiene and skin antiseptic. A timeout was performed prior to the initiation of the procedure. Patient was placed prone. CT images through the chest were obtained. The loculated posterior right pleural effusion was identified and targeted. The right side of the back was prepped with chlorhexidine and sterile field was created. Skin was anesthetized using 1% lidocaine. Using CT guidance, a Yueh catheter was directed into the loculated effusion and a small amount of yellow fluid was aspirated. A J wire was advanced into the pleural space and the tract was dilated to accommodate a 16 [place] Thal- Quick drain. Drain was advanced over the wire. Approximately 20 mL of cloudy grayish colored purulent fluid was aspirated.  Drain was sutured in place and attached to a chest drainage system. Minimal fluid was draining after placement to the suction. FINDINGS: Loculated posterior right pleural effusion. 50 French drain was successfully placed within the loculated effusion. Approximately 20 mL of purulent looking fluid was removed and findings are suggestive for an empyema. IMPRESSION: CT-guided placement of a chest tube within the right chest empyema. Fluid was sent for culture. Electronically Signed   By: Markus Daft M.D.   On: 06/24/2020 17:22    Scheduled Meds: . benzonatate  100 mg Oral TID  . feeding supplement  237 mL Oral BID BM  . ferrous sulfate  325 mg Oral TID WC  . guaiFENesin  600 mg Oral BID  . multivitamin with minerals  1 tablet Oral Daily  . nicotine  14 mg Transdermal Daily  . vitamin B-12  1,000 mcg Oral Daily   Continuous Infusions: . sodium chloride Stopped (06/24/20 0244)  . meropenem (MERREM) IV 1 g (06/24/20 1307)  . vancomycin  1,000 mg (06/24/20 1609)   PRN Meds: sodium chloride, acetaminophen **OR** acetaminophen, albuterol, hydrALAZINE, HYDROcodone-acetaminophen, ondansetron **OR** ondansetron (ZOFRAN) IV, polyethylene glycol  Time spent: 35 minutes  Author: Berle Mull, MD [facility] 06/24/2020 8:26 PM  To reach On-call, see care teams to locate the attending and reach out via [email or web address]. Between 7PM-7AM, please contact night-coverage If you still have difficulty reaching the attending provider, please page the [facility] (Director on Call) for [facility] on amion for assistance.

## 2020-06-24 NOTE — Consult Note (Signed)
Chief Complaint: Patient was seen in consultation today for right loculated pleural effusion/right chest tube placement.  Referring Physician(s): Darlyn Read)  Supervising Physician: Markus Daft  Patient Status: [facility] - In-pt  History of Present Illness: Kirk Carrillo is a 59 y.o. male with a past medical history of hypertension, PVD, nephrolithiasis, iron deficiency anemia, and tobacco abuse. He was referred to [facility] ED from [place] urgent [facility] 06/20/2020 for management of ulcerations of 4th and 5th right toes. In ED, DG right foot without evidence of osteomyelitis. Upon further questioning in ED, patient found to have progressive cough with foul-smelling sputum, along with a 20 pound unintentional weight loss, over the past 6 months- he met SIRS criteria in ED. CT chest revealed multiple cavitary lesions throughout right lung. CCM was consulted and he was admitted for further management. He underwent IR right thoracentesis 06/22/2020, reveling loculated right pleural effusion and yielding 15 mL. TCTS was consulted who recommended IR consult for possible right chest tube placement.  CT chest 06/20/2020: 1. Multiple areas of cavitation with extensive reticular change and architectural distortion throughout the right lung, predominantly in the right upper lobe with associated areas of traction bronchiectasis. Several areas of cavitation appear more thick-walled and peripherally enhancing containing a admixed fluid and foci of gas concerning for superinfection and or abscess formation. Loculated areas of right pleural effusion visceral and parietal pleural thickening worrisome for empyema as well. While this appearance could reflect sequela of a necrotic pneumonia with empyema, underlying malignancy is not fully excluded. 2. Mediastinal and right hilar adenopathy, possibly reactive or metastatic. 3. Circumferential thickening and mucosal hyperemia of the distal  esophagus, correlate for features of esophagitis. Consider further evaluation with direct visualization. 4. Small sliding-type hiatal hernia with distal esophageal thickening and mucosal hyperemia, correlate for features of reflux. 5. Three-vessel coronary artery atherosclerosis. 6. Aortic Atherosclerosis (ICD10-I70.0).  IR consulted by Dr. Servando Snare for possible image-guided right chest tube placement. Patient awake and alert sitting in bed with no complaints at this time. Wife at bedside. Denies fever, chills, chest pain, dyspnea, abdominal pain, or headache.   Past Medical History:  Diagnosis Date  . Hypertension   . Iron deficiency anemia   . Kidney stone   . Malnutrition ([place])   . PVD (peripheral vascular disease) ([place])   . Tobacco abuse     History reviewed. No pertinent surgical history.  Allergies: Amlodipine and Telmisartan  Medications: Prior to Admission medications   Medication Sig Start Date End Date Taking? Authorizing Provider  diphenhydrAMINE (BENADRYL) 25 mg capsule Take 25 mg by mouth in the morning, at noon, and at bedtime.   Yes [provider]  guaifenesin (ROBITUSSIN) 100 MG/5ML syrup Take 200 mg by mouth 2 times daily at 12 noon and 4 pm.   Yes [provider]  hydrochlorothiazide (HYDRODIURIL) 25 MG tablet Take 1 tablet (25 mg total) by mouth daily. 08/06/12  Yes Junius Creamer, NP  ibuprofen (ADVIL) 200 MG tablet Take 400 mg by mouth every 6 (six) hours as needed for moderate pain.   Yes [provider]  albuterol (PROAIR HFA) 108 (90 Base) MCG/ACT inhaler Inhale 1-2 puffs into the lungs every 6 (six) hours as needed for wheezing or shortness of breath. Patient not taking: Reported on 06/21/2020 02/15/20   Zigmund Gottron, NP  benzonatate (TESSALON) 100 MG capsule Take 1-2 capsules (100-200 mg total) by mouth 3 (three) times daily as needed for cough. Patient not taking: Reported on  06/21/2020 02/15/20   Zigmund Gottron, NP      Family History  Problem Relation Age of Onset  . Breast cancer Mother     Social History   Socioeconomic History  . Marital status: Single    Spouse name: Not on file  . Number of children: Not on file  . Years of education: Not on file  . Highest education level: Not on file  Occupational History  . Not on file  Tobacco Use  . Smoking status: Current Every Day Smoker    Packs/day: 0.50  . Smokeless tobacco: Never Used  Vaping Use  . Vaping Use: Never used  Substance and Sexual Activity  . Alcohol use: Yes    Alcohol/week: 35.0 standard drinks    Types: 35 Cans of beer per week  . Drug use: No  . Sexual activity: Not on file  Other Topics Concern  . Not on file  Social History Narrative  . Not on file   Social Determinants of Health   Financial Resource Strain:   . Difficulty of Paying Living Expenses: Not on file  Food Insecurity:   . Worried About Charity fundraiser in the Last Year: Not on file  . Ran Out of Food in the Last Year: Not on file  Transportation Needs:   . Lack of Transportation (Medical): Not on file  . Lack of Transportation (Non-Medical): Not on file  Physical Activity:   . Days of Exercise per Week: Not on file  . Minutes of Exercise per Session: Not on file  Stress:   . Feeling of Stress : Not on file  Social Connections:   . Frequency of Communication with Friends and Family: Not on file  . Frequency of Social Gatherings with Friends and Family: Not on file  . Attends Religious Services: Not on file  . Active Member of Clubs or Organizations: Not on file  . Attends Archivist Meetings: Not on file  . Marital Status: Not on file     Review of Systems: A 12 point ROS discussed and pertinent positives are indicated in the HPI above.  All other systems are negative.  Review of Systems  Constitutional: Negative for chills and fever.  Respiratory: Negative for shortness of breath and wheezing.   Cardiovascular: Negative for  chest pain and palpitations.  Gastrointestinal: Negative for abdominal pain.  Neurological: Negative for headaches.  Psychiatric/Behavioral: Negative for behavioral problems and confusion.    Vital Signs: BP 118/71 (BP Location: Right Arm)   Pulse 86   Temp 98.6 F (37 C) (Oral)   Resp 16   Ht 5' 7.5" (1.715 m)   Wt 122 lb 4.8 oz (55.5 kg) Comment: scale C  SpO2 96%   BMI 18.87 kg/m   Physical Exam Vitals and nursing note reviewed.  Constitutional:      General: He is not in acute distress.    Appearance: Normal appearance.  Cardiovascular:     Rate and Rhythm: Normal rate and regular rhythm.     Heart sounds: Normal heart sounds. No murmur heard.   Pulmonary:     Effort: Pulmonary effort is normal. No respiratory distress.     Breath sounds: Normal breath sounds. No wheezing.  Skin:    General: Skin is warm and dry.  Neurological:     Mental Status: He is alert and oriented to person, place, and time.      MD Evaluation Airway: WNL Heart: WNL Abdomen: WNL Chest/  Lungs: WNL ASA  Classification: 3 Mallampati/Airway Score: Two   Imaging: DG Chest 2 View  Result Date: 06/20/2020 CLINICAL DATA:  Chronic cough, weight loss EXAM: CHEST - 2 VIEW COMPARISON:  None. FINDINGS: Heart is normal size. Patchy airspace disease throughout the right lung. Small right pleural effusion. Left lung clear. No acute bony abnormality. IMPRESSION: Volume loss with patchy airspace disease throughout the right lung. Cannot exclude pneumonia. Small right pleural effusion. Electronically Signed   By: Rolm Baptise M.D.   On: 06/20/2020 21:33   CT Chest W Contrast  Addendum Date: 06/21/2020   ADDENDUM REPORT: 06/21/2020 00:39 ADDENDUM: These results were called by telephone at the time of interpretation on 06/21/2020 at 12:38 am to provider Dr Marlyce Huge, who verbally acknowledged these results. Electronically Signed   By: Lovena Le M.D.   On: 06/21/2020 00:39   Result Date:  06/21/2020 CLINICAL DATA:  Abnormal radiograph, chronic cough, EXAM: CT CHEST WITH CONTRAST TECHNIQUE: Multidetector CT imaging of the chest was performed during intravenous contrast administration. CONTRAST:  27m OMNIPAQUE IOHEXOL 300 MG/ML  SOLN COMPARISON:  Same-day radiograph, CT abdomen pelvis 08/06/2012 FINDINGS: Cardiovascular: Cardiac size within normal limits. Three-vessel coronary artery atherosclerosis. There is a questionable region of direct involvement of the pericardium by the cavitary lung masses along the posterior aspect of the right atrium (3/96) with distinct loss of fat plane and some pericardial fat stranding. Atherosclerotic plaque within the normal caliber aorta. No acute luminal abnormality of the imaged aorta. No periaortic stranding or hemorrhage. Normal 3 vessel branching of the aortic arch. Proximal great vessels are calcified at the ostia but otherwise unremarkable. Central pulmonary arteries are normal caliber. There is some focal narrowing no large central filling defects are identified. More distal evaluation is limited by a non tailored technique and mild respiratory motion artifact. Mediastinum/Nodes: Mediastinal and right hilar adenopathy. Index nodes include 14 mm short axis precarinal lymph node (3/56) 15 mm short axis node versus soft tissue mass in the right hilum(3/59) 14 mm short axis high right paratracheal node (3/38) 13 mm short axis subcarinal node (3/70) Diffuse airways thickening throughout the central and more distal airways of the right lung. Soft tissue contiguous with the right paramediastinal border. Distal thoracic esophageal thickening and some mucosal hyperemia with a small sliding-type hiatal hernia. Thyroid gland and thoracic inlet are unremarkable. Lungs/Pleura: There are extensive areas of reticular change and architectural distortion throughout the right lung. Mixed areas of cavitation are present within this architectural distortion as well as other  more thick-walled peripherally enhancing regions of cavitation containing air and heterogeneous material concerning for superinfection or abscess formation. Largest is seen in the right lower lobe abutting the fissure and measuring up 5.7 x 5.1 x 4.9 cm (6/95) additional spiculated centrally hypoattenuating, possibly necrotic nodule seen in the right middle lobe measuring 2.2 x 2.2 x 1.8 cm (5/76). There is loculated right pleural effusion with marked visceral and parietal pleural thickening with the largest collection in the right lung base. Additional lobular fluid and pleural thickening along the periphery of the right lung and anteriorly. No clear extension into or through the chest wall. Left lung is predominantly clear aside from few scattered calcified granulomata. Upper Abdomen: Atherosclerotic calcifications in the upper abdomen. Distal esophageal thickening and hiatal hernia, as above. No other acute abnormalities in the upper abdomen. Musculoskeletal: Mild levocurvature of the lower thoracic spine, apex T11. Additional mild multilevel degenerative changes in the spine and shoulders. No acute or worrisome osseous lesion. IMPRESSION:  1. Multiple areas of cavitation with extensive reticular change and architectural distortion throughout the right lung, predominantly in the right upper lobe with associated areas of traction bronchiectasis. Several areas of cavitation appear more thick-walled and peripherally enhancing containing a admixed fluid and foci of gas concerning for superinfection and or abscess formation. Loculated areas of right pleural effusion visceral and parietal pleural thickening worrisome for empyema as well. While this appearance could reflect sequela of a necrotic pneumonia with empyema, underlying malignancy is not fully excluded. 2. Mediastinal and right hilar adenopathy, possibly reactive or metastatic. 3. Circumferential thickening and mucosal hyperemia of the distal esophagus,  correlate for features of esophagitis. Consider further evaluation with direct visualization. 4. Small sliding-type hiatal hernia with distal esophageal thickening and mucosal hyperemia, correlate for features of reflux. 5. Three-vessel coronary artery atherosclerosis. 6. Aortic Atherosclerosis (ICD10-I70.0). Electronically Signed: By: Lovena Le M.D. On: 06/21/2020 00:24   DG Chest Port 1 View  Result Date: 06/22/2020 CLINICAL DATA:  Post thoracentesis. EXAM: PORTABLE CHEST 1 VIEW COMPARISON:  06/20/2020 FINDINGS: Lungs are adequately inflated with heterogeneous density over the right mid to lower lung with slight interval improved aeration over the medial right base likely due to improved known loculated pleural effusion post thoracentesis. No pneumothorax. Left lung is clear. Remainder the exam is unchanged. IMPRESSION: 1. Improved aeration over the medial right base likely improved loculated pleural effusion post thoracentesis. No pneumothorax. 2. Persistent heterogeneous density over the right mid to lower lung. Electronically Signed   By: Marin Olp M.D.   On: 06/22/2020 12:16   DG Foot Complete Right  Result Date: 06/20/2020 CLINICAL DATA:  Deep wound lateral 4th and 5th toes. EXAM: RIGHT FOOT COMPLETE - 3+ VIEW COMPARISON:  None. FINDINGS: No acute bony abnormality. Specifically, no fracture, subluxation, or dislocation. No bone destruction to suggest osteomyelitis. No soft tissue gas. Joint spaces maintained. IMPRESSION: No acute bony abnormality. Electronically Signed   By: Rolm Baptise M.D.   On: 06/20/2020 21:32   VAS [place] ABI WITH/WO TBI  Result Date: 06/23/2020 LOWER EXTREMITY DOPPLER STUDY Indications: Claudication, ulceration, and peripheral artery disease. High Risk Factors: Current smoker. Other Factors: Has known PAD, prior workup at the [place].  Comparison Study: No prior study on file Performing Technologist: Sharion Dove RVS  Examination Guidelines: A complete evaluation  includes at minimum, Doppler waveform signals and systolic blood pressure reading at the level of bilateral brachial, anterior tibial, and posterior tibial arteries, when vessel segments are accessible. Bilateral testing is considered an integral part of a complete examination. Photoelectric Plethysmograph (PPG) waveforms and toe systolic pressure readings are included as required and additional duplex testing as needed. Limited examinations for reoccurring indications may be performed as noted.  ABI Findings: +---------+------------------+-----+----------+--------+ Right    Rt Pressure (mmHg)IndexWaveform  Comment  +---------+------------------+-----+----------+--------+ Brachial 129                    triphasic          +---------+------------------+-----+----------+--------+ PTA      53                0.41 monophasic         +---------+------------------+-----+----------+--------+ DP       69                0.53 monophasic         +---------+------------------+-----+----------+--------+ Great Toe14                0.11                    +---------+------------------+-----+----------+--------+ +---------+------------------+-----+----------+-------+  Left     Lt Pressure (mmHg)IndexWaveform  Comment +---------+------------------+-----+----------+-------+ Brachial 120                    triphasic         +---------+------------------+-----+----------+-------+ PTA      76                0.59 monophasic        +---------+------------------+-----+----------+-------+ PERO     77                0.60 monophasic        +---------+------------------+-----+----------+-------+ Great Toe87                0.67                   +---------+------------------+-----+----------+-------+ +-------+-----------+-----------+------------+------------+ ABI/TBIToday's ABIToday's TBIPrevious ABIPrevious TBI +-------+-----------+-----------+------------+------------+ Right   0.53       0.11                                +-------+-----------+-----------+------------+------------+ Left   0.60       0.67                                +-------+-----------+-----------+------------+------------+  Summary: Right: Resting right ankle-brachial index indicates moderate right lower extremity arterial disease. The right toe-brachial index is abnormal. Left: Resting left ankle-brachial index indicates moderate left lower extremity arterial disease. The left toe-brachial index is abnormal.  *See table(s) above for measurements and observations.  Electronically signed by Monica Martinez MD on 06/23/2020 at 4:41:57 PM.    Final    US THORACENTESIS ASP PLEURAL SPACE W/IMG GUIDE  Result Date: 06/22/2020 INDICATION: Patient with history of sepsis secondary to pneumonia, dyspnea, and loculated right pleural effusion. Request is made for diagnostic and therapeutic right thoracentesis. EXAM: ULTRASOUND GUIDED DIAGNOSTIC AND THERAPEUTIC RIGHT THORACENTESIS MEDICATIONS: 10 mL 1% lidocaine COMPLICATIONS: None immediate. PROCEDURE: An ultrasound guided thoracentesis was thoroughly discussed with the patient and questions answered. The benefits, risks, alternatives and complications were also discussed. The patient understands and wishes to proceed with the procedure. Written consent was obtained. Ultrasound was performed to localize and mark an adequate pocket of loculated fluid in the right chest. The area was then prepped and draped in the normal sterile fashion. 1% Lidocaine was used for local anesthesia. Under ultrasound guidance a 6 Fr Safe-T-Centesis catheter was introduced. Thoracentesis was performed. The catheter was removed and a dressing applied. FINDINGS: A total of approximately 15 mL of clear yellow fluid was removed. Samples were sent to the laboratory as requested by the clinical team. Right pleural fluid is very loculated and only a small amount of fluid could be obtained.  IMPRESSION: Successful ultrasound guided right thoracentesis yielding 15 mL of pleural fluid. Right pleural effusion is very loculated. Read by: Earley Abide, PA-C Electronically Signed   By: Markus Daft M.D.   On: 06/22/2020 11:47    Labs:  CBC: Recent Labs    06/21/20 0500 06/22/20 0444 06/23/20 0649 06/24/20 0347  WBC 13.2* 13.6* 12.5* 11.3*  HGB 8.7* 8.1* 8.4* 7.9*  HCT 28.3* 26.0* 27.0* 25.4*  PLT 537* 433* 494* 515*    COAGS: Recent Labs    06/21/20 0500 06/24/20 0347  INR 1.1 1.1    BMP: Recent Labs    06/20/20 2134 06/21/20 0500 06/22/20 0444 06/23/20  0649  NA 129* 132* 129* 132*  K 3.6 3.1* 3.2* 4.1  CL 89* 97* 97* 100  CO2 '25 25 25 25  ' GLUCOSE 88 91 109* 96  BUN 11 8 5* 6*  CALCIUM 8.9 8.1* 7.9* 8.1*  CREATININE 0.69 0.50* 0.38* 0.37*  GFRNONAA >60 >60 >60 >60    LIVER FUNCTION TESTS: Recent Labs    06/20/20 2134 06/21/20 0500 06/22/20 0444  BILITOT 1.4* 0.7 0.2*  AST '22 22 23  ' ALT '20 18 23  ' ALKPHOS 86 67 63  PROT 7.5 6.1* 5.9*  ALBUMIN 2.3* 1.8* 1.6*     Assessment and Plan:  Right loculated pleural effusion. Plan for image-guided right chest tube placement in IR tentatively for today pending IR scheduling. Patient is NPO. Afebrile. He does not take blood thinners. INR 1.1 today.  Risks and benefits discussed with the patient including bleeding, infection, damage to adjacent structures, pneumothorax, and sepsis. All of the patient's questions were answered, patient is agreeable to proceed. Verbal consent obtained (by myself and 3E RN) secondary to airborne precautions- signed and in chart.   Thank you for this interesting consult.  I greatly enjoyed meeting Prakash Kimberling and look forward to participating in their care.  A copy of this report was sent to the requesting provider on this date.  Electronically Signed: Earley Abide, PA-C 06/24/2020, 9:21 AM   I spent a total of 40 Minutes in face to face in clinical  consultation, greater than 50% of which was counseling/coordinating care for right loculated pleural effusion/right chest tube placement.

## 2020-06-24 NOTE — Progress Notes (Signed)
Pt and wife reminded of [facility]'s visitation policy.

## 2020-06-24 NOTE — Progress Notes (Signed)
Patient alert and oriented x 4, resps even and unlabored. Lung sounds clear in upper lobes anteriorly, diminished posteriorly and in lower lobes. Patient on RA with no evidence of dyspnea or tachypnea. I have only heard him cough once; it sounded wet, but he did not produce any sputum with it. Bowel sounds in all quads, patient reports having BMs regularly every evening since admit. I encouraged him to notify staff when this happens, so we can document. Patient utilizes toilet ad lib without difficulty. He has no edema to BLE. Patient was admitted with a wound on his right lateral 4th toe and right medial 5th toe. This is curently being treated with aquacel daily, last change done by night shift early this am, so no visualization of areas at this time. Dressing is currently clean, dry and intact. Patient is scheduled for CT guided chest tube placement today and is therefore NPO. Consent obtained, radiology department unsure what time this will be done, as he remains on airborne precautions and he will need to be one of the last patients of the day. Patient denies pain. Spouse is at bedside. No needs at this time.

## 2020-06-24 NOTE — Procedures (Signed)
Interventional Radiology Procedure:   Indications: Right chest empyema  Procedure: CT guided chest tube placement  Findings: Loculated right pleural effusion, placed 16 Fr drain and removed 20 ml of purulent looking fluid.  Complications: None     EBL: less than 10 ml  Plan: Chest tube to drainage system.  Sent fluid for culture.     Treveon Bourcier R. Lowella Dandy, MD  Pager: [phone]

## 2020-06-24 NOTE — Progress Notes (Signed)
When PIV flushed prior to administration of Meropenem, it was found to be leaking. As such, a new 20g PIV inserted to the left forearm on first attempt, and meropenem administered through this. Patient tolerated well.

## 2020-06-25 ENCOUNTER — Inpatient Hospital Stay (HOSPITAL_COMMUNITY): Payer: BLUE CROSS/BLUE SHIELD

## 2020-06-25 DIAGNOSIS — J189 Pneumonia, unspecified organism: Secondary | ICD-10-CM | POA: Diagnosis not present

## 2020-06-25 DIAGNOSIS — A419 Sepsis, unspecified organism: Secondary | ICD-10-CM | POA: Diagnosis not present

## 2020-06-25 LAB — BODY FLUID CULTURE: Culture: NO GROWTH

## 2020-06-25 LAB — VANCOMYCIN, TROUGH: Vancomycin Tr: 8 ug/mL — ABNORMAL LOW (ref 15–20)

## 2020-06-25 LAB — BASIC METABOLIC PANEL
Anion gap: 8 (ref 5–15)
BUN: 6 mg/dL — ABNORMAL LOW (ref 8–23)
CO2: 25 mmol/L (ref 22–32)
Calcium: 8.5 mg/dL — ABNORMAL LOW (ref 8.9–10.3)
Chloride: 98 mmol/L (ref 98–111)
Creatinine, Ser: 0.37 mg/dL — ABNORMAL LOW (ref 0.61–1.24)
GFR, Estimated: >60 mL/min (ref >60)
Glucose, Bld: 99 mg/dL (ref 70–99)
Potassium: 4.3 mmol/L (ref 3.5–5.1)
Sodium: 131 mmol/L — ABNORMAL LOW (ref 135–145)

## 2020-06-25 LAB — CBC
HCT: 27.2 % — ABNORMAL LOW (ref 39.0–52.0)
Hemoglobin: 8.3 g/dL — ABNORMAL LOW (ref 13.0–17.0)
MCH: 24.1 pg — ABNORMAL LOW (ref 26.0–34.0)
MCHC: 30.5 g/dL (ref 30.0–36.0)
MCV: 78.8 fL — ABNORMAL LOW (ref 80.0–100.0)
Platelets: 548 10*3/uL — ABNORMAL HIGH (ref 150–400)
RBC: 3.45 MIL/uL — ABNORMAL LOW (ref 4.22–5.81)
RDW: 15.8 % — ABNORMAL HIGH (ref 11.5–15.5)
WBC: 9.2 10*3/uL (ref 4.0–10.5)
nRBC: 0 % (ref 0.0–0.2)

## 2020-06-25 LAB — ACID FAST SMEAR (AFB, MYCOBACTERIA): Acid Fast Smear: NEGATIVE

## 2020-06-25 LAB — C-REACTIVE PROTEIN: CRP: 17.8 mg/dL — ABNORMAL HIGH (ref <1.0)

## 2020-06-25 LAB — CULTURE, BLOOD (ROUTINE X 2)
Culture: NO GROWTH
Culture: NO GROWTH
Special Requests: ADEQUATE

## 2020-06-25 LAB — MAGNESIUM: Magnesium: 1.9 mg/dL (ref 1.7–2.4)

## 2020-06-25 MED ORDER — SODIUM CHLORIDE (PF) 0.9 % IJ SOLN
10.0000 mg | Freq: Two times a day (BID) | INTRAMUSCULAR | Status: DC
  Administered 2020-06-25: 10 mg via INTRAPLEURAL
  Filled 2020-06-25 (×3): qty 10

## 2020-06-25 MED ORDER — SODIUM CHLORIDE (PF) 0.9 % IJ SOLN
10.0000 mg | Freq: Every day | INTRAMUSCULAR | Status: DC
  Administered 2020-06-26: 10 mg via INTRAPLEURAL
  Filled 2020-06-25 (×2): qty 10

## 2020-06-25 MED ORDER — STERILE WATER FOR INJECTION IJ SOLN
5.0000 mg | RESPIRATORY_TRACT | Status: DC
  Administered 2020-06-26: 5 mg via INTRAPLEURAL
  Filled 2020-06-25 (×2): qty 5

## 2020-06-25 MED ORDER — STERILE WATER FOR INJECTION IJ SOLN
5.0000 mg | RESPIRATORY_TRACT | Status: DC
  Administered 2020-06-25: 5 mg via INTRAPLEURAL
  Filled 2020-06-25 (×2): qty 5

## 2020-06-25 MED ORDER — VANCOMYCIN HCL 1250 MG/250ML IV SOLN
1250.0000 mg | Freq: Two times a day (BID) | INTRAVENOUS | Status: DC
  Administered 2020-06-25 – 2020-06-27 (×4): 1250 mg via INTRAVENOUS
  Filled 2020-06-25 (×5): qty 250

## 2020-06-25 MED ORDER — HYDROCODONE-ACETAMINOPHEN 5-325 MG PO TABS
1.0000 | ORAL_TABLET | ORAL | Status: DC | PRN
  Administered 2020-06-25 – 2020-06-28 (×16): 1 via ORAL
  Filled 2020-06-25 (×16): qty 1

## 2020-06-25 NOTE — Progress Notes (Addendum)
[address]       [name] [address]             [phone]         Subjective: thrombplytics instilled this am per PCCM physician  Objective: Vital signs in last 24 hours: Temp:  [98.1 F (36.7 C)-98.6 F (37 C)] 98.6 F (37 C) (11/24 0400) Pulse Rate:  [82-97] 82 (11/23 1954) Cardiac Rhythm: Normal sinus rhythm (11/24 0818) Resp:  [15-21] 18 (11/24 0400) BP: (104-127)/(64-93) 120/71 (11/24 0400) SpO2:  [97 %-100 %] 99 % (11/24 0400) Weight:  [56.6 kg] 56.6 kg (11/24 0400)  Hemodynamic parameters for last 24 hours:    Intake/Output from previous day: 11/23 0701 - 11/24 0700 In: 1388.7 [P.O.:600; I.V.:73; IV Piggyback:715.7] Out: 1032 [Urine:900; Chest Tube:110] Intake/Output this shift: No intake/output data recorded.  General appearance: alert, cooperative and no distress Heart: regular rate and rhythm Lungs: clear to auscultation bilaterally  Lab Results: Recent Labs    06/24/20 0347 06/25/20 0340  WBC 11.3* 9.2  HGB 7.9* 8.3*  HCT 25.4* 27.2*  PLT 515* 548*   BMET:  Recent Labs    06/23/20 0649 06/25/20 0340  NA 132* 131*  K 4.1 4.3  CL 100 98  CO2 25 25  GLUCOSE 96 99  BUN 6* 6*  CREATININE 0.37* 0.37*  CALCIUM 8.1* 8.5*    PT/INR:  Recent Labs    06/24/20 0347  LABPROT 13.4  INR 1.1   ABG No results found for: PHART, HCO3, TCO2, ACIDBASEDEF, O2SAT CBG (last 3)  No results for input(s): GLUCAP in the last 72 hours.  Meds Scheduled Meds: . alteplase (TPA) for intrapleural administration  10 mg Intrapleural Q12H   And  . pulmozyme (DORNASE) for intrapleural administration  5 mg Intrapleural Q24H  . benzonatate  100 mg Oral TID  . feeding supplement  237 mL Oral BID BM  . ferrous sulfate  325 mg Oral TID WC  . guaiFENesin  600 mg Oral BID  . multivitamin with minerals  1 tablet Oral Daily  . nicotine  14 mg Transdermal Daily  . vitamin B-12  1,000 mcg Oral Daily   Continuous Infusions: . sodium  chloride Stopped (06/25/20 0337)  . meropenem (MERREM) IV 1 g (06/25/20 0455)  . vancomycin 200 mL/hr at 06/25/20 0400   PRN Meds:.sodium chloride, acetaminophen **OR** acetaminophen, albuterol, hydrALAZINE, HYDROcodone-acetaminophen, ondansetron **OR** ondansetron (ZOFRAN) IV, polyethylene glycol  Xrays DG Chest Port 1 View  Result Date: 06/25/2020 CLINICAL DATA:  PleurX catheter placement EXAM: PORTABLE CHEST 1 VIEW COMPARISON:  June 22, 2020 FINDINGS: PleurX catheter tip is in the right lower lung region. No pneumothorax. Small loculated pleural effusion noted on the right. There is ill-defined airspace opacity in the right mid and lower lung zones, primarily due to scarring and atelectatic change. There is slight atelectasis in the medial left base. There is a small granuloma in the left upper lobe. Left lung otherwise clear. Heart size and pulmonary vascular normal. No adenopathy. There is aortic atherosclerosis. There are foci of calcification in each carotid artery. No bone lesions. IMPRESSION: PleurX catheter with tip in right base region. No pneumothorax. Small loculated pleural effusion on the right. Areas of apparent scarring and atelectasis right mid and lower lung regions. Mild atelectasis medial left base. Scattered granulomas on the left. No edema or airspace opacity. Stable cardiac silhouette. Aortic Atherosclerosis (ICD10-I70.0). Electronically Signed   By: Bretta Bang III M.D.  On: 06/25/2020 08:19   I have independently reviewed the above radiology studies  and reviewed the findings with the patient.    VAS [place] ABI WITH/WO TBI  Result Date: 06/23/2020 LOWER EXTREMITY DOPPLER STUDY Indications: Claudication, ulceration, and peripheral artery disease. High Risk Factors: Current smoker. Other Factors: Has known PAD, prior workup at the [place].  Comparison Study: No prior study on file Performing Technologist: Sherren Kerns RVS  Examination Guidelines: A complete evaluation  includes at minimum, Doppler waveform signals and systolic blood pressure reading at the level of bilateral brachial, anterior tibial, and posterior tibial arteries, when vessel segments are accessible. Bilateral testing is considered an integral part of a complete examination. Photoelectric Plethysmograph (PPG) waveforms and toe systolic pressure readings are included as required and additional duplex testing as needed. Limited examinations for reoccurring indications may be performed as noted.  ABI Findings: +---------+------------------+-----+----------+--------+ Right    Rt Pressure (mmHg)IndexWaveform  Comment  +---------+------------------+-----+----------+--------+ Brachial 129                    triphasic          +---------+------------------+-----+----------+--------+ PTA      53                0.41 monophasic         +---------+------------------+-----+----------+--------+ DP       69                0.53 monophasic         +---------+------------------+-----+----------+--------+ Great Toe14                0.11                    +---------+------------------+-----+----------+--------+ +---------+------------------+-----+----------+-------+ Left     Lt Pressure (mmHg)IndexWaveform  Comment +---------+------------------+-----+----------+-------+ Brachial 120                    triphasic         +---------+------------------+-----+----------+-------+ PTA      76                0.59 monophasic        +---------+------------------+-----+----------+-------+ PERO     77                0.60 monophasic        +---------+------------------+-----+----------+-------+ Great Toe87                0.67                   +---------+------------------+-----+----------+-------+ +-------+-----------+-----------+------------+------------+ ABI/TBIToday's ABIToday's TBIPrevious ABIPrevious TBI +-------+-----------+-----------+------------+------------+ Right   0.53       0.11                                +-------+-----------+-----------+------------+------------+ Left   0.60       0.67                                +-------+-----------+-----------+------------+------------+  Summary: Right: Resting right ankle-brachial index indicates moderate right lower extremity arterial disease. The right toe-brachial index is abnormal. Left: Resting left ankle-brachial index indicates moderate left lower extremity arterial disease. The left toe-brachial index is abnormal.  *See table(s) above for measurements and observations.  Electronically signed by Sherald Hess MD on 06/23/2020 at 4:41:57 PM.    Final  CT PERC PLEURAL DRAIN W/INDWELL CATH W/IMG GUIDE  Result Date: 06/24/2020 INDICATION: 59 year old with a loculated right pleural effusion and only a small amount of fluid could be removed during thoracentesis. Request for a large-bore chest tube. Patient also has cavitary lesions in the right lung. EXAM: CT-GUIDED RIGHT CHEST TUBE PLACEMENT MEDICATIONS: Moderate sedation ANESTHESIA/SEDATION: Fentanyl 50 mcg IV; Versed 1.5 mg IV Moderate Sedation Time:  23 minutes The patient was continuously monitored during the procedure by the interventional radiology nurse under my direct supervision. COMPLICATIONS: None immediate. PROCEDURE: Informed written consent was obtained from the patient after a thorough discussion of the procedural risks, benefits and alternatives. All questions were addressed. Maximal Sterile Barrier Technique was utilized including caps, mask, sterile gowns, sterile gloves, sterile drape, hand hygiene and skin antiseptic. A timeout was performed prior to the initiation of the procedure. Patient was placed prone. CT images through the chest were obtained. The loculated posterior right pleural effusion was identified and targeted. The right side of the back was prepped with chlorhexidine and sterile field was created. Skin was anesthetized  using 1% lidocaine. Using CT guidance, a Yueh catheter was directed into the loculated effusion and a small amount of yellow fluid was aspirated. A J wire was advanced into the pleural space and the tract was dilated to accommodate a 16 [place] Thal- Quick drain. Drain was advanced over the wire. Approximately 20 mL of cloudy grayish colored purulent fluid was aspirated. Drain was sutured in place and attached to a chest drainage system. Minimal fluid was draining after placement to the suction. FINDINGS: Loculated posterior right pleural effusion. 16 French drain was successfully placed within the loculated effusion. Approximately 20 mL of purulent looking fluid was removed and findings are suggestive for an empyema. IMPRESSION: CT-guided placement of a chest tube within the right chest empyema. Fluid was sent for culture. Electronically Signed   By: Richarda OverlieAdam  Henn M.D.   On: 06/24/2020 17:22    Results for orders placed or performed during the hospital encounter of 06/20/20  Blood culture (routine x 2)     Status: None   Collection Time: 06/20/20  9:45 PM   Specimen: BLOOD LEFT ARM  Result Value Ref Range Status   Specimen Description BLOOD LEFT ARM  Final   Special Requests   Final    BOTTLES DRAWN AEROBIC AND ANAEROBIC Blood Culture adequate volume   Culture   Final    NO GROWTH 5 DAYS Performed at [facility], [address] [place], [place] [address]    Report Status 06/25/2020 FINAL  Final  Blood culture (routine x 2)     Status: None   Collection Time: 06/20/20  9:46 PM   Specimen: BLOOD RIGHT ARM  Result Value Ref Range Status   Specimen Description BLOOD RIGHT ARM  Final   Special Requests   Final    BOTTLES DRAWN AEROBIC AND ANAEROBIC Blood Culture adequate volume   Culture   Final    NO GROWTH 5 DAYS Performed at [facility], [address] [place], [place] [address]    Report Status 06/25/2020 FINAL  Final  Respiratory Panel by RT PCR (Flu A&B, Covid) -  Nasopharyngeal Swab     Status: None   Collection Time: 06/20/20 10:48 PM   Specimen: Nasopharyngeal Swab; Nasopharyngeal(NP) swabs in vial transport medium  Result Value Ref Range Status   SARS Coronavirus 2 by RT PCR NEGATIVE NEGATIVE Final    Comment: (NOTE) SARS-CoV-2 target nucleic  acids are NOT DETECTED.  The SARS-CoV-2 RNA is generally detectable in upper respiratoy specimens during the acute phase of infection. The lowest concentration of SARS-CoV-2 viral copies this assay can detect is 131 copies/mL. A negative result does not preclude SARS-Cov-2 infection and should not be used as the sole basis for treatment or other patient management decisions. A negative result may occur with  improper specimen collection/handling, submission of specimen other than nasopharyngeal swab, presence of viral mutation(s) within the areas targeted by this assay, and inadequate number of viral copies (<131 copies/mL). A negative result must be combined with clinical observations, patient history, and epidemiological information. The expected result is Negative.  Fact Sheet for Patients:  https://www.moore.com/  Fact Sheet for Healthcare Providers:  https://www.young.biz/  This test is no t yet approved or cleared by the [place] FDA and  has been authorized for detection and/or diagnosis of SARS-CoV-2 by FDA under an Emergency Use Authorization (EUA). This EUA will remain  in effect (meaning this test can be used) for the duration of the COVID-19 declaration under Section 564(b)(1) of the Act, 21 U.S.C. section 360bbb-3(b)(1), unless the authorization is terminated or revoked sooner.     Influenza A by PCR NEGATIVE NEGATIVE Final   Influenza B by PCR NEGATIVE NEGATIVE Final    Comment: (NOTE) The Xpert Xpress SARS-CoV-2/FLU/RSV assay is intended as an aid in  the diagnosis of influenza from Nasopharyngeal swab specimens and  should not be used  as a sole basis for treatment. Nasal washings and  aspirates are unacceptable for Xpert Xpress SARS-CoV-2/FLU/RSV  testing.  Fact Sheet for Patients: https://www.moore.com/  Fact Sheet for Healthcare Providers: https://www.young.biz/  This test is not yet approved or cleared by the [place] FDA and  has been authorized for detection and/or diagnosis of SARS-CoV-2 by  FDA under an Emergency Use Authorization (EUA). This EUA will remain  in effect (meaning this test can be used) for the duration of the  Covid-19 declaration under Section 564(b)(1) of the Act, 21  U.S.C. section 360bbb-3(b)(1), unless the authorization is  terminated or revoked. Performed at [facility], [address] [place], [place] [address]   Expectorated sputum assessment w rflx to resp cult     Status: None   Collection Time: 06/21/20  1:16 AM   Specimen: Expectorated Sputum  Result Value Ref Range Status   Specimen Description EXPECTORATED SPUTUM  Final   Special Requests NONE  Final   Sputum evaluation   Final    THIS SPECIMEN IS ACCEPTABLE FOR SPUTUM CULTURE Performed at [facility], [address], [place], [place] [address]    Report Status 06/21/2020 FINAL  Final  Culture, respiratory     Status: None   Collection Time: 06/21/20  1:16 AM  Result Value Ref Range Status   Specimen Description EXPECTORATED SPUTUM  Final   Special Requests NONE Reflexed from [id number]  Final   Gram Stain   Final    ABUNDANT WBC PRESENT, PREDOMINANTLY PMN RARE SQUAMOUS EPITHELIAL CELLS PRESENT MODERATE YEAST RARE GRAM POSITIVE COCCI RARE GRAM NEGATIVE RODS    Culture   Final    Normal respiratory flora-no Staph aureus or Pseudomonas seen Performed at [facility], [address], [place], [place] [address]    Report Status 06/23/2020 FINAL  Final  MTB RIF NAA w/o Culture, Sputum     Status: None   Collection Time: 06/21/20  3:55 AM   Specimen:  Expectorated Sputum  Result Value Ref Range Status   M Tuberculosis Complex Comment NOT DETECTED Final    Comment: (NOTE) Mycobacterium tuberculosis complex (MTBC) NOT detected. Per the College of American Pathologists (CAP) guidelines, a culture must be performed on all samples regardless of the molecular test result. By ordering this test code, the client has assumed responsibility for the performance of the culture.    Rifampin Comment Susceptible Final    Comment: (NOTE) Because Mycobacterium tuberculosis complex (MTBC) was not detected, no rifampin determination is possible.    AFB Specimen Processing Concentration  Final    Comment: (NOTE) Performed At: [facility] [address] [place], [place] [id number] Jolene Schimke MD WU:[id number]   Acid Fast Smear (AFB)     Status: None   Collection Time: 06/22/20  8:17 AM   Specimen: Sputum  Result Value Ref Range Status   AFB Specimen Processing Concentration  Final   Acid Fast Smear Negative  Final    Comment: (NOTE) Performed At: [facility] [address] [place], [place] [id number] Jolene Schimke MD VH:[id number]    Source (AFB) SPU  Final    Comment: Performed at [facility], [address] [place], [place] [address]  Body fluid culture     Status: None (Preliminary result)   Collection Time: 06/22/20 11:23 AM   Specimen: PATH Cytology Pleural fluid  Result Value Ref Range Status   Specimen Description PLEURAL FLUID  Final   Special Requests RIGHT  Final   Gram Stain   Final    ABUNDANT WBC PRESENT, PREDOMINANTLY PMN NO ORGANISMS SEEN    Culture   Final    NO GROWTH 3 DAYS Performed at [facility], [address] [place], [place] [address]    Report Status PENDING  Incomplete  Acid Fast Smear (AFB)     Status: None   Collection Time: 06/22/20  4:27 PM   Specimen: Pleural; Respiratory  Result Value Ref Range Status   AFB Specimen Processing Concentration  Final   Acid Fast  Smear Negative  Final    Comment: (NOTE) Performed At: [facility] [address] [place], [place] [id number] Jolene Schimke MD [id number]    Source (AFB) PLEURAL  Final    Comment: FLUID Performed at [facility], [address], [place], [place] [address]   Acid Fast Smear (AFB)     Status: None   Collection Time: 06/23/20  5:00 AM   Specimen: Sputum  Result Value Ref Range Status   AFB Specimen Processing Concentration  Final   Acid Fast Smear Negative  Final    Comment: (NOTE) Performed At: [facility] [address] [place], [place] [id number] Jolene Schimke MD SA:[id number]    Source (AFB) EXPECTORATED SPUTUM  Final    Comment: Performed at [facility], [address], [place], [place] [address]  Aerobic/Anaerobic Culture (surgical/deep wound)     Status: None (Preliminary result)   Collection Time: 06/24/20  5:23 PM   Specimen: Pleural Fluid  Result Value Ref Range Status   Specimen Description PLEURAL  Final   Special Requests NONE  Final   Gram Stain   Final    ABUNDANT WBC PRESENT, PREDOMINANTLY PMN NO ORGANISMS SEEN    Culture   Final    NO GROWTH < 12 HOURS Performed at [facility], [address] [place], [place] [address]    Report Status PENDING  Incomplete    Assessment/Plan:  1 afeb,  VSS 2 sats good on RA 3 110 cc drainage per tube, will see how thrombolytics work, hoping to avoid surgery if possible. Tube currently clamped 4 no leukocytosis 5 CXR stable in appearance- conts current abx  LOS: 4 days    Rowe Clack PA-C Pager [phone] 06/25/2020  Patient to have angiogram soon for lower extremity ischemic disease 110 drained from tube- now clamped with thrombolytic placed this am I have seen and examined Kirk Carrillo and agree with the above assessment  and plan.  Delight Ovens MD Beeper [phone] Office [phone] 06/25/2020 9:25 AM

## 2020-06-25 NOTE — Evaluation (Signed)
Physical Therapy Evaluation Patient Details Name: Kirk Carrillo MRN: [id number] DOB: [birth year] Today's Date: 06/25/2020   History of Present Illness  Kirk Carrillo is a 59 year old male with past medical history significant for or essential hypertension, tobacco use disorder, alcohol use without abuse who presented to the ED with right leg pain. X-ray right foot showed no evidence of osteomyelitis. Chest x-ray revealing multiple right sided pulmonary infiltrates. CT chest with contrast with multiple cavitary lesions through right lung with empyema. S/p chest tube placement 06/24/2020.   Clinical Impression  Prior to admission, pt lives with his spouse in a two level house and works as an Print production planner. Pt presents with decreased functional mobility secondary to right foot pain, mild dynamic balance deficits, decreased endurance. Pt ambulating 500 feet with no assistive device at a supervision level. HR peak 118 bpm, DOE 0/4. Will follow to promote mobility and to reassess pending potential vascular intervention 11/26.    Follow Up Recommendations No PT follow up    Equipment Recommendations  None recommended by PT    Recommendations for Other Services       Precautions / Restrictions Precautions Precautions: Other (comment) Precaution Comments: chest tube Restrictions Weight Bearing Restrictions: No      Mobility  Bed Mobility Overal bed mobility: Independent                  Transfers Overall transfer level: Independent Equipment used: None                Ambulation/Gait Ambulation/Gait assistance: Supervision Gait Distance (Feet): 500 Feet Assistive device: None Gait Pattern/deviations: Step-through pattern;Decreased stride length Gait velocity: decreased   General Gait Details: Mild dynamic instability, supervision for safety. Pt requiring one standing rest break  Stairs            Wheelchair Mobility    Modified Rankin  (Stroke Patients Only)       Balance Overall balance assessment: Mild deficits observed, not formally tested                                           Pertinent Vitals/Pain Pain Assessment: Faces Faces Pain Scale: Hurts little more Pain Location: R foot Pain Descriptors / Indicators: Tingling Pain Intervention(s): Monitored during session    Home Living Family/patient expects to be discharged to:: Private residence Living Arrangements: Spouse/significant other Available Help at Discharge: Family Type of Home: Apartment Home Access: Stairs to enter   Secretary/administrator of Steps:  (flight) Home Layout: Two level        Prior Function Level of Independence: Independent         Comments: Holiday representative Dominance        Extremity/Trunk Assessment   Upper Extremity Assessment Upper Extremity Assessment: Overall WFL for tasks assessed    Lower Extremity Assessment Lower Extremity Assessment: Overall WFL for tasks assessed    Cervical / Trunk Assessment Cervical / Trunk Assessment: Normal  Communication   Communication: No difficulties  Cognition Arousal/Alertness: Awake/alert Behavior During Therapy: WFL for tasks assessed/performed Overall Cognitive Status: Within Functional Limits for tasks assessed                                        General Comments  Exercises     Assessment/Plan    PT Assessment Patient needs continued PT services  PT Problem List Decreased activity tolerance;Decreased balance;Decreased mobility;Pain       PT Treatment Interventions Gait training;Stair training;Functional mobility training;Therapeutic activities;Therapeutic exercise;Balance training;Patient/family education    PT Goals (Current goals can be found in the Care Plan section)  Acute Rehab PT Goals Patient Stated Goal: go back to work PT Goal Formulation: With patient Time For Goal  Achievement: 07/09/20 Potential to Achieve Goals: Good    Frequency Min 3X/week   Barriers to discharge        Co-evaluation               AM-PAC PT "6 Clicks" Mobility  Outcome Measure Help needed turning from your back to your side while in a flat bed without using bedrails?: None Help needed moving from lying on your back to sitting on the side of a flat bed without using bedrails?: None Help needed moving to and from a bed to a chair (including a wheelchair)?: None Help needed standing up from a chair using your arms (e.g., wheelchair or bedside chair)?: None Help needed to walk in hospital room?: None Help needed climbing 3-5 steps with a railing? : A Little 6 Click Score: 23    End of Session   Activity Tolerance: Patient tolerated treatment well Patient left: in bed;with call bell/phone within reach Nurse Communication: Mobility status PT Visit Diagnosis: Unsteadiness on feet (R26.81);Pain Pain - Right/Left: Right Pain - part of body: Ankle and joints of foot    Time: 1400-1427 PT Time Calculation (min) (ACUTE ONLY): 27 min   Charges:   PT Evaluation $PT Eval Moderate Complexity: 1 Mod PT Treatments $Therapeutic Activity: 8-22 mins        Lillia Pauls, PT, DPT Acute Rehabilitation Services Pager [phone] Office [phone]   Norval Morton 06/25/2020, 2:48 PM

## 2020-06-25 NOTE — Progress Notes (Signed)
Referring Physician(s): Myer PeerGerhardt, Edward B (TCTS)  Supervising Physician: Oley BalmHassell, Daniel  [facility]  [facility]  Chief Complaint: Tenderness of right chest tube insertion site  Subjective:  Right loculated pleural effusion s/p right chest tube placement in IR 06/24/2020. Patient awake and alert sitting in bed. Just underwent thrombolytic indwell by CCM this AM. Complains of tenderness at tube insertion site, as expected. Right chest tube site c/d/i. On RA.  CXR this AM: 1. PleurX catheter with tip in right base region. No pneumothorax. Small loculated pleural effusion on the right. Areas of apparent scarring and atelectasis right mid and lower lung regions. Mild atelectasis medial left base. Scattered granulomas on the left. No edema or airspace opacity. Stable cardiac silhouette. Aortic Atherosclerosis (ICD10-I70.0).   Allergies: Amlodipine and Telmisartan  Medications: Prior to Admission medications   Medication Sig Start Date End Date Taking? Authorizing Provider  diphenhydrAMINE (BENADRYL) 25 mg capsule Take 25 mg by mouth in the morning, at noon, and at bedtime.   Yes [provider]  guaifenesin (ROBITUSSIN) 100 MG/5ML syrup Take 200 mg by mouth 2 times daily at 12 noon and 4 pm.   Yes [provider]  hydrochlorothiazide (HYDRODIURIL) 25 MG tablet Take 1 tablet (25 mg total) by mouth daily. 08/06/12  Yes Earley FavorSchulz, Gail, NP  ibuprofen (ADVIL) 200 MG tablet Take 400 mg by mouth every 6 (six) hours as needed for moderate pain.   Yes [provider]  albuterol (PROAIR HFA) 108 (90 Base) MCG/ACT inhaler Inhale 1-2 puffs into the lungs every 6 (six) hours as needed for wheezing or shortness of breath. Patient not taking: Reported on 06/21/2020 02/15/20   Georgetta HaberBurky, Natalie B, NP  benzonatate (TESSALON) 100 MG capsule Take 1-2 capsules (100-200 mg total) by mouth 3 (three) times daily as needed for cough. Patient not taking: Reported on 06/21/2020  02/15/20   Georgetta HaberBurky, Natalie B, NP     Vital Signs: BP 120/71 (BP Location: Right Arm)   Pulse 82   Temp 98.6 F (37 C) (Oral)   Resp 18   Ht 5' 7.5" (1.715 m)   Wt 124 lb 12.8 oz (56.6 kg)   SpO2 99%   BMI 19.26 kg/m   Physical Exam Vitals and nursing note reviewed.  Constitutional:      General: He is not in acute distress.    Appearance: Normal appearance.  Pulmonary:     Effort: Pulmonary effort is normal. No respiratory distress.     Comments: On RA. Right chest tube site with mild tenderness, no erythema, drainage, or active bleeding noted; approximately 380 cc bloody fluid in pleure-vac, tube to suction with (+) air leak. Skin:    General: Skin is warm and dry.  Neurological:     Mental Status: He is alert and oriented to person, place, and time.     Imaging: DG Chest Port 1 View  Result Date: 06/25/2020 CLINICAL DATA:  PleurX catheter placement EXAM: PORTABLE CHEST 1 VIEW COMPARISON:  June 22, 2020 FINDINGS: PleurX catheter tip is in the right lower lung region. No pneumothorax. Small loculated pleural effusion noted on the right. There is ill-defined airspace opacity in the right mid and lower lung zones, primarily due to scarring and atelectatic change. There is slight atelectasis in the medial left base. There is a small granuloma in the left upper lobe. Left lung otherwise clear. Heart size and pulmonary vascular normal. No adenopathy. There is aortic atherosclerosis. There are foci of calcification in each  carotid artery. No bone lesions. IMPRESSION: PleurX catheter with tip in right base region. No pneumothorax. Small loculated pleural effusion on the right. Areas of apparent scarring and atelectasis right mid and lower lung regions. Mild atelectasis medial left base. Scattered granulomas on the left. No edema or airspace opacity. Stable cardiac silhouette. Aortic Atherosclerosis (ICD10-I70.0). Electronically Signed   By: Bretta Bang III M.D.   On: 06/25/2020  08:19   DG Chest Port 1 View  Result Date: 06/22/2020 CLINICAL DATA:  Post thoracentesis. EXAM: PORTABLE CHEST 1 VIEW COMPARISON:  06/20/2020 FINDINGS: Lungs are adequately inflated with heterogeneous density over the right mid to lower lung with slight interval improved aeration over the medial right base likely due to improved known loculated pleural effusion post thoracentesis. No pneumothorax. Left lung is clear. Remainder the exam is unchanged. IMPRESSION: 1. Improved aeration over the medial right base likely improved loculated pleural effusion post thoracentesis. No pneumothorax. 2. Persistent heterogeneous density over the right mid to lower lung. Electronically Signed   By: Elberta Fortis M.D.   On: 06/22/2020 12:16   VAS [place] ABI WITH/WO TBI  Result Date: 06/23/2020 LOWER EXTREMITY DOPPLER STUDY Indications: Claudication, ulceration, and peripheral artery disease. High Risk Factors: Current smoker. Other Factors: Has known PAD, prior workup at the [place].  Comparison Study: No prior study on file Performing Technologist: Sherren Kerns RVS  Examination Guidelines: A complete evaluation includes at minimum, Doppler waveform signals and systolic blood pressure reading at the level of bilateral brachial, anterior tibial, and posterior tibial arteries, when vessel segments are accessible. Bilateral testing is considered an integral part of a complete examination. Photoelectric Plethysmograph (PPG) waveforms and toe systolic pressure readings are included as required and additional duplex testing as needed. Limited examinations for reoccurring indications may be performed as noted.  ABI Findings: +---------+------------------+-----+----------+--------+ Right    Rt Pressure (mmHg)IndexWaveform  Comment  +---------+------------------+-----+----------+--------+ Brachial 129                    triphasic          +---------+------------------+-----+----------+--------+ PTA      53                 0.41 monophasic         +---------+------------------+-----+----------+--------+ DP       69                0.53 monophasic         +---------+------------------+-----+----------+--------+ Great Toe14                0.11                    +---------+------------------+-----+----------+--------+ +---------+------------------+-----+----------+-------+ Left     Lt Pressure (mmHg)IndexWaveform  Comment +---------+------------------+-----+----------+-------+ Brachial 120                    triphasic         +---------+------------------+-----+----------+-------+ PTA      76                0.59 monophasic        +---------+------------------+-----+----------+-------+ PERO     77                0.60 monophasic        +---------+------------------+-----+----------+-------+ Great Toe87                0.67                   +---------+------------------+-----+----------+-------+ +-------+-----------+-----------+------------+------------+  ABI/TBIToday's ABIToday's TBIPrevious ABIPrevious TBI +-------+-----------+-----------+------------+------------+ Right  0.53       0.11                                +-------+-----------+-----------+------------+------------+ Left   0.60       0.67                                +-------+-----------+-----------+------------+------------+  Summary: Right: Resting right ankle-brachial index indicates moderate right lower extremity arterial disease. The right toe-brachial index is abnormal. Left: Resting left ankle-brachial index indicates moderate left lower extremity arterial disease. The left toe-brachial index is abnormal.  *See table(s) above for measurements and observations.  Electronically signed by Sherald Hess MD on 06/23/2020 at 4:41:57 PM.    Final    CT PERC PLEURAL DRAIN W/INDWELL CATH W/IMG GUIDE  Result Date: 06/24/2020 INDICATION: 59 year old with a loculated right pleural effusion and only a small  amount of fluid could be removed during thoracentesis. Request for a large-bore chest tube. Patient also has cavitary lesions in the right lung. EXAM: CT-GUIDED RIGHT CHEST TUBE PLACEMENT MEDICATIONS: Moderate sedation ANESTHESIA/SEDATION: Fentanyl 50 mcg IV; Versed 1.5 mg IV Moderate Sedation Time:  23 minutes The patient was continuously monitored during the procedure by the interventional radiology nurse under my direct supervision. COMPLICATIONS: None immediate. PROCEDURE: Informed written consent was obtained from the patient after a thorough discussion of the procedural risks, benefits and alternatives. All questions were addressed. Maximal Sterile Barrier Technique was utilized including caps, mask, sterile gowns, sterile gloves, sterile drape, hand hygiene and skin antiseptic. A timeout was performed prior to the initiation of the procedure. Patient was placed prone. CT images through the chest were obtained. The loculated posterior right pleural effusion was identified and targeted. The right side of the back was prepped with chlorhexidine and sterile field was created. Skin was anesthetized using 1% lidocaine. Using CT guidance, a Yueh catheter was directed into the loculated effusion and a small amount of yellow fluid was aspirated. A J wire was advanced into the pleural space and the tract was dilated to accommodate a 16 [place] Thal- Quick drain. Drain was advanced over the wire. Approximately 20 mL of cloudy grayish colored purulent fluid was aspirated. Drain was sutured in place and attached to a chest drainage system. Minimal fluid was draining after placement to the suction. FINDINGS: Loculated posterior right pleural effusion. 16 French drain was successfully placed within the loculated effusion. Approximately 20 mL of purulent looking fluid was removed and findings are suggestive for an empyema. IMPRESSION: CT-guided placement of a chest tube within the right chest empyema. Fluid was sent for  culture. Electronically Signed   By: Richarda Overlie M.D.   On: 06/24/2020 17:22   US THORACENTESIS ASP PLEURAL SPACE W/IMG GUIDE  Result Date: 06/22/2020 INDICATION: Patient with history of sepsis secondary to pneumonia, dyspnea, and loculated right pleural effusion. Request is made for diagnostic and therapeutic right thoracentesis. EXAM: ULTRASOUND GUIDED DIAGNOSTIC AND THERAPEUTIC RIGHT THORACENTESIS MEDICATIONS: 10 mL 1% lidocaine COMPLICATIONS: None immediate. PROCEDURE: An ultrasound guided thoracentesis was thoroughly discussed with the patient and questions answered. The benefits, risks, alternatives and complications were also discussed. The patient understands and wishes to proceed with the procedure. Written consent was obtained. Ultrasound was performed to localize and mark an adequate pocket of loculated fluid in the right chest. The area was  then prepped and draped in the normal sterile fashion. 1% Lidocaine was used for local anesthesia. Under ultrasound guidance a 6 Fr Safe-T-Centesis catheter was introduced. Thoracentesis was performed. The catheter was removed and a dressing applied. FINDINGS: A total of approximately 15 mL of clear yellow fluid was removed. Samples were sent to the laboratory as requested by the clinical team. Right pleural fluid is very loculated and only a small amount of fluid could be obtained. IMPRESSION: Successful ultrasound guided right thoracentesis yielding 15 mL of pleural fluid. Right pleural effusion is very loculated. Read by: Elwin Mocha, PA-C Electronically Signed   By: Richarda Overlie M.D.   On: 06/22/2020 11:47    Labs:  CBC: Recent Labs    06/22/20 0444 06/23/20 0649 06/24/20 0347 06/25/20 0340  WBC 13.6* 12.5* 11.3* 9.2  HGB 8.1* 8.4* 7.9* 8.3*  HCT 26.0* 27.0* 25.4* 27.2*  PLT 433* 494* 515* 548*    COAGS: Recent Labs    06/21/20 0500 06/24/20 0347  INR 1.1 1.1    BMP: Recent Labs    06/21/20 0500 06/22/20 0444 06/23/20 0649  06/25/20 0340  NA 132* 129* 132* 131*  K 3.1* 3.2* 4.1 4.3  CL 97* 97* 100 98  CO2 25 25 25 25   GLUCOSE 91 109* 96 99  BUN 8 5* 6* 6*  CALCIUM 8.1* 7.9* 8.1* 8.5*  CREATININE 0.50* 0.38* 0.37* 0.37*  GFRNONAA >60 >60 >60 >60    LIVER FUNCTION TESTS: Recent Labs    06/20/20 2134 06/21/20 0500 06/22/20 0444  BILITOT 1.4* 0.7 0.2*  AST 22 22 23   ALT 20 18 23   ALKPHOS 86 67 63  PROT 7.5 6.1* 5.9*  ALBUMIN 2.3* 1.8* 1.6*    Assessment and Plan:  Right loculated pleural effusion s/p right chest tube placement in IR 06/24/2020. Right chest tube stable with approximately 380 cc bloody fluid in pleure-vac, tube to suction with (+) air leak. Continue current chest tube management- continue with Qshift monitor of output, tube to be to suction, monitor with routine imaging scans to assess fluid collection. Further plans per TRH/CCM/TCTS/vascular surgery- appreciate and agree with management. IR to follow.   Electronically Signed: , PA-C 06/25/2020, 11:07 AM   I spent a total of 25 Minutes at the the patient's bedside AND on the patient's hospital floor or unit, greater than 50% of which was counseling/coordinating care for right loculated pleural effusion s/p right chest tube placement.

## 2020-06-25 NOTE — Plan of Care (Signed)
Patient progressing well on all care plan items.

## 2020-06-25 NOTE — Procedures (Signed)
Pleural Fibrinolytic Administration Procedure Note  Kirk Carrillo  [id number]  16-Jan-1961  Date:06/25/20  Time:11:29 AM   Provider Performing:Choice Kleinsasser R Wilburt Messina   Procedure: Pleural Fibrinolysis Initial day (80165)  Indication(s) Fibrinolysis of complicated pleural effusion  Consent Risks of the procedure as well as the alternatives and risks of each were explained to the patient and/or caregiver.  Consent for the procedure was obtained.   Anesthesia None   Time Out Verified patient identification, verified procedure, site/side was marked, verified correct patient position, special equipment/implants available, medications/allergies/relevant history reviewed, required imaging and test results available.   Sterile Technique Hand hygiene, gloves   Procedure Description Existing pleural catheter was cleaned and accessed in sterile manner.  10mg  of tPA in 30cc of saline and 5mg  of dornase in 30cc of sterile water followed by 20 cc sterile saline were injected into pleural space using existing pleural catheter.  Catheter will be clamped for 2 hours and then placed back to suction.   Complications/Tolerance None; patient tolerated the procedure well.  EBL None   Specimen(s) None

## 2020-06-25 NOTE — Progress Notes (Signed)
PROGRESS NOTE    Kirk Carrillo  MWN:[id number] DOB: 09-05-1960 DOA: 06/20/2020 PCP: Pcp, No    Brief Narrative:  Kirk Carrillo is a 59 year old male with past medical history significant for or essential hypertension, tobacco use disorder, alcohol use without abuse who presented to the ED with right leg pain.  Pain has progressively gotten worse over the past several days and worse with weightbearing.  Patient has noticed that he has been developing ulcerations between the fourth and fifth toes.  Due to his symptoms, he presented to [facility] health urgent care clinic.  He was initially prescribed a 7-day course of doxycycline and had a podiatry referral placed; but after this was accomplished the urgent care provider recommend patient come to the emergency department for further evaluation.  Additionally, patient complains of worsening cough for the past 6 months.  Associated with foul-smelling sputum.  Also reports a 20 pound unintentional weight loss.  Denies any international travel, no close contact with anyone diagnosed with tuberculosis, no history of IV drug abuse or incarceration.  Denies fevers.  In the ED, x-ray right foot which revealed no evidence of osteomyelitis.  Elevated WBC count with associated tachycardia concerning for sepsis.  Chest x-ray revealing multiple right-sided pulmonary infiltrates.  CT chest with contrast with multiple cavitary lesions throughout the right lung with possible concurrent empyema.  Patient was started on empiric broad-spectrum antibiotics.  Hospitalist service was requested for further evaluation and management.   Assessment & Plan:   Principal Problem:   Sepsis due to pneumonia [facility]) Active Problems:   Pneumonia of right lung due to infectious organism   Essential hypertension   Empyema of right pleural space (HCC)   Alcohol abuse   Ulcer of toe, right, with fat layer exposed (HCC)   Nicotine dependence, cigarettes,  uncomplicated   Multiple right-sided pulmonary cavitations Right loculated pleural effusion/empyema Patient reports chronic cough productive of foul-smelling sputum past 6 months.  No recent travel, no IV drug use/incarceration, no contact with individuals diagnosed with TB.  CT chest with contrast notable for multiple areas of cavitation throughout the right lung, predominantly right upper lobe with several areas of the cavitation thick-walled and peripheral enhancing concerning for abscess.  Underwent IR guided right thoracentesis with 15 mL of clear yellow fluid obtained.  Underwent IR CT-guided chest tube placement on 06/24/2020. S/p TPA/dornase per PCCM to chest tybe 11/24. --PCCM/CTS/IR following, appreciate assistance --QuantiFERON gold: Negative --MTB NAA antigen negative --AFB x 2 negative, 3rd specimen: Pending --Pleural fluid culture 06/22/2020: No growth x3 days --Pleural fluid culture 06/24/2020: No growth <24h --Flush chest tube twice daily with 50 cc normal saline --Airborne precautions discontinued per pulmonology today --Continue empiric antibiotics with vancomycin/meropenem, if cultures show no growth, pulmonology likely to transition to Unasyn followed by Augmentin to complete a 6-week antibiotic course  Peripheral vascular disease Right foot toe ulcers Patient presenting to the ED with right foot pain with new ulcers to fourth/fifth toe.  Right foot x-ray with no acute bony abnormality.  Vascular ultrasound ABI with moderate right and left lower extremity arterial disease --Vascular surgery plans arteriogram with possible intervention on 06/27/2020  Hypokalemia Hypomagnesemia Repleted earlier in the hospitalization.  Potassium 4.3 today. --Continue monitor electrolytes closely  Hyponatremia Etiology likely secondary to poor oral intake. --Sodium 131 today. --Continue to encourage increased oral intake, hydration.  Iron deficiency anemia Iron level 10, TIBC 221  (low), folate 15.8, B12 398. --Avoiding IV iron secondary to active infection/empyema --Ferrous sulfate 325 mg  p.o. 3 times daily  B12 deficiency B12 398. --Cyanocobalamin 1000 mcg p.o. daily  Tobacco use disorder --Nicotine patch  Moderate protein calorie malnutrition Body mass index is 19.26 kg/m. Nutrition Status: Nutrition Problem: Increased nutrient needs Etiology: acute illness (rt pleural effusion) Signs/Symptoms: estimated needs Interventions: Ensure Enlive (each supplement provides 350kcal and 20 grams of protein), MVI --Will discontinue Ensure today as patient reporting diarrhea with use --Continue to encourage increased oral intake    DVT prophylaxis: SCDs Code Status: Full code Family Communication: No family present at bedside this morning  Disposition Plan:  Status is: Inpatient  Remains inpatient appropriate because:Ongoing active pain requiring inpatient pain management, Ongoing diagnostic testing needed not appropriate for outpatient work up, Unsafe d/c plan, IV treatments appropriate due to intensity of illness or inability to take PO and Inpatient level of care appropriate due to severity of illness   Dispo: The patient is from: Home              Anticipated d/c is to: Home              Anticipated d/c date is: > 3 days              Patient currently is not medically stable to d/c.   Consultants:   PCCM  Vascular surgery  Interventional radiology  Procedures:   Right thoracentesis, IR 06/22/2020  Right chest tube, IR 06/24/2020  Antimicrobials:   Vancomycin 11/19>>  Meropenem 11/20>>  Zosyn 11/19 - 11/20   Subjective: Patient seen and examined bedside, resting comfortably sitting at edge of bed.  PCCM and CTS PA present.  Patient receiving TPA/dornase to chest tube today to assist with fluid/empyema mobilization.  Pain controlled.  No other questions or concerns at this time.  Denies headache, no visual changes, no chest pain, no  palpitations, no abdominal pain, no weakness, no fatigue, no fever/chills/night sweats, no nausea/vomiting, no paresthesias.  No acute concerns overnight per nursing staff.  Objective: Vitals:   06/24/20 1535 06/24/20 1550 06/24/20 1954 06/25/20 0400  BP: (!) 126/93 115/64 108/73 120/71  Pulse: 95 94 82   Resp: (!) Temp:   98.1 F (36.7 C) 98.6 F (37 C)  TempSrc:   Oral Oral  SpO2: 100% 97% 97% 99%  Weight:    56.6 kg  Height:        Intake/Output Summary (Last 24 hours) at 06/25/2020 1248 Last data filed at 06/25/2020 1000 Gross per 24 hour  Intake 1628.68 ml  Output 2252 ml  Net -623.32 ml   Filed Weights   06/23/20 0422 06/24/20 0500 06/25/20 0400  Weight: 55.8 kg 55.5 kg 56.6 kg    Examination:  General exam: Appears calm and comfortable  Respiratory system: Clear to auscultation. Respiratory effort normal.  Chest tube noted in place. Cardiovascular system: S1 & S2 heard, RRR. No JVD, murmurs, rubs, gallops or clicks. No pedal edema. Gastrointestinal system: Abdomen is nondistended, soft and nontender. No organomegaly or masses felt. Normal bowel sounds heard. Central nervous system: Alert and oriented. No focal neurological deficits. Extremities: Symmetric 5 x 5 power. Skin: Right foot with dressing in place Psychiatry: Judgement and insight appear normal. Mood & affect appropriate.     Data Reviewed: I have personally reviewed following labs and imaging studies  CBC: Recent Labs  Lab 06/20/20 2134 06/20/20 2134 06/21/20 0500 06/22/20 0444 06/23/20 0649 06/24/20 0347 06/25/20 0340  WBC 19.1*   < > 13.2* 13.6* 12.5* 11.3* 9.2  NEUTROABS 15.1*  --  10.1* 10.5*  --   --   --   HGB 9.6*   < > 8.7* 8.1* 8.4* 7.9* 8.3*  HCT 30.7*   < > 28.3* 26.0* 27.0* 25.4* 27.2*  MCV 79.5*   < > 78.6* 77.6* 78.0* 77.9* 78.8*  PLT 673*   < > 537* 433* 494* 515* 548*   < > = values in this interval not displayed.   Basic Metabolic Panel: Recent Labs  Lab  06/20/20 2134 06/21/20 0500 06/22/20 0444 06/23/20 0649 06/25/20 0340  NA 129* 132* 129* 132* 131*  K 3.6 3.1* 3.2* 4.1 4.3  CL 89* 97* 97* 100 98  CO2 GLUCOSE 88 91 109* 96 99  BUN 11 8 5* 6* 6*  CREATININE 0.69 0.50* 0.38* 0.37* 0.37*  CALCIUM 8.9 8.1* 7.9* 8.1* 8.5*  MG  --  1.6* 1.7  --  1.9  PHOS  --  3.9  --   --   --    GFR: Estimated Creatinine Clearance: 77.6 mL/min (A) (by C-G formula based on SCr of 0.37 mg/dL (L)). Liver Function Tests: Recent Labs  Lab 06/20/20 2134 06/21/20 0500 06/22/20 0444  AST ALT ALKPHOS 86 67 63  BILITOT 1.4* 0.7 0.2*  PROT 7.5 6.1* 5.9*  ALBUMIN 2.3* 1.8* 1.6*   Recent Labs  Lab 06/20/20 2134  LIPASE 19   No results for input(s): AMMONIA in the last 168 hours. Coagulation Profile: Recent Labs  Lab 06/21/20 0500 06/24/20 0347  INR 1.1 1.1   Cardiac Enzymes: No results for input(s): CKTOTAL, CKMB, CKMBINDEX, TROPONINI in the last 168 hours. BNP (last 3 results) No results for input(s): PROBNP in the last 8760 hours. HbA1C: No results for input(s): HGBA1C in the last 72 hours. CBG: No results for input(s): GLUCAP in the last 168 hours. Lipid Profile: No results for input(s): CHOL, HDL, LDLCALC, TRIG, CHOLHDL, LDLDIRECT in the last 72 hours. Thyroid Function Tests: No results for input(s): TSH, T4TOTAL, FREET4, T3FREE, THYROIDAB in the last 72 hours. Anemia Panel: No results for input(s): VITAMINB12, FOLATE, FERRITIN, TIBC, IRON, RETICCTPCT in the last 72 hours. Sepsis Labs: Recent Labs  Lab 06/20/20 2147 06/20/20 2319 06/21/20 0500  PROCALCITON  --   --  0.15  LATICACIDVEN 1.0 0.8  --     Recent Results (from the past 240 hour(s))  Blood culture (routine x 2)     Status: None   Collection Time: 06/20/20  9:45 PM   Specimen: BLOOD LEFT ARM  Result Value Ref Range Status   Specimen Description BLOOD LEFT ARM  Final   Special Requests   Final    BOTTLES DRAWN AEROBIC AND  ANAEROBIC Blood Culture adequate volume   Culture   Final    NO GROWTH 5 DAYS Performed at [facility], [address], [place], [place] [address]    Report Status 06/25/2020 FINAL  Final  Blood culture (routine x 2)     Status: None   Collection Time: 06/20/20  9:46 PM   Specimen: BLOOD RIGHT ARM  Result Value Ref Range Status   Specimen Description BLOOD RIGHT ARM  Final   Special Requests   Final    BOTTLES DRAWN AEROBIC AND ANAEROBIC Blood Culture adequate volume   Culture   Final    NO GROWTH 5 DAYS Performed at [facility], [address] [place], [place] [address]  Report Status 06/25/2020 FINAL  Final  Respiratory Panel by RT PCR (Flu A&B, Covid) - Nasopharyngeal Swab     Status: None   Collection Time: 06/20/20 10:48 PM   Specimen: Nasopharyngeal Swab; Nasopharyngeal(NP) swabs in vial transport medium  Result Value Ref Range Status   SARS Coronavirus 2 by RT PCR NEGATIVE NEGATIVE Final    Comment: (NOTE) SARS-CoV-2 target nucleic acids are NOT DETECTED.  The SARS-CoV-2 RNA is generally detectable in upper respiratoy specimens during the acute phase of infection. The lowest concentration of SARS-CoV-2 viral copies this assay can detect is 131 copies/mL. A negative result does not preclude SARS-Cov-2 infection and should not be used as the sole basis for treatment or other patient management decisions. A negative result may occur with  improper specimen collection/handling, submission of specimen other than nasopharyngeal swab, presence of viral mutation(s) within the areas targeted by this assay, and inadequate number of viral copies (<131 copies/mL). A negative result must be combined with clinical observations, patient history, and epidemiological information. The expected result is Negative.  Fact Sheet for Patients:  https://www.moore.com/  Fact Sheet for Healthcare Providers:  https://www.young.biz/  This  test is no t yet approved or cleared by the [place] FDA and  has been authorized for detection and/or diagnosis of SARS-CoV-2 by FDA under an Emergency Use Authorization (EUA). This EUA will remain  in effect (meaning this test can be used) for the duration of the COVID-19 declaration under Section 564(b)(1) of the Act, 21 U.S.C. section 360bbb-3(b)(1), unless the authorization is terminated or revoked sooner.     Influenza A by PCR NEGATIVE NEGATIVE Final   Influenza B by PCR NEGATIVE NEGATIVE Final    Comment: (NOTE) The Xpert Xpress SARS-CoV-2/FLU/RSV assay is intended as an aid in  the diagnosis of influenza from Nasopharyngeal swab specimens and  should not be used as a sole basis for treatment. Nasal washings and  aspirates are unacceptable for Xpert Xpress SARS-CoV-2/FLU/RSV  testing.  Fact Sheet for Patients: https://www.moore.com/  Fact Sheet for Healthcare Providers: https://www.young.biz/  This test is not yet approved or cleared by the [place] FDA and  has been authorized for detection and/or diagnosis of SARS-CoV-2 by  FDA under an Emergency Use Authorization (EUA). This EUA will remain  in effect (meaning this test can be used) for the duration of the  Covid-19 declaration under Section 564(b)(1) of the Act, 21  U.S.C. section 360bbb-3(b)(1), unless the authorization is  terminated or revoked. Performed at [facility], [address], [place], [place] [address]   Expectorated sputum assessment w rflx to resp cult     Status: None   Collection Time: 06/21/20  1:16 AM   Specimen: Expectorated Sputum  Result Value Ref Range Status   Specimen Description EXPECTORATED SPUTUM  Final   Special Requests NONE  Final   Sputum evaluation   Final    THIS SPECIMEN IS ACCEPTABLE FOR SPUTUM CULTURE Performed at [facility], [address] [place], [place] [address]    Report Status 06/21/2020 FINAL  Final   Culture, respiratory     Status: None   Collection Time: 06/21/20  1:16 AM  Result Value Ref Range Status   Specimen Description EXPECTORATED SPUTUM  Final   Special Requests NONE Reflexed from [id number]  Final   Gram Stain   Final    ABUNDANT WBC PRESENT, PREDOMINANTLY PMN RARE SQUAMOUS EPITHELIAL CELLS PRESENT MODERATE YEAST RARE GRAM POSITIVE COCCI RARE GRAM NEGATIVE RODS  Culture   Final    Normal respiratory flora-no Staph aureus or Pseudomonas seen Performed at [facility], [address] [place], [place] [address]    Report Status 06/23/2020 FINAL  Final  MTB RIF NAA w/o Culture, Sputum     Status: None   Collection Time: 06/21/20  3:55 AM   Specimen: Expectorated Sputum  Result Value Ref Range Status   M Tuberculosis Complex Comment NOT DETECTED Final    Comment: (NOTE) Mycobacterium tuberculosis complex (MTBC) NOT detected. Per the College of American Pathologists (CAP) guidelines, a culture must be performed on all samples regardless of the molecular test result. By ordering this test code, the client has assumed responsibility for the performance of the culture.    Rifampin Comment Susceptible Final    Comment: (NOTE) Because Mycobacterium tuberculosis complex (MTBC) was not detected, no rifampin determination is possible.    AFB Specimen Processing Concentration  Final    Comment: (NOTE) Performed At: [facility] [address] [place], [place] [id number] Jolene Schimke MD [id number]   Acid Fast Smear (AFB)     Status: None   Collection Time: 06/22/20  8:17 AM   Specimen: Sputum  Result Value Ref Range Status   AFB Specimen Processing Concentration  Final   Acid Fast Smear Negative  Final    Comment: (NOTE) Performed At: [facility] [address] [place], [place] [id number] Jolene Schimke MD ZJ:[id number]    Source (AFB) SPU  Final    Comment: Performed at [facility], [address] [place], [place] [address]   Body fluid culture     Status: None   Collection Time: 06/22/20 11:23 AM   Specimen: PATH Cytology Pleural fluid  Result Value Ref Range Status   Specimen Description PLEURAL FLUID  Final   Special Requests RIGHT  Final   Gram Stain   Final    ABUNDANT WBC PRESENT, PREDOMINANTLY PMN NO ORGANISMS SEEN    Culture   Final    NO GROWTH 3 DAYS Performed at [facility], [address], [place], [place] [address]    Report Status 06/25/2020 FINAL  Final  Acid Fast Smear (AFB)     Status: None   Collection Time: 06/22/20  4:27 PM   Specimen: Pleural; Respiratory  Result Value Ref Range Status   AFB Specimen Processing Concentration  Final   Acid Fast Smear Negative  Final    Comment: (NOTE) Performed At: [facility] [address] [place], [place] [id number] Jolene Schimke MD [id number]    Source (AFB) PLEURAL  Final    Comment: FLUID Performed at [facility], [address], [place], [place] [address]   Acid Fast Smear (AFB)     Status: None   Collection Time: 06/23/20  5:00 AM   Specimen: Sputum  Result Value Ref Range Status   AFB Specimen Processing Concentration  Final   Acid Fast Smear Negative  Final    Comment: (NOTE) Performed At: [facility] [address] [place], [place] [id number] Jolene Schimke MD PY:[id number]    Source (AFB) EXPECTORATED SPUTUM  Final    Comment: Performed at [facility], [address] [place], [place] [address]  Aerobic/Anaerobic Culture (surgical/deep wound)     Status: None (Preliminary result)   Collection Time: 06/24/20  5:23 PM   Specimen: Pleural Fluid  Result Value Ref Range Status   Specimen Description PLEURAL  Final   Special Requests NONE  Final   Gram Stain   Final    ABUNDANT WBC PRESENT, PREDOMINANTLY PMN NO ORGANISMS SEEN    Culture   Final    NO GROWTH < 12 HOURS Performed at [facility], [address] [place], [place] [id number]    Report Status PENDING  Incomplete          Radiology Studies: DG Chest Port 1 View  Result Date: 06/25/2020 CLINICAL DATA:  PleurX catheter placement EXAM: PORTABLE CHEST 1 VIEW COMPARISON:  June 22, 2020 FINDINGS: PleurX catheter tip is in the right lower lung region. No pneumothorax. Small loculated pleural effusion noted on the right. There is ill-defined airspace opacity in the right mid and lower lung zones, primarily due to scarring and atelectatic change. There is slight atelectasis in the medial left base. There is a small granuloma in the left upper lobe. Left lung otherwise clear. Heart size and pulmonary vascular normal. No adenopathy. There is aortic atherosclerosis. There are foci of calcification in each carotid artery. No bone lesions. IMPRESSION: PleurX catheter with tip in right base region. No pneumothorax. Small loculated pleural effusion on the right. Areas of apparent scarring and atelectasis right mid and lower lung regions. Mild atelectasis medial left base. Scattered granulomas on the left. No edema or airspace opacity. Stable cardiac silhouette. Aortic Atherosclerosis (ICD10-I70.0). Electronically Signed   By: Bretta BangWilliam  Woodruff III M.D.   On: 06/25/2020 08:19   [facility]/INDWELL CATH W/IMG GUIDE  Result Date: 06/24/2020 INDICATION: 59 year old with a loculated right pleural effusion and only a small amount of fluid could be removed during thoracentesis. Request for a large-bore chest tube. Patient also has cavitary lesions in the right lung. EXAM: CT-GUIDED RIGHT CHEST TUBE PLACEMENT MEDICATIONS: Moderate sedation ANESTHESIA/SEDATION: Fentanyl 50 mcg IV; Versed 1.5 mg IV Moderate Sedation Time:  23 minutes The patient was continuously monitored during the procedure by the interventional radiology nurse under my direct supervision. COMPLICATIONS: None immediate. PROCEDURE: Informed written consent was obtained from the patient after a thorough discussion of the procedural risks, benefits and  alternatives. All questions were addressed. Maximal Sterile Barrier Technique was utilized including caps, mask, sterile gowns, sterile gloves, sterile drape, hand hygiene and skin antiseptic. A timeout was performed prior to the initiation of the procedure. Patient was placed prone. CT images through the chest were obtained. The loculated posterior right pleural effusion was identified and targeted. The right side of the back was prepped with chlorhexidine and sterile field was created. Skin was anesthetized using 1% lidocaine. Using CT guidance, a Yueh catheter was directed into the loculated effusion and a small amount of yellow fluid was aspirated. A J wire was advanced into the pleural space and the tract was dilated to accommodate a 16 [place] Thal- Quick drain. Drain was advanced over the wire. Approximately 20 mL of cloudy grayish colored purulent fluid was aspirated. Drain was sutured in place and attached to a chest drainage system. Minimal fluid was draining after placement to the suction. FINDINGS: Loculated posterior right pleural effusion. 16 French drain was successfully placed within the loculated effusion. Approximately 20 mL of purulent looking fluid was removed and findings are suggestive for an empyema. IMPRESSION: CT-guided placement of a chest tube within the right chest empyema. Fluid was sent for culture. Electronically Signed   By: Richarda OverlieAdam  Henn M.D.   On: 06/24/2020 17:22        Scheduled Meds: . [START ON 06/26/2020] alteplase (TPA) for intrapleural administration  10  mg Intrapleural Daily   And  . [START ON 06/26/2020] pulmozyme (DORNASE) for intrapleural administration  5 mg Intrapleural Q24H  . benzonatate  100 mg Oral TID  . feeding supplement  237 mL Oral BID BM  . ferrous sulfate  325 mg Oral TID WC  . guaiFENesin  600 mg Oral BID  . multivitamin with minerals  1 tablet Oral Daily  . nicotine  14 mg Transdermal Daily  . vitamin B-12  1,000 mcg Oral Daily   Continuous  Infusions: . sodium chloride Stopped (06/25/20 0337)  . meropenem (MERREM) IV 1 g (06/25/20 0455)  . vancomycin 200 mL/hr at 06/25/20 0400     LOS: 4 days    Time spent: 41 minutes spent on chart review, discussion with nursing staff, consultants, updating family and interview/physical exam; more than 50% of that time was spent in counseling and/or coordination of care.    Alvira Philips [place], DO [facility] Available via Epic secure chat 7am-7pm After these hours, please refer to coverage provider listed on [email or web address] 06/25/2020, 12:48 PM

## 2020-06-25 NOTE — Plan of Care (Signed)
  Problem: Health Behavior/Discharge Planning: Goal: Ability to manage health-related needs will improve Outcome: Progressing   Problem: Clinical Measurements: Goal: Respiratory complications will improve Outcome: Progressing   Problem: Clinical Measurements: Goal: Diagnostic test results will improve Outcome: Progressing

## 2020-06-25 NOTE — Progress Notes (Signed)
Patient's chest tube unclamped and received drainage immediately.

## 2020-06-25 NOTE — Progress Notes (Signed)
NAME:  Kirk CotaJohnny Carrillo, MRN:  [id number], DOB:  Jun 01, 1961, LOS: 4 ADMISSION DATE:  06/20/2020, CONSULTATION DATE:  06/21/2020 REFERRING MD:  Dr. Allena KatzPatel, CHIEF COMPLAINT:  Loculated pleural effusion  Brief History   [age] yoM with hx of tobacco abuse and HTN presenting with foot wound additionally reporting cough, previously productive, with recent weight loss, exertional dyspnea, and night sweats.  CXR found abnormal, CT chest showing multiple right sided pulmonary cavitations with loculated pleural effusion.    History of present illness   59 year old male with prior history of HTN and tobacco abuse who [facility] of  right food wounds, between his right fourth and fifth toes, which developed over the last several weeks after recently wearing tight fitting shoes.  Found to have a leukocytosis and was unable to be seen by podiatry, therefore sent to ER for further evaluation.  While in ER, patient also complained of worsening cough over the last 6 months with productive sputum with associated 20 lb unintentional weight loss.    Patient has never seen a pulmonologist.  Smoked cigarettes for the last 42 years, up to 1.5 ppd at his heaviest, recently down to 0.5 ppd; no vaping history.  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  Reports history of exposures to chemicals at work and while he worked in [employer].  No pets or work around pets, hot tubs, or mold exposure. He is fully vaccinated against COVID 19.   He reports always having a "smoker's cough" but over the last several months had become productive with foul smelling sputum and occasional blood tinged but now has changed to a dry cough.  Additionally, he feels like he has developed exertional shortness of breath.  Denies fever, syncope, or leg edema  Has been having night sweats in which he has to change his clothes. Denies recent or international travel, IVDA, incarceration, close contacts  with diagnosed tuberculosis, or sick exposures.  Sometimes has post-tussive emesis but denies any dysphagia or choking episodes.  No recent bloody sputum or change in stools.  He does take three aspirin a day because he heard it was good for you.  Occasional ETOH use.   He was afebrile, but tachycardic and tachypneic.  Remains on room air with saturations > 94%.  Workup thus far with right foot xray without evidence of osteomyelitis.  Labs noted for Na 132, K 3.1, CL 97, normal lactic, WBC 13.2, Hgb 8.7, Platelets 537, Mag 1.6, low albumin and protein, PCT 0.15, UA with 20 ketones and high SG.  SARS 2/flu negative. CXR with patchy airspace disease throughout the right lung with small pleural effusion.  A chest CT was obtained which revealed multiple cavitary lesions throughout the right lung, predominately in the right upper lobe with associated areas of traction bronchiectasis; loculated areas of right pleural effusion concerning for empyema, overall concerning for a necrotic pneumonia with empyema versus underlying malignancy.  Cultures sent and empirically placed on meropenem and vancomycin and airborne precautions.  AFB and quantiferon pending.  [facility]  Pulmonary consulted for further recommendations.    Past Medical History  HTN, tobacco abuse, "PVD" per patient  Significant Hospital Events   11/20 Admitted [facility] 11/21  Consults:  Pulmonary 11/20  Procedures:   Significant Diagnostic Tests:  11/21 LDH 4k, gram stain negative, culture pending 11/19 R foot XR >> neg for acute process 11/19 CXR >>Volume loss with patchy  airspace disease throughout the right lung. Cannot exclude pneumonia. Small right pleural effusion.  11/19 CT chest w/ contrast >> 1. Multiple areas of cavitation with extensive reticular change and architectural distortion throughout the right lung, predominantly in the right upper lobe with associated areas of traction bronchiectasis. Several areas of  cavitation appear more thick-walled and peripherally enhancing containing a admixed fluid and foci of gas concerning for superinfection and or abscess formation.  Loculated areas of right pleural effusion visceral and parietal pleural thickening worrisome for empyema as well. While this appearance could reflect sequela of a necrotic pneumonia with empyema, underlying malignancy is not fully excluded. 2. Mediastinal and right hilar adenopathy, possibly reactive or metastatic. 3. Circumferential thickening and mucosal hyperemia of the distal esophagus, correlate for features of esophagitis. Consider further evaluation with direct visualization. 4. Small sliding-type hiatal hernia with distal esophageal thickening and mucosal hyperemia, correlate for features of reflux. 5. Three-vessel coronary artery atherosclerosis. 6. Aortic Atherosclerosis   Micro Data:  11/19 SARS 2/ flu >> neg 11/19 BCx2 >> 11/20 expectorated sputum >>  AFB >>  MTB RIF >>  Antimicrobials:  11/19 meropenem  11/19 zosyn 11/19 vancomycin >>  Interim history/subjective:  No current complaints. Reviewed pleural fluid studies and rationale for intrapleural lytics, expressed understanding and gave consent.   Objective   Blood pressure 120/71, pulse 82, temperature 98.6 F (37 C), temperature source Oral, resp. rate 18, height 5' 7.5" (1.715 m), weight 56.6 kg, SpO2 99 %.        Intake/Output Summary (Last 24 hours) at 06/25/2020 0855 Last data filed at 06/25/2020 0455 Gross per 24 hour  Intake 1388.68 ml  Output 1032 ml  Net 356.68 ml   Filed Weights   06/23/20 0422 06/24/20 0500 06/25/20 0400  Weight: 55.8 kg 55.5 kg 56.6 kg   Examination: seen in airborne precautions General:  Thin adult male sitting in bed in NAD Neuro: AOx 4, MAE CV: rr, no murmur PULM:  Non labored, no cough, right with scattered rhonchi/ rales, left clear GI: soft, bs active  Extremities: warm/dry, no LE edema Skin: no rashes        Right posterior lung bedside ultrasound   Resolved Hospital Problem list    Assessment & Plan:   Multiple right sided pulmonary cavitations and Right loculated pleural effusion, presumed emypema. No risk factors for TB. S/p [facility] with only 15 cc removed that is highly inflammatory, exudative, LDH >4k, PMN predominant, gram stain negative - fits best with empyema/parapneumonic effusion. WBC, fever better on abx. CT small bore chest tube 11/23.   P:  Lytics first dose 11/24 AM, assess drainage, plan for daily x 6 maximum Flush chest tube twice daily with 50 cc NS AFB smears negative, precautions d/c'd Follow culture data, suspect will be negative given abx prior Continue meropenum and vancomycin for now, anticipate transition to Unasyn IV while admitted if cultures remain negative Will need prolonged course of abx for cavitary lesions and presumed empyema, tentative plan for Augmentin for 6 weeks (total time of abx) Appreciate Thoracic surgery assistance, continue to eval as may need decortication  Pulmonary will continue to follow.  Best practice:  Per roimary  Labs   CBC: Recent Labs  Lab 06/20/20 2134 06/20/20 2134 06/21/20 0500 06/22/20 0444 06/23/20 0649 06/24/20 0347 06/25/20 0340  WBC 19.1*   < > 13.2* 13.6* 12.5* 11.3* 9.2  NEUTROABS 15.1*  --  10.1* 10.5*  --   --   --   HGB 9.6*   < >  8.7* 8.1* 8.4* 7.9* 8.3*  HCT 30.7*   < > 28.3* 26.0* 27.0* 25.4* 27.2*  MCV 79.5*   < > 78.6* 77.6* 78.0* 77.9* 78.8*  PLT 673*   < > 537* 433* 494* 515* 548*   < > = values in this interval not displayed.    Basic Metabolic Panel: Recent Labs  Lab 06/20/20 2134 06/21/20 0500 06/22/20 0444 06/23/20 0649 06/25/20 0340  NA 129* 132* 129* 132* 131*  K 3.6 3.1* 3.2* 4.1 4.3  CL 89* 97* 97* 100 98  CO2 25 25 25 25 25   GLUCOSE 88 91 109* 96 99  BUN 11 8 5* 6* 6*  CREATININE 0.69 0.50* 0.38* 0.37* 0.37*  CALCIUM 8.9 8.1* 7.9* 8.1* 8.5*  MG  --  1.6* 1.7  --  1.9   PHOS  --  3.9  --   --   --    GFR: Estimated Creatinine Clearance: 77.6 mL/min (A) (by C-G formula based on SCr of 0.37 mg/dL (L)). Recent Labs  Lab 06/20/20 2134 06/20/20 2147 06/20/20 2319 06/21/20 0500 06/21/20 0500 06/22/20 0444 06/23/20 0649 06/24/20 0347 06/25/20 0340  PROCALCITON  --   --   --  0.15  --   --   --   --   --   WBC   < >  --   --  13.2*   < > 13.6* 12.5* 11.3* 9.2  LATICACIDVEN  --  1.0 0.8  --   --   --   --   --   --    < > = values in this interval not displayed.    Liver Function Tests: Recent Labs  Lab 06/20/20 2134 06/21/20 0500 06/22/20 0444  AST 22 22 23   ALT 20 18 23   ALKPHOS 86 67 63  BILITOT 1.4* 0.7 0.2*  PROT 7.5 6.1* 5.9*  ALBUMIN 2.3* 1.8* 1.6*   Recent Labs  Lab 06/20/20 2134  LIPASE 19   No results for input(s): AMMONIA in the last 168 hours.  ABG No results found for: PHART, PCO2ART, PO2ART, HCO3, TCO2, ACIDBASEDEF, O2SAT   Coagulation Profile: Recent Labs  Lab 06/21/20 0500 06/24/20 0347  INR 1.1 1.1    Cardiac Enzymes: No results for input(s): CKTOTAL, CKMB, CKMBINDEX, TROPONINI in the last 168 hours.  HbA1C: No results found for: HGBA1C  CBG: No results for input(s): GLUCAP in the last 168 hours.  Review of Systems:   Right toe hurts. Sweats better.   Past Medical History  He,  has a past medical history of Hypertension, Iron deficiency anemia, Kidney stone, Malnutrition (HCC), PVD (peripheral vascular disease) (HCC), and Tobacco abuse.   Surgical History   History reviewed. No pertinent surgical history.   Social History   reports that he has been smoking. He has been smoking about 0.50 packs per day. He has never used smokeless tobacco. He reports current alcohol use of about 35.0 standard drinks of alcohol per week. He reports that he does not use drugs.   Family History   His family history includes Breast cancer in his mother.   Allergies Allergies  Allergen Reactions  . Amlodipine  Swelling  . Telmisartan Other (See Comments)    Facial; angioedema.      Home Medications  Prior to Admission medications   Medication Sig Start Date End Date Taking? Authorizing Provider  diphenhydrAMINE (BENADRYL) 25 mg capsule Take 25 mg by mouth in the morning, at noon, and at bedtime.   Yes  [provider]  guaifenesin (ROBITUSSIN) 100 MG/5ML syrup Take 200 mg by mouth 2 times daily at 12 noon and 4 pm.   Yes [provider]  hydrochlorothiazide (HYDRODIURIL) 25 MG tablet Take 1 tablet (25 mg total) by mouth daily. 08/06/12  Yes Earley Favor, NP  ibuprofen (ADVIL) 200 MG tablet Take 400 mg by mouth every 6 (six) hours as needed for moderate pain.   Yes [provider]  albuterol (PROAIR HFA) 108 (90 Base) MCG/ACT inhaler Inhale 1-2 puffs into the lungs every 6 (six) hours as needed for wheezing or shortness of breath. Patient not taking: Reported on 06/21/2020 02/15/20   Georgetta Haber, NP  benzonatate (TESSALON) 100 MG capsule Take 1-2 capsules (100-200 mg total) by mouth 3 (three) times daily as needed for cough. Patient not taking: Reported on 06/21/2020 02/15/20   Georgetta Haber, NP

## 2020-06-25 NOTE — Progress Notes (Signed)
Pharmacy Antibiotic Note  Kirk Carrillo is a 59 y.o. male admitted on 06/20/2020 with pneumonia and Wound infection.  Pharmacy has been consulted for meropenem and vancomycin dosing.  Vancomycin trough today is subtherapeutic at 8 mcg/ml. Trough was drawn appropriately but some doses have been off schedule. Cr has remained stable. Infectious workup continues, ABX likely to be narrowed to Augmentin soon.   Height: 5' 7.5" (171.5 cm) Weight: 56.6 kg (124 lb 12.8 oz) IBW/kg (Calculated) : 67.25  Temp (24hrs), Avg:98.2 F (36.8 C), Min:97.9 F (36.6 C), Max:98.6 F (37 C)  Recent Labs  Lab 06/20/20 2134 06/20/20 2134 06/20/20 2147 06/20/20 2319 06/21/20 0500 06/22/20 0444 06/22/20 1012 06/23/20 0649 06/24/20 0347 06/25/20 0340  WBC 19.1*   < >  --   --  13.2* 13.6*  --  12.5* 11.3* 9.2  CREATININE 0.69  --   --   --  0.50* 0.38*  --  0.37*  --  0.37*  LATICACIDVEN  --   --  1.0 0.8  --   --   --   --   --   --   VANCOTROUGH  --   --   --   --   --   --  <4*  --   --   --    < > = values in this interval not displayed.    Estimated Creatinine Clearance: 77.6 mL/min (A) (by C-G formula based on SCr of 0.37 mg/dL (L)).    Allergies  Allergen Reactions  . Amlodipine Swelling  . Telmisartan Other (See Comments)    Facial; angioedema.     Antimicrobials this admission: 11/19 Zosyn x1 11/19 Vancomycin >>  11/20 Meropenem >>    Microbiology results: 11/21 acid fast smear / culture: negative 11/21 pleural culture: negative  Plan:  -Continue meropenem 1g every 8 hours -Increase vancomycin to 1250mg  IV q12h  , PharmD, BCPS, [facility] Clinical Pharmacist [phone] Please check AMION for all [facility] numbers 06/25/2020

## 2020-06-25 NOTE — Progress Notes (Signed)
   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  I think without revascularization he is at high risk for limb loss.  Noninvasive studies show an ABI of 53% on the right with a toe pressure of 14 mmHg.  Thus I do not think he has adequate circulation to heal these wounds.  He will be scheduled for an arteriogram and possible intervention on Friday.  PNEUMONIA/EMPYEMA: The patient had a 29 French drain placed into the empyema in his right chest yesterday.  His chest x-ray shows a right-sided pneumonia.  His Covid test was negative.  He is currently on vancomycin and meropenem cultures are pending.   SUBJECTIVE:   No complaints this morning.  PHYSICAL EXAM:   Vitals:   06/24/20 1535 06/24/20 1550 06/24/20 1954 06/25/20 0400  BP: (!) 126/93 115/64 108/73 120/71  Pulse: 95 94 82   Resp: (!) 21 20 18 18   Temp:   98.1 F (36.7 C) 98.6 F (37 C)  TempSrc:   Oral Oral  SpO2: 100% 97% 97% 99%  Weight:    56.6 kg  Height:       He has dressings on the toes which are dry.  LABS:   Lab Results  Component Value Date   WBC 9.2 06/25/2020   HGB 8.3 (L) 06/25/2020   HCT 27.2 (L) 06/25/2020   MCV 78.8 (L) 06/25/2020   PLT 548 (H) 06/25/2020   Lab Results  Component Value Date   CREATININE 0.37 (L) 06/25/2020   Lab Results  Component Value Date   INR 1.1 06/24/2020   PROBLEM LIST:    Principal Problem:   Sepsis due to pneumonia [facility]) Active Problems:   Pneumonia of right lung due to infectious organism   Essential hypertension   Empyema of right pleural space (HCC)   Alcohol abuse   Ulcer of toe, right, with fat layer exposed (HCC)   Nicotine dependence, cigarettes, uncomplicated   CURRENT MEDS:   . benzonatate  100 mg Oral TID  . feeding supplement  237 mL Oral  BID BM  . ferrous sulfate  325 mg Oral TID WC  . guaiFENesin  600 mg Oral BID  . multivitamin with minerals  1 tablet Oral Daily  . nicotine  14 mg Transdermal Daily  . vitamin B-12  1,000 mcg Oral Daily    [facility] Office: [phone] 06/25/2020

## 2020-06-25 NOTE — Progress Notes (Signed)
Patient alert and oriented x 4, resps even and unlabored. Pulomologist administered cathflo and pulmonyze directly into the chest tube this morning. He gave instructions to keep clamped for 2 hours then release back to wall suction at previously ordered parameters. Patient tolerated this procedure well and when tube was unclamped, serosanguinous to sanguinous output of was immediately obtained. Patient was also administered a PRN Norco at this time for pain 8/10 in tube insertion site area as well as in 4th and 5th toes. Patient lung sounds remain clear but diminished throughout; he is hesitant to take deep breaths due to discomfort from chest tube insertion. Patient's cough has decreased significantly, and he has not had any sputum today thus far. Bowel sounds normoactive all quads, last BM 11/22. Patient call light within reach, able to make needs known.

## 2020-06-25 NOTE — Progress Notes (Signed)
Patient refusing Ensure, as he reports diarrhea when drinking it.

## 2020-06-26 ENCOUNTER — Inpatient Hospital Stay (HOSPITAL_COMMUNITY): Payer: BLUE CROSS/BLUE SHIELD

## 2020-06-26 DIAGNOSIS — A419 Sepsis, unspecified organism: Secondary | ICD-10-CM | POA: Diagnosis not present

## 2020-06-26 DIAGNOSIS — J90 Pleural effusion, not elsewhere classified: Secondary | ICD-10-CM

## 2020-06-26 DIAGNOSIS — Z9889 Other specified postprocedural states: Secondary | ICD-10-CM

## 2020-06-26 DIAGNOSIS — J869 Pyothorax without fistula: Secondary | ICD-10-CM | POA: Diagnosis not present

## 2020-06-26 DIAGNOSIS — J189 Pneumonia, unspecified organism: Secondary | ICD-10-CM | POA: Diagnosis not present

## 2020-06-26 LAB — CBC
HCT: 27.2 % — ABNORMAL LOW (ref 39.0–52.0)
Hemoglobin: 8.2 g/dL — ABNORMAL LOW (ref 13.0–17.0)
MCH: 24 pg — ABNORMAL LOW (ref 26.0–34.0)
MCHC: 30.1 g/dL (ref 30.0–36.0)
MCV: 79.5 fL — ABNORMAL LOW (ref 80.0–100.0)
Platelets: 588 10*3/uL — ABNORMAL HIGH (ref 150–400)
RBC: 3.42 MIL/uL — ABNORMAL LOW (ref 4.22–5.81)
RDW: 15.9 % — ABNORMAL HIGH (ref 11.5–15.5)
WBC: 7.1 10*3/uL (ref 4.0–10.5)
nRBC: 0 % (ref 0.0–0.2)

## 2020-06-26 LAB — BASIC METABOLIC PANEL
Anion gap: 8 (ref 5–15)
BUN: 5 mg/dL — ABNORMAL LOW (ref 8–23)
CO2: 27 mmol/L (ref 22–32)
Calcium: 8.5 mg/dL — ABNORMAL LOW (ref 8.9–10.3)
Chloride: 97 mmol/L — ABNORMAL LOW (ref 98–111)
Creatinine, Ser: 0.45 mg/dL — ABNORMAL LOW (ref 0.61–1.24)
GFR, Estimated: >60 mL/min (ref >60)
Glucose, Bld: 97 mg/dL (ref 70–99)
Potassium: 3.9 mmol/L (ref 3.5–5.1)
Sodium: 132 mmol/L — ABNORMAL LOW (ref 135–145)

## 2020-06-26 LAB — ACID FAST SMEAR (AFB, MYCOBACTERIA): Acid Fast Smear: NEGATIVE

## 2020-06-26 LAB — MAGNESIUM: Magnesium: 1.9 mg/dL (ref 1.7–2.4)

## 2020-06-26 LAB — C-REACTIVE PROTEIN: CRP: 14.2 mg/dL — ABNORMAL HIGH (ref <1.0)

## 2020-06-26 MED ORDER — LIDOCAINE 5 % EX PTCH
1.0000 | MEDICATED_PATCH | CUTANEOUS | Status: DC
  Administered 2020-06-26 – 2020-06-28 (×3): 1 via TRANSDERMAL
  Filled 2020-06-26 (×3): qty 1

## 2020-06-26 NOTE — Progress Notes (Signed)
      [address]       [name] [address]             [phone]      Subjective:  Patient feels like he is breathing a bit better.   Has pain at his chest tube site.  Isn't really walking due to his wounds on his feet.  He states they are coming this morning to inject more stuff into his chest tube.  Objective: Vital signs in last 24 hours: Temp:  [97.9 F (36.6 C)-98.6 F (37 C)] 98.2 F (36.8 C) (11/25 0338) Pulse Rate:  [67-79] 67 (11/25 0338) Cardiac Rhythm: Normal sinus rhythm (11/24 1902) Resp:  [18-20] 20 (11/25 0338) BP: (100-111)/(54-69) 100/54 (11/25 0338) SpO2:  [98 %-99 %] 99 % (11/25 0338) Weight:  [51.1 kg] 51.1 kg (11/25 0050)  Intake/Output from previous day: 11/24 0701 - 11/25 0700 In: 1586.9 [P.O.:480; I.V.:207.1; IV Piggyback:899.9] Out: 2610 [Urine:2200; Chest Tube:410] Intake/Output this shift: Total I/O In: 480 [P.O.:480] Out: -   General appearance: alert, cooperative and no distress Heart: regular rate and rhythm Lungs: diminished breath sounds right base  Lab Results: Recent Labs    06/25/20 0340 06/26/20 0217  WBC 9.2 7.1  HGB 8.3* 8.2*  HCT 27.2* 27.2*  PLT 548* 588*   BMET:  Recent Labs    06/25/20 0340 06/26/20 0217  NA 131* 132*  K 4.3 3.9  CL 98 97*  CO2 25 27  GLUCOSE 99 97  BUN 6* 5*  CREATININE 0.37* 0.45*  CALCIUM 8.5* 8.5*    PT/INR:  Recent Labs    06/24/20 0347  LABPROT 13.4  INR 1.1   ABG No results found for: PHART, HCO3, TCO2, ACIDBASEDEF, O2SAT CBG (last 3)  No results for input(s): GLUCAP in the last 72 hours.  Assessment/Plan:  1. Right sided loculated pleural effusion- IR drain in place, thrombolytics instilled yesterday, 500 cc output, supposedly being repeat today, no air leak present 2. Care per primary, repeat CXR in AM   LOS: 5 days    Lowella Dandy, PA-C 06/26/2020

## 2020-06-26 NOTE — Progress Notes (Signed)
Patient was seen by intensivist group for injection of tPA and Pulmozyne. Chest tube was clamped for 1.5hrs and when release, 12mL of serosanguinous fluid obtained. This was marked on the collection container and documented with the output from earlier in the shift (29mL) for a total of . Patient tolerated this well.

## 2020-06-26 NOTE — Progress Notes (Signed)
Checked on patient and noted his IV to be infiltrated. Removed from left anterior forearm, catheter intact. Attempted to insert PIV x 2 with no success. Called for assistance.

## 2020-06-26 NOTE — Progress Notes (Signed)
Patient complained of chest tube being uncomfortable and hurting, RN assessed tube, chest tube is patent and connected. RN gave PRN pain med. Will continue to monitor.

## 2020-06-26 NOTE — Progress Notes (Signed)
   NPO Plan for aortogram with Dr. Myrtie Hawk PA-C

## 2020-06-26 NOTE — Progress Notes (Signed)
Received Cathflo and pulmozyne from pharmacy. Verified with AC; this must be injected by a physician. Paged the critical care team physicians on for today through messenger, as none of them were set up in Amion. Waiting for a response.

## 2020-06-26 NOTE — Progress Notes (Signed)
PROGRESS NOTE    Kirk Carrillo  BSW:[id number] DOB: [birth year] DOA: 06/20/2020 PCP: Pcp, No    Brief Narrative:  Kirk Carrillo is a 59 year old male with past medical history significant for or essential hypertension, tobacco use disorder, alcohol use without abuse who presented to the ED with right leg pain.  Pain has progressively gotten worse over the past several days and worse with weightbearing.  Patient has noticed that he has been developing ulcerations between the fourth and fifth toes.  Due to his symptoms, he presented to [facility] health urgent care clinic.  He was initially prescribed a 7-day course of doxycycline and had a podiatry referral placed; but after this was accomplished the urgent care provider recommend patient come to the emergency department for further evaluation.  Additionally, patient complains of worsening cough for the past 6 months.  Associated with foul-smelling sputum.  Also reports a 20 pound unintentional weight loss.  Denies any international travel, no close contact with anyone diagnosed with tuberculosis, no history of IV drug abuse or incarceration.  Denies fevers.  In the ED, x-ray right foot which revealed no evidence of osteomyelitis.  Elevated WBC count with associated tachycardia concerning for sepsis.  Chest x-ray revealing multiple right-sided pulmonary infiltrates.  CT chest with contrast with multiple cavitary lesions throughout the right lung with possible concurrent empyema.  Patient was started on empiric broad-spectrum antibiotics.  Hospitalist service was requested for further evaluation and management.   Assessment & Plan:   Principal Problem:   Sepsis due to pneumonia [facility]) Active Problems:   Pneumonia of right lung due to infectious organism   Essential hypertension   Empyema of right pleural space (HCC)   Alcohol abuse   Ulcer of toe, right, with fat layer exposed (HCC)   Nicotine dependence, cigarettes,  uncomplicated   Multiple right-sided pulmonary cavitations Right loculated pleural effusion/empyema Patient reports chronic cough productive of foul-smelling sputum past 6 months.  No recent travel, no IV drug use/incarceration, no contact with individuals diagnosed with TB.  CT chest with contrast notable for multiple areas of cavitation throughout the right lung, predominantly right upper lobe with several areas of the cavitation thick-walled and peripheral enhancing concerning for abscess.  Underwent IR guided right thoracentesis with 15 mL of clear yellow fluid obtained.  Underwent IR CT-guided chest tube placement on 06/24/2020. S/p TPA/dornase per PCCM to chest tybe 11/24. --PCCM/CTS/IR following, appreciate assistance --CT output past 24h --QuantiFERON gold: Negative --MTB NAA antigen negative --AFB x 2 negative, 3rd specimen: Pending --Pleural fluid culture 06/22/2020: No growth x3 days --Pleural fluid culture 06/24/2020: No growth x 2 days --Flush chest tube twice daily with 50 cc normal saline --Airborne precautions discontinued per pulmonology 11/24 --Lidocaine patch to chest tube insertion site --Continue empiric antibiotics with vancomycin/meropenem, if cultures show no growth, pulmonology likely to transition to Unasyn followed by Augmentin to complete a 6-week antibiotic course --repeat CXR in AM  Peripheral vascular disease Right foot toe ulcers Patient presenting to the ED with right foot pain with new ulcers to fourth/fifth toe.  Right foot x-ray with no acute bony abnormality.  Vascular ultrasound ABI with moderate right and left lower extremity arterial disease --Vascular surgery plans arteriogram with possible intervention on 06/27/2020 --NPO after midnight  Hypokalemia Hypomagnesemia Repleted earlier in the hospitalization.  Potassium 3.9 today. --Continue monitor electrolytes closely  Hyponatremia Etiology likely secondary to poor oral intake. --Sodium 132  today. --Continue to encourage increased oral intake, hydration.  Iron deficiency anemia  Iron level 10, TIBC 221 (low), folate 15.8, B12 398. --Avoiding IV iron secondary to active infection/empyema --Ferrous sulfate 325 mg p.o. 3 times daily  B12 deficiency B12 398. --Cyanocobalamin 1000 mcg p.o. daily  Tobacco use disorder --Nicotine patch  Moderate protein calorie malnutrition Body mass index is 17.38 kg/m. Nutrition Status: Nutrition Problem: Increased nutrient needs Etiology: acute illness (rt pleural effusion) Signs/Symptoms: estimated needs Interventions: Ensure Enlive (each supplement provides 350kcal and 20 grams of protein), MVI --Will discontinue Ensure today as patient reporting diarrhea with use --Continue to encourage increased oral intake    DVT prophylaxis: SCDs Code Status: Full code Family Communication: No family present at bedside this morning  Disposition Plan:  Status is: Inpatient  Remains inpatient appropriate because:Ongoing active pain requiring inpatient pain management, Ongoing diagnostic testing needed not appropriate for outpatient work up, Unsafe d/c plan, IV treatments appropriate due to intensity of illness or inability to take PO and Inpatient level of care appropriate due to severity of illness   Dispo: The patient is from: Home              Anticipated d/c is to: Home              Anticipated d/c date is: > 3 days              Patient currently is not medically stable to d/c.   Consultants:   PCCM  Vascular surgery  Interventional radiology  CTS  Procedures:   Right thoracentesis, IR 06/22/2020  Right chest tube, IR 06/24/2020  Antimicrobials:   Vancomycin 11/19>>  Meropenem 11/20>>  Zosyn 11/19 - 11/20   Subjective: Patient seen and examined bedside, resting comfortably.  Reports some irritation to chest tube site.  Continues with good output from chest tube.  Plan arteriogram tomorrow.  No other questions or  concerns at this time. Denies headache, no visual changes, no chest pain, no palpitations, no abdominal pain, no weakness, no fatigue, no fever/chills/night sweats, no nausea/vomiting, no paresthesias.  No acute concerns overnight per nursing staff.  Objective: Vitals:   06/25/20 1250 06/25/20 2014 06/26/20 0050 06/26/20 0338  BP: 101/62 111/69  (!) 100/54  Pulse: 79 73  67  Resp: Temp: 97.9 F (36.6 C) 98.6 F (37 C)  98.2 F (36.8 C)  TempSrc: Oral Oral  Oral  SpO2: 99% 98%  99%  Weight:   51.1 kg   Height:        Intake/Output Summary (Last 24 hours) at 06/26/2020 1033 Last data filed at 06/26/2020 0900 Gross per 24 hour  Intake 1826.92 ml  Output 1390 ml  Net 436.92 ml   Filed Weights   06/24/20 0500 06/25/20 0400 06/26/20 0050  Weight: 55.5 kg 56.6 kg 51.1 kg    Examination:  General exam: Appears calm and comfortable  Respiratory system: Clear to auscultation. Respiratory effort normal.  Chest tube noted in place. Cardiovascular system: S1 & S2 heard, RRR. No JVD, murmurs, rubs, gallops or clicks. No pedal edema. Gastrointestinal system: Abdomen is nondistended, soft and nontender. No organomegaly or masses felt. Normal bowel sounds heard. Central nervous system: Alert and oriented. No focal neurological deficits. Extremities: Symmetric 5 x 5 power. Skin: Right foot with dressing in place Psychiatry: Judgement and insight appear normal. Mood & affect appropriate.     Data Reviewed: I have personally reviewed following labs and imaging studies  CBC: Recent Labs  Lab 06/20/20 2134 06/20/20 2134 06/21/20 0500 06/21/20 0500 06/22/20  [id number] 06/23/20 0649 06/24/20 0347 06/25/20 0340 06/26/20 0217  WBC 19.1*   < > 13.2*   < > 13.6* 12.5* 11.3* 9.2 7.1  NEUTROABS 15.1*  --  10.1*  --  10.5*  --   --   --   --   HGB 9.6*   < > 8.7*   < > 8.1* 8.4* 7.9* 8.3* 8.2*  HCT 30.7*   < > 28.3*   < > 26.0* 27.0* 25.4* 27.2* 27.2*  MCV 79.5*   < > 78.6*   < >  77.6* 78.0* 77.9* 78.8* 79.5*  PLT 673*   < > 537*   < > 433* 494* 515* 548* 588*   < > = values in this interval not displayed.   Basic Metabolic Panel: Recent Labs  Lab 06/21/20 0500 06/22/20 0444 06/23/20 0649 06/25/20 0340 06/26/20 0217  NA 132* 129* 132* 131* 132*  K 3.1* 3.2* 4.1 4.3 3.9  CL 97* 97* 100 98 97*  CO2 25 25 25 25 27   GLUCOSE 91 109* 96 99 97  BUN 8 5* 6* 6* 5*  CREATININE 0.50* 0.38* 0.37* 0.37* 0.45*  CALCIUM 8.1* 7.9* 8.1* 8.5* 8.5*  MG 1.6* 1.7  --  1.9 1.9  PHOS 3.9  --   --   --   --    GFR: Estimated Creatinine Clearance: 70.1 mL/min (A) (by C-G formula based on SCr of 0.45 mg/dL (L)). Liver Function Tests: Recent Labs  Lab 06/20/20 2134 06/21/20 0500 06/22/20 0444  AST 22 22 23   ALT 20 18 23   ALKPHOS 86 67 63  BILITOT 1.4* 0.7 0.2*  PROT 7.5 6.1* 5.9*  ALBUMIN 2.3* 1.8* 1.6*   Recent Labs  Lab 06/20/20 2134  LIPASE 19   No results for input(s): AMMONIA in the last 168 hours. Coagulation Profile: Recent Labs  Lab 06/21/20 0500 06/24/20 0347  INR 1.1 1.1   Cardiac Enzymes: No results for input(s): CKTOTAL, CKMB, CKMBINDEX, TROPONINI in the last 168 hours. BNP (last 3 results) No results for input(s): PROBNP in the last 8760 hours. HbA1C: No results for input(s): HGBA1C in the last 72 hours. CBG: No results for input(s): GLUCAP in the last 168 hours. Lipid Profile: No results for input(s): CHOL, HDL, LDLCALC, TRIG, CHOLHDL, LDLDIRECT in the last 72 hours. Thyroid Function Tests: No results for input(s): TSH, T4TOTAL, FREET4, T3FREE, THYROIDAB in the last 72 hours. Anemia Panel: No results for input(s): VITAMINB12, FOLATE, FERRITIN, TIBC, IRON, RETICCTPCT in the last 72 hours. Sepsis Labs: Recent Labs  Lab 06/20/20 2147 06/20/20 2319 06/21/20 0500  PROCALCITON  --   --  0.15  LATICACIDVEN 1.0 0.8  --     Recent Results (from the past 240 hour(s))  Blood culture (routine x 2)     Status: None   Collection Time:  06/20/20  9:45 PM   Specimen: BLOOD LEFT ARM  Result Value Ref Range Status   Specimen Description BLOOD LEFT ARM  Final   Special Requests   Final    BOTTLES DRAWN AEROBIC AND ANAEROBIC Blood Culture adequate volume   Culture   Final    NO GROWTH 5 DAYS Performed at [facility], [address] [place], [place] [id number]    Report Status 06/25/2020 FINAL  Final  Blood culture (routine x 2)     Status: None   Collection Time: 06/20/20  9:46 PM   Specimen: BLOOD RIGHT ARM  Result Value Ref Range Status   Specimen  Description BLOOD RIGHT ARM  Final   Special Requests   Final    BOTTLES DRAWN AEROBIC AND ANAEROBIC Blood Culture adequate volume   Culture   Final    NO GROWTH 5 DAYS Performed at [facility], [address], [place], [place] [address]    Report Status 06/25/2020 FINAL  Final  Respiratory Panel by RT PCR (Flu A&B, Covid) - Nasopharyngeal Swab     Status: None   Collection Time: 06/20/20 10:48 PM   Specimen: Nasopharyngeal Swab; Nasopharyngeal(NP) swabs in vial transport medium  Result Value Ref Range Status   SARS Coronavirus 2 by RT PCR NEGATIVE NEGATIVE Final    Comment: (NOTE) SARS-CoV-2 target nucleic acids are NOT DETECTED.  The SARS-CoV-2 RNA is generally detectable in upper respiratoy specimens during the acute phase of infection. The lowest concentration of SARS-CoV-2 viral copies this assay can detect is 131 copies/mL. A negative result does not preclude SARS-Cov-2 infection and should not be used as the sole basis for treatment or other patient management decisions. A negative result may occur with  improper specimen collection/handling, submission of specimen other than nasopharyngeal swab, presence of viral mutation(s) within the areas targeted by this assay, and inadequate number of viral copies (<131 copies/mL). A negative result must be combined with clinical observations, patient history, and epidemiological information. The expected  result is Negative.  Fact Sheet for Patients:  https://www.moore.com/  Fact Sheet for Healthcare Providers:  https://www.young.biz/  This test is no t yet approved or cleared by the [place] FDA and  has been authorized for detection and/or diagnosis of SARS-CoV-2 by FDA under an Emergency Use Authorization (EUA). This EUA will remain  in effect (meaning this test can be used) for the duration of the COVID-19 declaration under Section 564(b)(1) of the Act, 21 U.S.C. section 360bbb-3(b)(1), unless the authorization is terminated or revoked sooner.     Influenza A by PCR NEGATIVE NEGATIVE Final   Influenza B by PCR NEGATIVE NEGATIVE Final    Comment: (NOTE) The Xpert Xpress SARS-CoV-2/FLU/RSV assay is intended as an aid in  the diagnosis of influenza from Nasopharyngeal swab specimens and  should not be used as a sole basis for treatment. Nasal washings and  aspirates are unacceptable for Xpert Xpress SARS-CoV-2/FLU/RSV  testing.  Fact Sheet for Patients: https://www.moore.com/  Fact Sheet for Healthcare Providers: https://www.young.biz/  This test is not yet approved or cleared by the [place] FDA and  has been authorized for detection and/or diagnosis of SARS-CoV-2 by  FDA under an Emergency Use Authorization (EUA). This EUA will remain  in effect (meaning this test can be used) for the duration of the  Covid-19 declaration under Section 564(b)(1) of the Act, 21  U.S.C. section 360bbb-3(b)(1), unless the authorization is  terminated or revoked. Performed at [facility], [address], [place], [place] [address]   Expectorated sputum assessment w rflx to resp cult     Status: None   Collection Time: 06/21/20  1:16 AM   Specimen: Expectorated Sputum  Result Value Ref Range Status   Specimen Description EXPECTORATED SPUTUM  Final   Special Requests NONE  Final   Sputum evaluation    Final    THIS SPECIMEN IS ACCEPTABLE FOR SPUTUM CULTURE Performed at [facility], [address], [place], [place] [address]    Report Status 06/21/2020 FINAL  Final  Culture, respiratory     Status: None   Collection Time: 06/21/20  1:16 AM  Result Value Ref Range Status   Specimen Description EXPECTORATED SPUTUM  Final   Special Requests NONE Reflexed from [id number]  Final   Gram Stain   Final    ABUNDANT WBC PRESENT, PREDOMINANTLY PMN RARE SQUAMOUS EPITHELIAL CELLS PRESENT MODERATE YEAST RARE GRAM POSITIVE COCCI RARE GRAM NEGATIVE RODS    Culture   Final    Normal respiratory flora-no Staph aureus or Pseudomonas seen Performed at [facility], [address], [place], [place] [address]    Report Status 06/23/2020 FINAL  Final  MTB RIF NAA w/o Culture, Sputum     Status: None   Collection Time: 06/21/20  3:55 AM   Specimen: Expectorated Sputum  Result Value Ref Range Status   M Tuberculosis Complex Comment NOT DETECTED Final    Comment: (NOTE) Mycobacterium tuberculosis complex (MTBC) NOT detected. Per the College of American Pathologists (CAP) guidelines, a culture must be performed on all samples regardless of the molecular test result. By ordering this test code, the client has assumed responsibility for the performance of the culture.    Rifampin Comment Susceptible Final    Comment: (NOTE) Because Mycobacterium tuberculosis complex (MTBC) was not detected, no rifampin determination is possible.    AFB Specimen Processing Concentration  Final    Comment: (NOTE) Performed At: [facility] [address] [place], [place] [id number] Jolene Schimke MD NG:[id number]   Acid Fast Smear (AFB)     Status: None   Collection Time: 06/22/20  8:17 AM   Specimen: Sputum  Result Value Ref Range Status   AFB Specimen Processing Concentration  Final   Acid Fast Smear Negative  Final    Comment: (NOTE) Performed At: [facility] [address]  [place], [place] [id number] Jolene Schimke MD GU:[id number]    Source (AFB) SPU  Final    Comment: Performed at [facility], [address], [place], [place] [address]  Body fluid culture     Status: None   Collection Time: 06/22/20 11:23 AM   Specimen: PATH Cytology Pleural fluid  Result Value Ref Range Status   Specimen Description PLEURAL FLUID  Final   Special Requests RIGHT  Final   Gram Stain   Final    ABUNDANT WBC PRESENT, PREDOMINANTLY PMN NO ORGANISMS SEEN    Culture   Final    NO GROWTH 3 DAYS Performed at [facility], [address], [place], [place] [address]    Report Status 06/25/2020 FINAL  Final  Acid Fast Smear (AFB)     Status: None   Collection Time: 06/22/20  4:27 PM   Specimen: Pleural; Respiratory  Result Value Ref Range Status   AFB Specimen Processing Concentration  Final   Acid Fast Smear Negative  Final    Comment: (NOTE) Performed At: [facility] [address] [place], [place] [id number] Jolene Schimke MD SA:[id number]    Source (AFB) PLEURAL  Final    Comment: FLUID Performed at [facility], [address] [place], [place] [address]   Acid Fast Smear (AFB)     Status: None   Collection Time: 06/23/20  5:00 AM   Specimen: Sputum  Result Value Ref Range Status   AFB Specimen Processing Concentration  Final   Acid Fast Smear Negative  Final    Comment: (NOTE) Performed At: [facility] [address] [place], [place] [id number] Jolene Schimke MD CW:[id number]    Source (AFB) EXPECTORATED SPUTUM  Final    Comment: Performed  at [facility], [address], [place], [place] [address]  Aerobic/Anaerobic Culture (surgical/deep wound)     Status: None (Preliminary result)   Collection Time: 06/24/20  5:23 PM   Specimen: Pleural Fluid  Result Value Ref Range Status   Specimen Description PLEURAL  Final   Special Requests NONE  Final   Gram Stain   Final    ABUNDANT WBC PRESENT, PREDOMINANTLY PMN NO  ORGANISMS SEEN    Culture   Final    NO GROWTH 2 DAYS Performed at [facility], [address], [place], [place] [address]    Report Status PENDING  Incomplete  Acid Fast Smear (AFB)     Status: None   Collection Time: 06/24/20  6:19 PM   Specimen: Sputum  Result Value Ref Range Status   AFB Specimen Processing Concentration  Final   Acid Fast Smear Negative  Final    Comment: (NOTE) Performed At: [facility] [address] [place], [place] [id number] Jolene Schimke MD JA:[id number]    Source (AFB) EXPECTORATED SPUTUM  Final    Comment: Performed at [facility], [address], [place], [place] [address]         Radiology Studies: DG Chest Port 1 View  Result Date: 06/26/2020 CLINICAL DATA:  Follow-up right chest tube. EXAM: PORTABLE CHEST 1 VIEW COMPARISON:  06/25/2020 FINDINGS: Right basilar chest tube is unchanged in position from previous exam. No pneumothorax identified. Small right loculated pleural effusion is unchanged from previous exam. Atelectasis and scarring within the right midlung and right lower lobe unchanged. Left lung is clear. IMPRESSION: 1. Stable position of right chest tube. No pneumothorax. 2. No change in small loculated right pleural effusion. Electronically Signed   By: Signa Kell M.D.   On: 06/26/2020 08:57   DG Chest Port 1 View  Result Date: 06/25/2020 CLINICAL DATA:  PleurX catheter placement EXAM: PORTABLE CHEST 1 VIEW COMPARISON:  June 22, 2020 FINDINGS: PleurX catheter tip is in the right lower lung region. No pneumothorax. Small loculated pleural effusion noted on the right. There is ill-defined airspace opacity in the right mid and lower lung zones, primarily due to scarring and atelectatic change. There is slight atelectasis in the medial left base. There is a small granuloma in the left upper lobe. Left lung otherwise clear. Heart size and pulmonary vascular normal. No adenopathy. There is aortic atherosclerosis.  There are foci of calcification in each carotid artery. No bone lesions. IMPRESSION: PleurX catheter with tip in right base region. No pneumothorax. Small loculated pleural effusion on the right. Areas of apparent scarring and atelectasis right mid and lower lung regions. Mild atelectasis medial left base. Scattered granulomas on the left. No edema or airspace opacity. Stable cardiac silhouette. Aortic Atherosclerosis (ICD10-I70.0). Electronically Signed   By: Bretta Bang III M.D.   On: 06/25/2020 08:19   CT [facility] PLEURAL DRAIN W/INDWELL CATH W/IMG GUIDE  Result Date: 06/24/2020 INDICATION: 59 year old with a loculated right pleural effusion and only a small amount of fluid could be removed during thoracentesis. Request for a large-bore chest tube. Patient also has cavitary lesions in the right lung. EXAM: CT-GUIDED RIGHT CHEST TUBE PLACEMENT MEDICATIONS: Moderate sedation ANESTHESIA/SEDATION: Fentanyl 50 mcg IV; Versed 1.5 mg IV Moderate Sedation Time:  23 minutes The patient was continuously monitored during the procedure by the interventional radiology nurse under my direct supervision. COMPLICATIONS: None immediate. PROCEDURE: Informed written consent was obtained from the patient after a thorough discussion of  the procedural risks, benefits and alternatives. All questions were addressed. Maximal Sterile Barrier Technique was utilized including caps, mask, sterile gowns, sterile gloves, sterile drape, hand hygiene and skin antiseptic. A timeout was performed prior to the initiation of the procedure. Patient was placed prone. CT images through the chest were obtained. The loculated posterior right pleural effusion was identified and targeted. The right side of the back was prepped with chlorhexidine and sterile field was created. Skin was anesthetized using 1% lidocaine. Using CT guidance, a Yueh catheter was directed into the loculated effusion and a small amount of yellow fluid was aspirated. A J wire  was advanced into the pleural space and the tract was dilated to accommodate a 16 [place] Thal- Quick drain. Drain was advanced over the wire. Approximately 20 mL of cloudy grayish colored purulent fluid was aspirated. Drain was sutured in place and attached to a chest drainage system. Minimal fluid was draining after placement to the suction. FINDINGS: Loculated posterior right pleural effusion. 16 French drain was successfully placed within the loculated effusion. Approximately 20 mL of purulent looking fluid was removed and findings are suggestive for an empyema. IMPRESSION: CT-guided placement of a chest tube within the right chest empyema. Fluid was sent for culture. Electronically Signed   By: Richarda Overlie M.D.   On: 06/24/2020 17:22        Scheduled Meds: . alteplase (TPA) for intrapleural administration  10 mg Intrapleural Daily   And  . pulmozyme (DORNASE) for intrapleural administration  5 mg Intrapleural Q24H  . benzonatate  100 mg Oral TID  . feeding supplement  237 mL Oral BID BM  . ferrous sulfate  325 mg Oral TID WC  . guaiFENesin  600 mg Oral BID  . lidocaine  1 patch Transdermal Q24H  . multivitamin with minerals  1 tablet Oral Daily  . nicotine  14 mg Transdermal Daily  . vitamin B-12  1,000 mcg Oral Daily   Continuous Infusions: . sodium chloride Stopped (06/26/20 0619)  . meropenem (MERREM) IV Stopped (06/26/20 0559)  . vancomycin Stopped (06/26/20 0509)     LOS: 5 days    Time spent: 41 minutes spent on chart review, discussion with nursing staff, consultants, updating family and interview/physical exam; more than 50% of that time was spent in counseling and/or coordination of care.    Alvira Philips [place], DO [facility] Available via Epic secure chat 7am-7pm After these hours, please refer to coverage provider listed on [email or web address] 06/26/2020, 10:33 AM

## 2020-06-26 NOTE — Progress Notes (Signed)
Patient alert and oriented x 4 this morning. Lungs diminished but clear, chest tube to right posterior flank area patent and draining serous fluid. Patient had some output over night, approximately 90cc. Bowel sounds active x 4 in all quads but no BM reported since evening of the 22. Patient was assisted up today so that he could self perform ADLs like shaving, washing face, washing up at the sink and changing clothes. Ulcers to right toes covered with dressing which is clean, dry and intact. Expected MD to come and inject tPA and pulmozyne this morning but he did not arrive. I sent message asking if he was coming and/or if this could be administered by nurse, but I have not received an answer. I messaged pharmacy to ask for medications, as they are not available in floor Pyxis, and am awaiting guidance. Patient is scheduled for his arteriogram tomorrow at 9am; consent obtained. Patient continues to have good pain control with the Q4hr PRN Norco 5mg , and will ask for it when needed. He continues on Meropenem and Vanc as scheduled.

## 2020-06-26 NOTE — Plan of Care (Signed)
  Problem: Health Behavior/Discharge Planning: Goal: Ability to manage health-related needs will improve Outcome: Progressing   Problem: Clinical Measurements: Goal: Ability to maintain clinical measurements within normal limits will improve Outcome: Progressing   Problem: Skin Integrity: Goal: Risk for impaired skin integrity will decrease Outcome: Progressing   

## 2020-06-26 NOTE — Progress Notes (Signed)
Pleural Fibrinolytic Administration Procedure Note  Kirk Carrillo  [id number]  29-Aug-1960  Date:06/26/20  Time:3:24 PM   Provider Performing:Jaylnn Ullery Veleta Miners   Procedure: Pleural Fibrinolysis Subsequent day (93235)  Indication(s) Fibrinolysis of complicated pleural effusion  Consent Risks of the procedure as well as the alternatives and risks of each were explained to the patient and/or caregiver.  Consent for the procedure was obtained.   Anesthesia None   Time Out Verified patient identification, verified procedure, site/side was marked, verified correct patient position, special equipment/implants available, medications/allergies/relevant history reviewed, required imaging and test results available.   Sterile Technique Hand hygiene, gloves   Procedure Description Existing pleural catheter was cleaned and accessed in sterile manner.  10mg  of tPA in 30cc of saline and 5mg  of dornase in 30cc of sterile water were injected into pleural space using existing pleural catheter.  Catheter will be clamped for 1 hour and then placed back to suction. Dressing change completed.     Complications/Tolerance None; patient tolerated the procedure well.  EBL None   Specimen(s) None  , MSN, NP-C, AGACNP-BC [facility] 06/26/2020, 3:24 PM   Please see [email or web address] for pager details.

## 2020-06-26 NOTE — Progress Notes (Signed)
NAME:  Kirk Carrillo, MRN:  [id number], DOB:  [birth year], LOS: 5 ADMISSION DATE:  06/20/2020, CONSULTATION DATE:  06/21/2020 REFERRING MD:  Dr. Allena Katz, CHIEF COMPLAINT:  Loculated pleural effusion  Brief History   52 yoM with hx of tobacco abuse and HTN presenting with foot wound additionally reporting cough, previously productive, with recent weight loss, exertional dyspnea, and night sweats.  CXR found abnormal, CT chest showing multiple right sided pulmonary cavitations with loculated pleural effusion.    History of present illness   59 year old male with prior history of HTN and tobacco abuse who was sent [name] for evaluation of  right food wounds, between his right fourth and fifth toes, which developed over the last several weeks after recently wearing tight fitting shoes.  Found to have a leukocytosis and was unable to be seen by podiatry, therefore sent to ER for further evaluation.  While in ER, patient also complained of worsening cough over the last 6 months with productive sputum with associated 20 lb unintentional weight loss.    Patient has never seen a pulmonologist.  Smoked cigarettes for the last 42 years, up to 1.5 ppd at his heaviest, recently down to 0.5 ppd; no vaping history.  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  Reports history of exposures to chemicals at work and while he worked in [employer].  No pets or work around pets, hot tubs, or mold exposure. He is fully vaccinated against COVID 19.   He reports always having a "smoker's cough" but over the last several months had become productive with foul smelling sputum and occasional blood tinged but now has changed to a dry cough.  Additionally, he feels like he has developed exertional shortness of breath.  Denies fever, syncope, or leg edema  Has been having night sweats in which he has to change his clothes. Denies recent or international travel, IVDA, incarceration, close contacts  with diagnosed tuberculosis, or sick exposures.  Sometimes has post-tussive emesis but denies any dysphagia or choking episodes.  No recent bloody sputum or change in stools.  He does take three aspirin a day because he heard it was good for you.  Occasional ETOH use.   He was afebrile, but tachycardic and tachypneic.  Remains on room air with saturations > 94%.  Workup thus far with right foot xray without evidence of osteomyelitis.  Labs noted for Na 132, K 3.1, CL 97, normal lactic, WBC 13.2, Hgb 8.7, Platelets 537, Mag 1.6, low albumin and protein, PCT 0.15, UA with 20 ketones and high SG.  SARS 2/flu negative. CXR with patchy airspace disease throughout the right lung with small pleural effusion.  A chest CT was obtained which revealed multiple cavitary lesions throughout the right lung, predominately in the right upper lobe with associated areas of traction bronchiectasis; loculated areas of right pleural effusion concerning for empyema, overall concerning for a necrotic pneumonia with empyema versus underlying malignancy.  Cultures sent and empirically placed on meropenem and vancomycin and airborne precautions.  AFB and quantiferon pending.  He was [facility] to [facility].  Pulmonary consulted for further recommendations.    Past Medical History  HTN, tobacco abuse, "PVD" per patient  Significant Hospital Events   11/20 Admitted [facility] 11/21  Consults:  Pulmonary 11/20  Procedures:   Significant Diagnostic Tests:  11/21 LDH 4k, gram stain negative, culture pending 11/19 R foot XR >> neg for acute process 11/19 CXR >>Volume loss with patchy  airspace disease throughout the right lung. Cannot exclude pneumonia. Small right pleural effusion.  11/19 CT chest w/ contrast >> 1. Multiple areas of cavitation with extensive reticular change and architectural distortion throughout the right lung, predominantly in the right upper lobe with associated areas of traction bronchiectasis. Several areas of  cavitation appear more thick-walled and peripherally enhancing containing a admixed fluid and foci of gas concerning for superinfection and or abscess formation.  Loculated areas of right pleural effusion visceral and parietal pleural thickening worrisome for empyema as well. While this appearance could reflect sequela of a necrotic pneumonia with empyema, underlying malignancy is not fully excluded. 2. Mediastinal and right hilar adenopathy, possibly reactive or metastatic. 3. Circumferential thickening and mucosal hyperemia of the distal esophagus, correlate for features of esophagitis. Consider further evaluation with direct visualization. 4. Small sliding-type hiatal hernia with distal esophageal thickening and mucosal hyperemia, correlate for features of reflux. 5. Three-vessel coronary artery atherosclerosis. 6. Aortic Atherosclerosis   Micro Data:  11/19 SARS 2/ flu >> neg 11/19 BCx2 >> 11/20 expectorated sputum >>  AFB >>  MTB RIF >>  Antimicrobials:  11/19 meropenem  11/19 zosyn 11/19 vancomycin >>  Interim history/subjective:   Comfortable. No issues overnight. Tolerated TPA/DNAse yesterday. Ok output   Objective   Blood pressure (!) 100/54, pulse 67, temperature 98.2 F (36.8 C), temperature source Oral, resp. rate 20, height 5' 7.5" (1.715 m), weight 51.1 kg, SpO2 99 %.        Intake/Output Summary (Last 24 hours) at 06/26/2020 1524 Last data filed at 06/26/2020 1300 Gross per 24 hour  Intake 2306.92 ml  Output 1590 ml  Net 716.92 ml   Filed Weights   06/24/20 0500 06/25/20 0400 06/26/20 0050  Weight: 55.5 kg 56.6 kg 51.1 kg   Examination: seen in airborne precautions General:  Thing, male, comfortable in bed  Neuro: alert oriented, following commands  CV: RRR, s1 s2 PULM:  Diminished BL breath sounds, no crackles, no wheeze  GI: nt nd  Extremities: no edema  Skin: no rash    Resolved Hospital Problem list    Assessment & Plan:   Right sided empyema   P:  In need of continued intrapleural fibrinolytic therapy  Plan for TPA and DNAse again today  Patient consented  Continue abx  augmentin 6 weeks   Pulmonary consult service will follow.   Best practice:  Per roimary  Labs   CBC: Recent Labs  Lab 06/20/20 2134 06/20/20 2134 06/21/20 0500 06/21/20 0500 06/22/20 0444 06/23/20 0649 06/24/20 0347 06/25/20 0340 06/26/20 0217  WBC 19.1*   < > 13.2*   < > 13.6* 12.5* 11.3* 9.2 7.1  NEUTROABS 15.1*  --  10.1*  --  10.5*  --   --   --   --   HGB 9.6*   < > 8.7*   < > 8.1* 8.4* 7.9* 8.3* 8.2*  HCT 30.7*   < > 28.3*   < > 26.0* 27.0* 25.4* 27.2* 27.2*  MCV 79.5*   < > 78.6*   < > 77.6* 78.0* 77.9* 78.8* 79.5*  PLT 673*   < > 537*   < > 433* 494* 515* 548* 588*   < > = values in this interval not displayed.    Basic Metabolic Panel: Recent Labs  Lab 06/21/20 0500 06/22/20 0444 06/23/20 0649 06/25/20 0340 06/26/20 0217  NA 132* 129* 132* 131* 132*  K 3.1* 3.2* 4.1 4.3 3.9  CL 97* 97* 100 98 97*  CO2 25 25 25 25 27   GLUCOSE 91 109* 96 99 97  BUN 8 5* 6* 6* 5*  CREATININE 0.50* 0.38* 0.37* 0.37* 0.45*  CALCIUM 8.1* 7.9* 8.1* 8.5* 8.5*  MG 1.6* 1.7  --  1.9 1.9  PHOS 3.9  --   --   --   --    GFR: Estimated Creatinine Clearance: 70.1 mL/min (A) (by C-G formula based on SCr of 0.45 mg/dL (L)). Recent Labs  Lab 06/20/20 2134 06/20/20 2147 06/20/20 2319 06/21/20 0500 06/22/20 0444 06/23/20 0649 06/24/20 0347 06/25/20 0340 06/26/20 0217  PROCALCITON  --   --   --  0.15  --   --   --   --   --   WBC   < >  --   --  13.2*   < > 12.5* 11.3* 9.2 7.1  LATICACIDVEN  --  1.0 0.8  --   --   --   --   --   --    < > = values in this interval not displayed.    Liver Function Tests: Recent Labs  Lab 06/20/20 2134 06/21/20 0500 06/22/20 0444  AST 22 22 23   ALT 20 18 23   ALKPHOS 86 67 63  BILITOT 1.4* 0.7 0.2*  PROT 7.5 6.1* 5.9*  ALBUMIN 2.3* 1.8* 1.6*   Recent Labs  Lab 06/20/20 2134  LIPASE 19   No  results for input(s): AMMONIA in the last 168 hours.  ABG No results found for: PHART, PCO2ART, PO2ART, HCO3, TCO2, ACIDBASEDEF, O2SAT   Coagulation Profile: Recent Labs  Lab 06/21/20 0500 06/24/20 0347  INR 1.1 1.1    Cardiac Enzymes: No results for input(s): CKTOTAL, CKMB, CKMBINDEX, TROPONINI in the last 168 hours.  HbA1C: No results found for: HGBA1C  CBG: No results for input(s): GLUCAP in the last 168 hours.  Review of Systems:   Right toe hurts. Sweats better.   Past Medical History  He,  has a past medical history of Hypertension, Iron deficiency anemia, Kidney stone, Malnutrition (HCC), PVD (peripheral vascular disease) (HCC), and Tobacco abuse.   Surgical History   History reviewed. No pertinent surgical history.   Social History   reports that he has been smoking. He has been smoking about 0.50 packs per day. He has never used smokeless tobacco. He reports current alcohol use of about 35.0 standard drinks of alcohol per week. He reports that he does not use drugs.   Family History   His family history includes Breast cancer in his mother.   Allergies Allergies  Allergen Reactions  . Amlodipine Swelling  . Telmisartan Other (See Comments)    Facial; angioedema.      Home Medications  Prior to Admission medications   Medication Sig Start Date End Date Taking? Authorizing Provider  diphenhydrAMINE (BENADRYL) 25 mg capsule Take 25 mg by mouth in the morning, at noon, and at bedtime.   Yes [provider]  guaifenesin (ROBITUSSIN) 100 MG/5ML syrup Take 200 mg by mouth 2 times daily at 12 noon and 4 pm.   Yes [provider]  hydrochlorothiazide (HYDRODIURIL) 25 MG tablet Take 1 tablet (25 mg total) by mouth daily. 08/06/12  Yes 06/23/20, NP  ibuprofen (ADVIL) 200 MG tablet Take 400 mg by mouth every 6 (six) hours as needed for moderate pain.   Yes [provider]  albuterol (PROAIR HFA) 108 (90 Base) MCG/ACT inhaler Inhale 1-2  puffs into the lungs every 6 (six) hours as  needed for wheezing or shortness of breath. Patient not taking: Reported on 06/21/2020 02/15/20   Georgetta HaberBurky, Natalie B, NP  benzonatate (TESSALON) 100 MG capsule Take 1-2 capsules (100-200 mg total) by mouth 3 (three) times daily as needed for cough. Patient not taking: Reported on 06/21/2020 02/15/20   Georgetta HaberBurky, Natalie B, NP     Josephine IgoBradley L Athleen Feltner, DO [facility] 06/26/2020 3:24 PM

## 2020-06-26 NOTE — Progress Notes (Signed)
   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  I think without revascularization he is at high risk for limb loss.  Noninvasive studies show an ABI of 53% on the right with a toe pressure of 14 mmHg.  Thus I do not think he has adequate circulation to heal these wounds.  He will be scheduled for an arteriogram and possible intervention on Friday.  PNEUMONIA/EMPYEMA: The patient had a 82 French drain placed into the empyema in his right chest yesterday.  His chest x-ray shows a right-sided pneumonia.  His Covid test was negative.  He is currently on vancomycin and meropenem cultures are pending.   SUBJECTIVE:   No complaints this morning.  PHYSICAL EXAM:   Vitals:   06/25/20 1250 06/25/20 2014 06/26/20 0050 06/26/20 0338  BP: 101/62 111/69  (!) 100/54  Pulse: 79 73  67  Resp: 20 18  20   Temp: 97.9 F (36.6 C) 98.6 F (37 C)  98.2 F (36.8 C)  TempSrc: Oral Oral  Oral  SpO2: 99% 98%  99%  Weight:   51.1 kg   Height:       Non palpable RLE pedal pulses Interdigital ulcers between toes 4-5 on the right  LABS:   Lab Results  Component Value Date   WBC 7.1 06/26/2020   HGB 8.2 (L) 06/26/2020   HCT 27.2 (L) 06/26/2020   MCV 79.5 (L) 06/26/2020   PLT 588 (H) 06/26/2020   Lab Results  Component Value Date   CREATININE 0.45 (L) 06/26/2020   Lab Results  Component Value Date   INR 1.1 06/24/2020   PROBLEM LIST:    Principal Problem:   Sepsis due to pneumonia [facility]) Active Problems:   Pneumonia of right lung due to infectious organism   Essential hypertension   Empyema of right pleural space (HCC)   Alcohol abuse   Ulcer of toe, right, with fat layer exposed (HCC)   Nicotine dependence, cigarettes, uncomplicated   CURRENT MEDS:   . alteplase (TPA) for  intrapleural administration  10 mg Intrapleural Daily   And  . pulmozyme (DORNASE) for intrapleural administration  5 mg Intrapleural Q24H  . benzonatate  100 mg Oral TID  . feeding supplement  237 mL Oral BID BM  . ferrous sulfate  325 mg Oral TID WC  . guaiFENesin  600 mg Oral BID  . lidocaine  1 patch Transdermal Q24H  . multivitamin with minerals  1 tablet Oral Daily  . nicotine  14 mg Transdermal Daily  . vitamin B-12  1,000 mcg Oral Daily    [facility] Rande Brunt, MD [facility] Phone Number: [phone] 06/26/2020 3:42 PM

## 2020-06-27 ENCOUNTER — Encounter (HOSPITAL_COMMUNITY): Payer: Self-pay | Admitting: Internal Medicine

## 2020-06-27 ENCOUNTER — Ambulatory Visit (HOSPITAL_COMMUNITY): Admission: RE | Admit: 2020-06-27 | Payer: BLUE CROSS/BLUE SHIELD | Source: Home / Self Care

## 2020-06-27 ENCOUNTER — Encounter (HOSPITAL_COMMUNITY)
Admission: EM | Disposition: A | Discharge status: Home / Self Care | Payer: Self-pay | Source: Ambulatory Visit | Attending: Internal Medicine

## 2020-06-27 ENCOUNTER — Other Ambulatory Visit (HOSPITAL_COMMUNITY): Payer: BLUE CROSS/BLUE SHIELD

## 2020-06-27 ENCOUNTER — Inpatient Hospital Stay (HOSPITAL_COMMUNITY): Payer: BLUE CROSS/BLUE SHIELD

## 2020-06-27 DIAGNOSIS — I739 Peripheral vascular disease, unspecified: Secondary | ICD-10-CM

## 2020-06-27 DIAGNOSIS — J90 Pleural effusion, not elsewhere classified: Secondary | ICD-10-CM | POA: Diagnosis not present

## 2020-06-27 DIAGNOSIS — L0390 Cellulitis, unspecified: Secondary | ICD-10-CM

## 2020-06-27 DIAGNOSIS — Z01810 Encounter for preprocedural cardiovascular examination: Secondary | ICD-10-CM

## 2020-06-27 DIAGNOSIS — J869 Pyothorax without fistula: Secondary | ICD-10-CM | POA: Diagnosis not present

## 2020-06-27 HISTORY — PX: ABDOMINAL AORTOGRAM W/LOWER EXTREMITY: CATH118223

## 2020-06-27 LAB — BASIC METABOLIC PANEL
Anion gap: 10 (ref 5–15)
BUN: 6 mg/dL — ABNORMAL LOW (ref 8–23)
CO2: 26 mmol/L (ref 22–32)
Calcium: 8.7 mg/dL — ABNORMAL LOW (ref 8.9–10.3)
Chloride: 98 mmol/L (ref 98–111)
Creatinine, Ser: 0.44 mg/dL — ABNORMAL LOW (ref 0.61–1.24)
GFR, Estimated: >60 mL/min (ref >60)
Glucose, Bld: 94 mg/dL (ref 70–99)
Potassium: 4 mmol/L (ref 3.5–5.1)
Sodium: 134 mmol/L — ABNORMAL LOW (ref 135–145)

## 2020-06-27 LAB — HEMOGLOBIN A1C
Hgb A1c MFr Bld: 6.2 % — ABNORMAL HIGH (ref 4.8–5.6)
Mean Plasma Glucose: 131.24 mg/dL

## 2020-06-27 LAB — CBC
HCT: 27.1 % — ABNORMAL LOW (ref 39.0–52.0)
Hemoglobin: 8.2 g/dL — ABNORMAL LOW (ref 13.0–17.0)
MCH: 24 pg — ABNORMAL LOW (ref 26.0–34.0)
MCHC: 30.3 g/dL (ref 30.0–36.0)
MCV: 79.5 fL — ABNORMAL LOW (ref 80.0–100.0)
Platelets: 651 10*3/uL — ABNORMAL HIGH (ref 150–400)
RBC: 3.41 MIL/uL — ABNORMAL LOW (ref 4.22–5.81)
RDW: 15.9 % — ABNORMAL HIGH (ref 11.5–15.5)
WBC: 7.2 10*3/uL (ref 4.0–10.5)
nRBC: 0 % (ref 0.0–0.2)

## 2020-06-27 LAB — C-REACTIVE PROTEIN: CRP: 8.9 mg/dL — ABNORMAL HIGH (ref <1.0)

## 2020-06-27 SURGERY — ABDOMINAL AORTOGRAM W/LOWER EXTREMITY
Anesthesia: LOCAL | Laterality: Bilateral

## 2020-06-27 MED ORDER — SODIUM CHLORIDE 0.9 % IV SOLN: INTRAVENOUS | Status: DC

## 2020-06-27 MED ORDER — HEPARIN (PORCINE) IN NACL 1000-0.9 UT/500ML-% IV SOLN
INTRAVENOUS | Status: DC | PRN
  Administered 2020-06-27 (×2): 500 mL

## 2020-06-27 MED ORDER — IODIXANOL 320 MG/ML IV SOLN
INTRAVENOUS | Status: DC | PRN
  Administered 2020-06-27: 95 mL via INTRA_ARTERIAL

## 2020-06-27 MED ORDER — LIDOCAINE HCL (PF) 1 % IJ SOLN
INTRAMUSCULAR | Status: DC | PRN
  Administered 2020-06-27: 25 mL via INTRADERMAL

## 2020-06-27 MED ORDER — AMOXICILLIN-POT CLAVULANATE 875-125 MG PO TABS
1.0000 | ORAL_TABLET | Freq: Two times a day (BID) | ORAL | Status: DC
  Administered 2020-06-27 – 2020-06-28 (×3): 1 via ORAL
  Filled 2020-06-27 (×3): qty 1

## 2020-06-27 MED ORDER — PHENYLEPHRINE HCL-NACL 10-0.9 MG/250ML-% IV SOLN
INTRAVENOUS | Status: AC
  Filled 2020-06-27: qty 250

## 2020-06-27 SURGICAL SUPPLY — 10 items
CATH OMNI FLUSH 5F 65CM (CATHETERS) ×2 IMPLANT
DEVICE CLOSURE MYNXGRIP 5F (Vascular Products) ×2 IMPLANT
GLIDEWIRE ANGLED SS 035X260CM (WIRE) ×2 IMPLANT
KIT MICROPUNCTURE NIT STIFF (SHEATH) ×2 IMPLANT
KIT PV (KITS) ×2 IMPLANT
SHEATH PINNACLE 5F 10CM (SHEATH) ×2 IMPLANT
SHEATH PROBE COVER 6X72 (BAG) ×2 IMPLANT
SYR MEDRAD MARK V 150ML (SYRINGE) ×2 IMPLANT
TRANSDUCER W/STOPCOCK (MISCELLANEOUS) ×2 IMPLANT
TRAY PV CATH (CUSTOM PROCEDURE TRAY) ×2 IMPLANT

## 2020-06-27 NOTE — Consult Note (Addendum)
,  Cardiology Consultation:   Patient ID: Kirk Carrillo MRN: [id number]; DOB: [birth year]  Admit date: 06/20/2020 Date of Consult: 06/27/2020  Primary Care Provider: Oneita Hurt No [facility] Cardiologist: New [facility] Electrophysiologist:  None    Patient Profile:   Kirk Carrillo is a 59 y.o. male with a hx of PVD, hypertension, alcohol use, and tobacco use who is being seen today for the evaluation of cardiac pre-op evaluation at the request of Dr. [place].  History of Present Illness:   Kirk Carrillo has no significant cardiac history. He does have a history of PVD that was diagnosed about 3 years ago at the [place]. He takes aspirin daily. He was referred to a cardiologist about 6 months ago but never followed up on it. He has a history of tobacco use, smoked 1/2 ppd for 42 years. He drinks 4-5 beers about 10 days out of the month. No drug use. He works for an [employer] and is very active, always walking and climbing up and down stairs. No significant chest pain or sob with this. No significant family history of cardiac disease.  The patient present to [facility] ED 06/21/20 after being sent from a [facility] health urgent care clinic for ulcerations of the fourth and fifth toes. He reported increasing pain of the right foot for 1 week. Also reported progressive cough for the last 6 months with intermittent hemoptysis and unintentional weight loss (however denies decreased appetite). He was found to have sepsis due to pneumonia, right sided empyema, multiple cavitary lesions on the chest CT, leukocytosis, and anemia and admitted for further work-up. Vascular surgery was asked to see and ABIs showed moderate on the right and left. Angiography performed today and patient will need fem-pop bypass grafting. Cardiology was consulted for pre-op cardiac evaluation.    Past Medical History:  Diagnosis Date  . Hypertension   . Iron deficiency anemia   . Kidney stone   . Malnutrition (HCC)   . PVD  (peripheral vascular disease) (HCC)   . Tobacco abuse     History reviewed. No pertinent surgical history.   Home Medications:  Prior to Admission medications   Medication Sig Start Date End Date Taking? Authorizing Provider  diphenhydrAMINE (BENADRYL) 25 mg capsule Take 25 mg by mouth in the morning, at noon, and at bedtime.   Yes [provider]  guaifenesin (ROBITUSSIN) 100 MG/5ML syrup Take 200 mg by mouth 2 times daily at 12 noon and 4 pm.   Yes [provider]  hydrochlorothiazide (HYDRODIURIL) 25 MG tablet Take 1 tablet (25 mg total) by mouth daily. 08/06/12  Yes Earley Favor, NP  ibuprofen (ADVIL) 200 MG tablet Take 400 mg by mouth every 6 (six) hours as needed for moderate pain.   Yes [provider]  albuterol (PROAIR HFA) 108 (90 Base) MCG/ACT inhaler Inhale 1-2 puffs into the lungs every 6 (six) hours as needed for wheezing or shortness of breath. Patient not taking: Reported on 06/21/2020 02/15/20   Georgetta Haber, NP  benzonatate (TESSALON) 100 MG capsule Take 1-2 capsules (100-200 mg total) by mouth 3 (three) times daily as needed for cough. Patient not taking: Reported on 06/21/2020 02/15/20   Georgetta Haber, NP    Inpatient Medications: Scheduled Meds: . amoxicillin-clavulanate  1 tablet Oral Q12H  . benzonatate  100 mg Oral TID  . feeding supplement  237 mL Oral BID BM  . ferrous sulfate  325 mg Oral TID WC  . guaiFENesin  600 mg  Oral BID  . lidocaine  1 patch Transdermal Q24H  . multivitamin with minerals  1 tablet Oral Daily  . nicotine  14 mg Transdermal Daily  . vitamin B-12  1,000 mcg Oral Daily   Continuous Infusions: . sodium chloride Stopped (06/27/20 0625)  . sodium chloride 100 mL/hr at 06/27/20 0658   PRN Meds: sodium chloride, acetaminophen **OR** acetaminophen, albuterol, hydrALAZINE, HYDROcodone-acetaminophen, ondansetron **OR** ondansetron (ZOFRAN) IV, polyethylene glycol  Allergies:    Allergies  Allergen Reactions   . Amlodipine Swelling  . Telmisartan Other (See Comments)    Facial; angioedema.     Social History:   Social History   Socioeconomic History  . Marital status: Single    Spouse name: Not on file  . Number of children: Not on file  . Years of education: Not on file  . Highest education level: Not on file  Occupational History  . Not on file  Tobacco Use  . Smoking status: Current Every Day Smoker    Packs/day: 0.50  . Smokeless tobacco: Never Used  Vaping Use  . Vaping Use: Never used  Substance and Sexual Activity  . Alcohol use: Yes    Alcohol/week: 35.0 standard drinks    Types: 35 Cans of beer per week  . Drug use: No  . Sexual activity: Not on file  Other Topics Concern  . Not on file  Social History Narrative  . Not on file   Social Determinants of Health   Financial Resource Strain:   . Difficulty of Paying Living Expenses: Not on file  Food Insecurity:   . Worried About Programme researcher, broadcasting/film/video in the Last Year: Not on file  . Ran Out of Food in the Last Year: Not on file  Transportation Needs:   . Lack of Transportation (Medical): Not on file  . Lack of Transportation (Non-Medical): Not on file  Physical Activity:   . Days of Exercise per Week: Not on file  . Minutes of Exercise per Session: Not on file  Stress:   . Feeling of Stress : Not on file  Social Connections:   . Frequency of Communication with Friends and Family: Not on file  . Frequency of Social Gatherings with Friends and Family: Not on file  . Attends Religious Services: Not on file  . Active Member of Clubs or Organizations: Not on file  . Attends Banker Meetings: Not on file  . Marital Status: Not on file  Intimate Partner Violence:   . Fear of Current or Ex-Partner: Not on file  . Emotionally Abused: Not on file  . Physically Abused: Not on file  . Sexually Abused: Not on file    Family History:   Family History  Problem Relation Age of Onset  . Breast cancer Mother       ROS:  Please see the history of present illness.  All other ROS reviewed and negative.     Physical Exam/Data:   Vitals:   06/27/20 1005 06/27/20 1010 06/27/20 1015 06/27/20 1201  BP: (!) 142/82 125/90 (!) 152/87 129/86  Pulse: 75 79 75 75  Resp: (!) 7 (!) 9 18 18   Temp:    97.7 F (36.5 C)  TempSrc:    Oral  SpO2: 100% 100% (!) 0% 100%  Weight:      Height:        Intake/Output Summary (Last 24 hours) at 06/27/2020 1423 Last data filed at 06/27/2020 1352 Gross per 24 hour  Intake  1476.49 ml  Output 2160 ml  Net -683.51 ml   Last 3 Weights 06/27/2020 06/26/2020 06/25/2020  Weight (lbs) 121 lb 4.1 oz 112 lb 10.5 oz 124 lb 12.8 oz  Weight (kg) 55 kg 51.1 kg 56.609 kg     Body mass index is 18.71 kg/m.  General:  Well nourished, well developed, in no acute distress HEENT: normal Lymph: no adenopathy Neck: no JVD Endocrine:  No thryomegaly Vascular: No carotid bruits; decreased pulses lower extremity with ulcers right foot between 4-5  Cardiac:  normal S1, S2; RRR; no murmur  Lungs:  clear to auscultation bilaterally, no wheezing, rhonchi or rales  Abd: soft, nontender, no hepatomegaly  Ext: no edema Musculoskeletal:  No deformities, BUE and BLE strength normal and equal Skin: warm and dry  Neuro:  CNs 2-12 intact, no focal abnormalities noted Psych:  Normal affect   EKG:  The EKG was personally reviewed and demonstrates:  ST, 103bpm, QTc 47ams Telemetry:  Telemetry was personally reviewed and demonstrates:  NSR, HR 70s  Relevant CV Studies:  None  Laboratory Data:  High Sensitivity Troponin:  No results for input(s): TROPONINIHS in the last 720 hours.   Chemistry Recent Labs  Lab 06/25/20 0340 06/26/20 0217 06/27/20 0332  NA 131* 132* 134*  K 4.3 3.9 4.0  CL 98 97* 98  CO2 25 27 26   GLUCOSE 99 97 94  BUN 6* 5* 6*  CREATININE 0.37* 0.45* 0.44*  CALCIUM 8.5* 8.5* 8.7*  GFRNONAA >60 >60 >60  ANIONGAP 8 8 10     Recent Labs  Lab  06/20/20 2134 06/21/20 0500 06/22/20 0444  PROT 7.5 6.1* 5.9*  ALBUMIN 2.3* 1.8* 1.6*  AST 22 22 23   ALT 20 18 23   ALKPHOS 86 67 63  BILITOT 1.4* 0.7 0.2*   Hematology Recent Labs  Lab 06/25/20 0340 06/26/20 0217 06/27/20 0332  WBC 9.2 7.1 7.2  RBC 3.45* 3.42* 3.41*  HGB 8.3* 8.2* 8.2*  HCT 27.2* 27.2* 27.1*  MCV 78.8* 79.5* 79.5*  MCH 24.1* 24.0* 24.0*  MCHC 30.5 30.1 30.3  RDW 15.8* 15.9* 15.9*  PLT 548* 588* 651*   BNPNo results for input(s): BNP, PROBNP in the last 168 hours.  DDimer No results for input(s): DDIMER in the last 168 hours.   Radiology/Studies:  CT CHEST WO CONTRAST  Result Date: 06/27/2020 CLINICAL DATA:  Pain at chest tube site.  Cavitary pneumonia. EXAM: CT CHEST WITHOUT CONTRAST TECHNIQUE: Multidetector CT imaging of the chest was performed following the standard protocol without IV contrast. COMPARISON:  Chest x-ray from same day. CT chest dated June 20, 2020. FINDINGS: Cardiovascular: No significant vascular findings. Normal heart size. No pericardial effusion. No thoracic aortic aneurysm. Coronary, aortic arch, and branch vessel atherosclerotic vascular disease. Mediastinum/Nodes: Unchanged mediastinal and right hilar lymphadenopathy measuring up to 15 mm in short axis. New borderline enlarged right axillary lymph node measuring 1.0 cm in short axis. The thyroid gland, trachea, and esophagus demonstrate no significant findings. Lungs/Pleura: New posterior approach right pigtail chest tube within the now significantly decreased thick-walled empyema in the right posterior pleural space. The empyema measures up to 1.6 cm in maximal thickness, previously 5.2 cm. Foci of gas within the empyema likely related to drain flushings. There are a few other small areas of loculated, non-drainable pleural fluid along the lateral right upper lobe (series 3, image 42) and lateral right lower lobe (series 3, image 97) that are unchanged when compared to the prior study.  Scattered thick-walled cavitations  and peribronchial thickening throughout the right lung have improved. For example, the largest cavitary lesion in the anterior right lower lobe currently measures 3.3 x 3.0 cm (series 4, image 103), previously 5.7 x 5.1 cm. The previously measured right middle lobe nodule has also decreased in size and now demonstrates central cavitation, currently measuring 1.7 x 1.3 cm (series 4, image 85), previously 2.2 x 2.2 cm. Architectural distortion, traction bronchiectasis, and areas of spiculation in the right upper lobe are not significantly changed. The left lung remains clear. Unchanged left upper lobe calcified granuloma. No pneumothorax. Upper Abdomen: No acute abnormality. Musculoskeletal: No acute or significant osseous finding. Small volume subcutaneous emphysema in the right posterior chest wall. IMPRESSION: 1. Decreased right posterior empyema status post chest tube drainage. There are a few other small areas of loculated, non-drainable pleural fluid along the lateral right upper lobe and lateral right lower lobe that are unchanged. 2. Interval improvement in scattered thick-walled cavitary lesions and peribronchial thickening throughout the right lung, consistent with resolving necrotic pneumonia. 3. Unchanged architectural distortion, traction bronchiectasis, and areas of spiculation in the right upper lobe. While potentially related to current or prior infection, follow-up chest CT in 3-6 months is recommended to exclude underlying malignancy. 4. Unchanged mediastinal and right hilar lymphadenopathy, likely reactive. 5. Aortic Atherosclerosis (ICD10-I70.0). Electronically Signed   By: Obie Dredge M.D.   On: 06/27/2020 08:35   PERIPHERAL VASCULAR CATHETERIZATION  Result Date: 06/27/2020 DATE OF SERVICE: 06/27/2020  PATIENT:  [name]  59 y.o. male  PRE-OPERATIVE DIAGNOSIS:  PVD with right foot ulcer  POST-OPERATIVE DIAGNOSIS:  * No post-op diagnosis entered  *  PROCEDURE:  1) US guided LCFA access 2) Aortogram 3) RLE angiography with second order cannulation (95cc contrast)  SURGEON:  Surgeon(s) and Role:    * Leonie Douglas, MD - Primary  ASSISTANT: none  ANESTHESIA:   local  EBL: mine  BLOOD ADMINISTERED:none  DRAINS: none  LOCAL MEDICATIONS USED:  LIDOCAINE  SPECIMEN:  none  COUNTS: confirmed correct.  TOURNIQUET:  * No tourniquets in log *  PATIENT DISPOSITION:  PACU - hemodynamically stable.  Delay start of Pharmacological VTE agent (>24hrs) due to surgical blood loss or risk of bleeding: no  INDICATION FOR PROCEDURE: Jguadalupe Opiela is a 59 y.o. 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 After careful discussion of risks, benefits, and alternatives the patient was offered angiography. The patient understood and wished to proceed.  OPERATIVE FINDINGS: Aorta - no flow limiting disease Iliacs - diminutive vessels; no hemodynamically significant stenosis Femorals -    R CFA no hemodynamically significant stenosis    R PFA no hemodynamically significant stenosis    Severe stenosis proximal R SFA to proximal popliteal artery    L CFA >60% stenosis    L PFA no hemodynamically significant stenosis    L SFA CTO at proximal Popliteals    R popliteal reconstitutes above the knee, but appears diseased. Less disease behind and below the knee.    L popliteal reconstitutes above the knee, but appears diseased. Less disease behind and below the knee. Tibials    R AT / Peroneal runoff to the foot    L AT / PT / Peroneal runoff to the foot  Patient will need femoral-popliteal bypass grafting. Will request cardiac risk stratification. Vein mapping.  DESCRIPTION OF PROCEDURE: After identification of the patient in the pre-operative holding area, the patient was transferred to the  operating room. The patient was positioned supine on the operating room table. Anesthesia was induced. The groins was  prepped and draped in standard fashion. A surgical pause was performed confirming correct patient, procedure, and operative location.  To begin the left groin was anesthetized with 1% lidocaine. Using ultrasound guidance, the left common femoral artery was accessed with micropuncture technique. Fluoroscopy was used to confirm cannulation over the femoral head. Sheathogram was not performed. The 56F sheath was upsized to 6F.  An 035 Teena Dunk wire was advanced into the distal aorta. Over the wire an omni flush catheter was advanced to the level of L2. Aortogram was performed. See above for findings.  The right common iliac artery was selected with an 035 floppy angled glidewire. The wire was advanced into the superficial femoral artery. Over the wire the omni flush catheter was advanced into the external iliac artery. Selective right lower angiography was performed. See above for details.  I was not able to cross the proximal aspect of the SFA lesion. Because the lesion is TASC D, I elected to end the case here.  A mynx device was used to close the arteriotomy. Hemostasis was excellent upon completion.  Upon completion of the case instrument and sharps counts were confirmed correct. The patient was transferred to the PACU in good condition. I was present for all portions of the procedure.  Rande Brunt. Lenell Antu, MD [facility] Phone Number: [phone] 06/27/2020 10:13 AM  DG CHEST PORT 1 VIEW  Result Date: 06/27/2020 CLINICAL DATA:  59 year old male with empyema status post CT-guided chest tube placement, pleural fibrinolytic administration. EXAM: PORTABLE CHEST 1 VIEW COMPARISON:  Portable chest 06/26/2020 and earlier including chest CT 06/20/2020. FINDINGS: Portable AP semi upright view at 0538 hours. Right side chest tube remains in place. No pneumothorax identified. Mildly larger lung volumes. Stable cardiac size and mediastinal contours. Residual streaky right  perihilar and confluent right lung base opacity, possibly with some re-accumulation of pleural fluid compared to 06/25/2020. Stable left lung, tiny calcified left upper lobe granuloma. IMPRESSION: 1. Right chest tube remains in place with no pneumothorax. 2. Stable residual right lung opacity, possibly mild re-accumulation of pleural fluid since 06/25/2020. Electronically Signed   By: Odessa Fleming M.D.   On: 06/27/2020 06:07   DG Chest Port 1 View  Result Date: 06/26/2020 CLINICAL DATA:  Follow-up right chest tube. EXAM: PORTABLE CHEST 1 VIEW COMPARISON:  06/25/2020 FINDINGS: Right basilar chest tube is unchanged in position from previous exam. No pneumothorax identified. Small right loculated pleural effusion is unchanged from previous exam. Atelectasis and scarring within the right midlung and right lower lobe unchanged. Left lung is clear. IMPRESSION: 1. Stable position of right chest tube. No pneumothorax. 2. No change in small loculated right pleural effusion. Electronically Signed   By: Signa Kell M.D.   On: 06/26/2020 08:57   DG Chest Port 1 View  Result Date: 06/25/2020 CLINICAL DATA:  PleurX catheter placement EXAM: PORTABLE CHEST 1 VIEW COMPARISON:  June 22, 2020 FINDINGS: PleurX catheter tip is in the right lower lung region. No pneumothorax. Small loculated pleural effusion noted on the right. There is ill-defined airspace opacity in the right mid and lower lung zones, primarily due to scarring and atelectatic change. There is slight atelectasis in the medial left base. There is a small granuloma in the left upper lobe. Left lung otherwise clear. Heart size and pulmonary vascular normal. No adenopathy. There is aortic atherosclerosis. There are foci  of calcification in each carotid artery. No bone lesions. IMPRESSION: PleurX catheter with tip in right base region. No pneumothorax. Small loculated pleural effusion on the right. Areas of apparent scarring and atelectasis right mid and lower  lung regions. Mild atelectasis medial left base. Scattered granulomas on the left. No edema or airspace opacity. Stable cardiac silhouette. Aortic Atherosclerosis (ICD10-I70.0). Electronically Signed   By: Bretta BangWilliam  Woodruff III M.D.   On: 06/25/2020 08:19   [facility]LL CATH W/IMG GUIDE  Result Date: 06/24/2020 INDICATION: 59 year old with a loculated right pleural effusion and only a small amount of fluid could be removed during thoracentesis. Request for a large-bore chest tube. Patient also has cavitary lesions in the right lung. EXAM: CT-GUIDED RIGHT CHEST TUBE PLACEMENT MEDICATIONS: Moderate sedation ANESTHESIA/SEDATION: Fentanyl 50 mcg IV; Versed 1.5 mg IV Moderate Sedation Time:  23 minutes The patient was continuously monitored during the procedure by the interventional radiology nurse under my direct supervision. COMPLICATIONS: None immediate. PROCEDURE: Informed written consent was obtained from the patient after a thorough discussion of the procedural risks, benefits and alternatives. All questions were addressed. Maximal Sterile Barrier Technique was utilized including caps, mask, sterile gowns, sterile gloves, sterile drape, hand hygiene and skin antiseptic. A timeout was performed prior to the initiation of the procedure. Patient was placed prone. CT images through the chest were obtained. The loculated posterior right pleural effusion was identified and targeted. The right side of the back was prepped with chlorhexidine and sterile field was created. Skin was anesthetized using 1% lidocaine. Using CT guidance, a Yueh catheter was directed into the loculated effusion and a small amount of yellow fluid was aspirated. A J wire was advanced into the pleural space and the tract was dilated to accommodate a 16 JamaicaFrench Thal- Quick drain. Drain was advanced over the wire. Approximately 20 mL of cloudy grayish colored purulent fluid was aspirated. Drain was sutured in place and attached  to a chest drainage system. Minimal fluid was draining after placement to the suction. FINDINGS: Loculated posterior right pleural effusion. 16 French drain was successfully placed within the loculated effusion. Approximately 20 mL of purulent looking fluid was removed and findings are suggestive for an empyema. IMPRESSION: CT-guided placement of a chest tube within the right chest empyema. Fluid was sent for culture. Electronically Signed   By: Richarda OverlieAdam  Henn M.D.   On: 06/24/2020 17:22    Assessment and Plan:   Pre-operative cardiac evaluation - patient admitted with right foot pain/ulcers, right sided empyema s/p drain placement and multiple right-sided cavitary lesions being followed by IM and CCM. Vascular following for PVD and patient is requiring feb-pop bypass graft - Patient has no prior cardiac disease although has RF including HTN and tobacco use. Blood pressures relatively well controlled. Patient has not smoked since admission however unsure about definitely quitting.  - CT chest showed 3V CAD - check A1C and FLP to risk stratify - Functional status METS>4. Patient works for an [employer] and walks daily without chest pain or sob. - According to the revised cardiac risk index he is Class II risk at 6% for 30 day risk of death, MI, or cardiac arrest - Given 3V CAD would favor coronary CT to evaluate disease however given that he is asymptomatic might be able to wait until OP. Patient would also prefer OP since drain is going to be taken out and he wans to go home . Will check an echo. Needs aggressive primary prevention. MD to see  For questions or updates, please contact [facility] Please consult [email or web address] for contact info under    Signed, Rollene Rotunda, MD  06/27/2020 2:23 PM   History and all data above reviewed.  Patient examined.  I agree with the findings as above.  The patient has a no past cardiac history.  He came in because he had a cough and his wife thought he might  have Covid.  He ended up being found to have a cavitary pneumonia and empyema requiring chest tube drainage.  He also presented because he has an ulcer between his toes and on his fifth and fourth toes on his right foot.  He is found to have peripheral vascular disease.  He will need surgical revascularization.  Patient is not sure he wants to have it here and is wanting to be discharged with follow-up as an outpatient.  However, we were asked to provide preoperative clearance in this patient now with documented peripheral vascular disease.  He said he had no cardiac history although he says he did have a stress test at the [place] a few years ago.  He said he was told his heart was normal.  We will have these results.  He is active.  [place] and in that he has to climb up and down the stairs frequently.  He denies any chest pressure, neck or arm discomfort.  No shortness of breath, PND or orthopnea.  No palpitations, presyncope or syncope.  He has been told he has diabetes.  He does not know his lipids.  He does smoke cigarettes.  The patient exam reveals COR: Regular rate and rhythm, no rubs, no murmurs,  Lungs: Decreased breath sounds on the right, no wheezing, no crackles,  Abd: Positive bowel sounds, no rebound no guarding, Ext decreased dorsalis pedis and posterior tibialis bilaterally, ulceration on the lateral aspect of the right fourth toe and medial aspect of the right fifth toe.  All available labs, radiology testing, previous records reviewed. Agree with documented assessment and plan. PREOP: The patient has no high risk symptoms and physical findings other than his palpable vascular disease.  He has a very high functional level without any symptoms.  Given this, according to ACC/AHA guidelines.  No further testing is indicated preop.  The patient would be at acceptable risk for surgery.  PVD:  He needs risk reduction.  Lipids have been drawn.  I would suggest that he should be on a statin  unless his LDL is much lower than the goal of 70.  He clearly needs to stop smoking and we discussed this today.    Fayrene Fearing Trek Kimball  2:52 PM  06/27/2020

## 2020-06-27 NOTE — Progress Notes (Signed)
PROGRESS NOTE    Kirk Carrillo  ZOX:[id number] DOB: 1961-03-24 DOA: 06/20/2020 PCP: Pcp, No    Brief Narrative:  Kirk Carrillo is a 59 year old male with past medical history significant for or essential hypertension, tobacco use disorder, alcohol use without abuse who presented to the ED with right leg pain.  Pain has progressively gotten worse over the past several days and worse with weightbearing.  Patient has noticed that he has been developing ulcerations between the fourth and fifth toes.  Due to his symptoms, he presented to [facility] health urgent care clinic.  He was initially prescribed a 7-day course of doxycycline and had a podiatry referral placed; but after this was accomplished the urgent care provider recommend patient come to the emergency department for further evaluation.  Additionally, patient complains of worsening cough for the past 6 months.  Associated with foul-smelling sputum.  Also reports a 20 pound unintentional weight loss.  Denies any international travel, no close contact with anyone diagnosed with tuberculosis, no history of IV drug abuse or incarceration.  Denies fevers.  In the ED, x-ray right foot which revealed no evidence of osteomyelitis.  Elevated WBC count with associated tachycardia concerning for sepsis.  Chest x-ray revealing multiple right-sided pulmonary infiltrates.  CT chest with contrast with multiple cavitary lesions throughout the right lung with possible concurrent empyema.  Patient was started on empiric broad-spectrum antibiotics.  Hospitalist service was requested for further evaluation and management.   Assessment & Plan:   Principal Problem:   Sepsis due to pneumonia [facility]) Active Problems:   Pneumonia of right lung due to infectious organism   Essential hypertension   Empyema of right pleural space (HCC)   Alcohol abuse   Ulcer of toe, right, with fat layer exposed (HCC)   Nicotine dependence, cigarettes,  uncomplicated   Multiple right-sided pulmonary cavitations Right loculated pleural effusion/empyema Patient reports chronic cough productive of foul-smelling sputum past 6 months.  No recent travel, no IV drug use/incarceration, no contact with individuals diagnosed with TB.  CT chest with contrast notable for multiple areas of cavitation throughout the right lung, predominantly right upper lobe with several areas of the cavitation thick-walled and peripheral enhancing concerning for abscess.  Underwent IR guided right thoracentesis with 15 mL of clear yellow fluid obtained.  Underwent IR CT-guided chest tube placement on 06/24/2020. S/p TPA/dornase per PCCM to chest tybe 11/24 and 11/25. --PCCM/CTS/IR following, appreciate assistance --CT output past 24h --Follow-up chest x-ray this morning notable for possible slight increase in pleural fluid --CT chest 11/26 w/ decreased right posterior empyema with a few other small areas of loculated nondrainable pleural fluid, traction bronchiectasis/spiculation areas right upper lobe --QuantiFERON gold: Negative --MTB NAA antigen negative --AFB x 2 negative, 3rd specimen: Pending --Pleural fluid culture 06/22/2020: No growth x3 days --Pleural fluid culture 06/24/2020: No growth x 2 days --Flush chest tube twice daily with 50 cc normal saline --Airborne precautions discontinued per pulmonology 11/24 --Lidocaine patch to chest tube insertion site --Continue empiric antibiotics with vancomycin/meropenem, if cultures show no growth, pulmonology likely to transition to Unasyn followed by Augmentin to complete a 6-week antibiotic course --Will need follow-up chest CT 3-6 months to exclude underlying malignancy  Peripheral vascular disease Right foot toe ulcers Patient presenting to the ED with right foot pain with new ulcers to fourth/fifth toe.  Right foot x-ray with no acute bony abnormality.  Vascular ultrasound ABI with moderate right and left lower  extremity arterial disease --Vascular surgery plans arteriogram  with possible intervention today  Hypokalemia Hypomagnesemia Repleted earlier in the hospitalization.  Potassium 4.0 today. --Continue monitor electrolytes closely  Hyponatremia Etiology likely secondary to poor oral intake. --Sodium 134 today. --Continue to encourage increased oral intake, hydration.  Iron deficiency anemia Iron level 10, TIBC 221 (low), folate 15.8, B12 398. --Avoiding IV iron secondary to active infection/empyema --Ferrous sulfate 325 mg p.o. 3 times daily  B12 deficiency B12 398. --Cyanocobalamin 1000 mcg p.o. daily  Tobacco use disorder --Nicotine patch  Moderate protein calorie malnutrition Body mass index is 18.71 kg/m. Nutrition Status: Nutrition Problem: Increased nutrient needs Etiology: acute illness (rt pleural effusion) Signs/Symptoms: estimated needs Interventions: Ensure Enlive (each supplement provides 350kcal and 20 grams of protein), MVI --Discontinued Ensure as patient reporting diarrhea with use --Continue to encourage increased oral intake    DVT prophylaxis: SCDs Code Status: Full code Family Communication: No family present at bedside this morning  Disposition Plan:  Status is: Inpatient  Remains inpatient appropriate because:Ongoing active pain requiring inpatient pain management, Ongoing diagnostic testing needed not appropriate for outpatient work up, Unsafe d/c plan, IV treatments appropriate due to intensity of illness or inability to take PO and Inpatient level of care appropriate due to severity of illness   Dispo: The patient is from: Home              Anticipated d/c is to: Home              Anticipated d/c date is: > 3 days              Patient currently is not medically stable to d/c.   Consultants:   PCCM  Vascular surgery  Interventional radiology  CTS  Procedures:   Right thoracentesis, IR 06/22/2020  Right chest tube, IR  06/24/2020  Abdominal aortogram 11/26; Dr. Lenell Antu  Antimicrobials:   Vancomycin 11/19>>  Meropenem 11/20>>  Zosyn 11/19 - 11/20   Subjective: Patient seen and examined bedside, resting comfortably.  Continues with some pain at chest tube site, tolerable.  Waiting abdominal aortogram later this morning by vascular surgery.  Continues with output from chest tube with repeat chest x-ray shows possible interval increase in pleural fluid; although CT chest this morning also indicates decreased right posterior empyema. No other questions or concerns at this time. Denies headache, no visual changes, no chest pain, no palpitations, no abdominal pain, no weakness, no fatigue, no fever/chills/night sweats, no nausea/vomiting, no paresthesias.  No acute concerns overnight per nursing staff.  Objective: Vitals:   06/26/20 0338 06/26/20 2123 06/27/20 0526 06/27/20 0626  BP: (!) 100/54 138/84 (!) 96/58 119/70  Pulse: 67 76 69   Resp: Temp: 98.2 F (36.8 C) 98.4 F (36.9 C) 98.2 F (36.8 C)   TempSrc: Oral Oral Oral   SpO2: 99% 98% 97%   Weight:   55 kg   Height:        Intake/Output Summary (Last 24 hours) at 06/27/2020 0931 Last data filed at 06/27/2020 0846 Gross per 24 hour  Intake 1956.49 ml  Output 2160 ml  Net -203.51 ml   Filed Weights   06/25/20 0400 06/26/20 0050 06/27/20 0526  Weight: 56.6 kg 51.1 kg 55 kg    Examination:  General exam: Appears calm and comfortable  Respiratory system: Clear to auscultation. Respiratory effort normal.  Chest tube noted in place to suction Cardiovascular system: S1 & S2 heard, RRR. No JVD, murmurs, rubs, gallops or clicks. No pedal edema. Gastrointestinal system:  Abdomen is nondistended, soft and nontender. No organomegaly or masses felt. Normal bowel sounds heard. Central nervous system: Alert and oriented. No focal neurological deficits. Extremities: Symmetric 5 x 5 power. Skin: Right foot with dressing in  place Psychiatry: Judgement and insight appear normal. Mood & affect appropriate.     Data Reviewed: I have personally reviewed following labs and imaging studies  CBC: Recent Labs  Lab 06/20/20 2134 06/20/20 2134 06/21/20 0500 06/21/20 0500 06/22/20 0444 06/22/20 0444 06/23/20 0649 06/24/20 0347 06/25/20 0340 06/26/20 0217 06/27/20 0332  WBC 19.1*   < > 13.2*   < > 13.6*   < > 12.5* 11.3* 9.2 7.1 7.2  NEUTROABS 15.1*  --  10.1*  --  10.5*  --   --   --   --   --   --   HGB 9.6*   < > 8.7*   < > 8.1*   < > 8.4* 7.9* 8.3* 8.2* 8.2*  HCT 30.7*   < > 28.3*   < > 26.0*   < > 27.0* 25.4* 27.2* 27.2* 27.1*  MCV 79.5*   < > 78.6*   < > 77.6*   < > 78.0* 77.9* 78.8* 79.5* 79.5*  PLT 673*   < > 537*   < > 433*   < > 494* 515* 548* 588* 651*   < > = values in this interval not displayed.   Basic Metabolic Panel: Recent Labs  Lab 06/21/20 0500 06/21/20 0500 06/22/20 0444 06/23/20 0649 06/25/20 0340 06/26/20 0217 06/27/20 0332  NA 132*   < > 129* 132* 131* 132* 134*  K 3.1*   < > 3.2* 4.1 4.3 3.9 4.0  CL 97*   < > 97* 100 98 97* 98  CO2 25   < > 25 25 25 27 26   GLUCOSE 91   < > 109* 96 99 97 94  BUN 8   < > 5* 6* 6* 5* 6*  CREATININE 0.50*   < > 0.38* 0.37* 0.37* 0.45* 0.44*  CALCIUM 8.1*   < > 7.9* 8.1* 8.5* 8.5* 8.7*  MG 1.6*  --  1.7  --  1.9 1.9  --   PHOS 3.9  --   --   --   --   --   --    < > = values in this interval not displayed.   GFR: Estimated Creatinine Clearance: 75.4 mL/min (A) (by C-G formula based on SCr of 0.44 mg/dL (L)). Liver Function Tests: Recent Labs  Lab 06/20/20 2134 06/21/20 0500 06/22/20 0444  AST 22 22 23   ALT 20 18 23   ALKPHOS 86 67 63  BILITOT 1.4* 0.7 0.2*  PROT 7.5 6.1* 5.9*  ALBUMIN 2.3* 1.8* 1.6*   Recent Labs  Lab 06/20/20 2134  LIPASE 19   No results for input(s): AMMONIA in the last 168 hours. Coagulation Profile: Recent Labs  Lab 06/21/20 0500 06/24/20 0347  INR 1.1 1.1   Cardiac Enzymes: No results for  input(s): CKTOTAL, CKMB, CKMBINDEX, TROPONINI in the last 168 hours. BNP (last 3 results) No results for input(s): PROBNP in the last 8760 hours. HbA1C: No results for input(s): HGBA1C in the last 72 hours. CBG: No results for input(s): GLUCAP in the last 168 hours. Lipid Profile: No results for input(s): CHOL, HDL, LDLCALC, TRIG, CHOLHDL, LDLDIRECT in the last 72 hours. Thyroid Function Tests: No results for input(s): TSH, T4TOTAL, FREET4, T3FREE, THYROIDAB in the last 72 hours. Anemia Panel: No results for  input(s): VITAMINB12, FOLATE, FERRITIN, TIBC, IRON, RETICCTPCT in the last 72 hours. Sepsis Labs: Recent Labs  Lab 06/20/20 2147 06/20/20 2319 06/21/20 0500  PROCALCITON  --   --  0.15  LATICACIDVEN 1.0 0.8  --     Recent Results (from the past 240 hour(s))  Blood culture (routine x 2)     Status: None   Collection Time: 06/20/20  9:45 PM   Specimen: BLOOD LEFT ARM  Result Value Ref Range Status   Specimen Description BLOOD LEFT ARM  Final   Special Requests   Final    BOTTLES DRAWN AEROBIC AND ANAEROBIC Blood Culture adequate volume   Culture   Final    NO GROWTH 5 DAYS Performed at [facility], [address] [place], [place] [address]    Report Status 06/25/2020 FINAL  Final  Blood culture (routine x 2)     Status: None   Collection Time: 06/20/20  9:46 PM   Specimen: BLOOD RIGHT ARM  Result Value Ref Range Status   Specimen Description BLOOD RIGHT ARM  Final   Special Requests   Final    BOTTLES DRAWN AEROBIC AND ANAEROBIC Blood Culture adequate volume   Culture   Final    NO GROWTH 5 DAYS Performed at [facility], [address] [place], [place] [address]    Report Status 06/25/2020 FINAL  Final  Respiratory Panel by RT PCR (Flu A&B, Covid) - Nasopharyngeal Swab     Status: None   Collection Time: 06/20/20 10:48 PM   Specimen: Nasopharyngeal Swab; Nasopharyngeal(NP) swabs in vial transport medium  Result Value Ref Range Status   SARS  Coronavirus 2 by RT PCR NEGATIVE NEGATIVE Final    Comment: (NOTE) SARS-CoV-2 target nucleic acids are NOT DETECTED.  The SARS-CoV-2 RNA is generally detectable in upper respiratoy specimens during the acute phase of infection. The lowest concentration of SARS-CoV-2 viral copies this assay can detect is 131 copies/mL. A negative result does not preclude SARS-Cov-2 infection and should not be used as the sole basis for treatment or other patient management decisions. A negative result may occur with  improper specimen collection/handling, submission of specimen other than nasopharyngeal swab, presence of viral mutation(s) within the areas targeted by this assay, and inadequate number of viral copies (<131 copies/mL). A negative result must be combined with clinical observations, patient history, and epidemiological information. The expected result is Negative.  Fact Sheet for Patients:  https://www.moore.com/https://www.fda.gov/media/142436/download  Fact Sheet for Healthcare Providers:  https://www.young.biz/https://www.fda.gov/media/142435/download  This test is no t yet approved or cleared by the [place] FDA and  has been authorized for detection and/or diagnosis of SARS-CoV-2 by FDA under an Emergency Use Authorization (EUA). This EUA will remain  in effect (meaning this test can be used) for the duration of the COVID-19 declaration under Section 564(b)(1) of the Act, 21 U.S.C. section 360bbb-3(b)(1), unless the authorization is terminated or revoked sooner.     Influenza A by PCR NEGATIVE NEGATIVE Final   Influenza B by PCR NEGATIVE NEGATIVE Final    Comment: (NOTE) The Xpert Xpress SARS-CoV-2/FLU/RSV assay is intended as an aid in  the diagnosis of influenza from Nasopharyngeal swab specimens and  should not be used as a sole basis for treatment. Nasal washings and  aspirates are unacceptable for Xpert Xpress SARS-CoV-2/FLU/RSV  testing.  Fact Sheet for  Patients: https://www.moore.com/https://www.fda.gov/media/142436/download  Fact Sheet for Healthcare Providers: https://www.young.biz/https://www.fda.gov/media/142435/download  This test is not yet approved or cleared by the [place]  FDA and  has been authorized for detection and/or diagnosis of SARS-CoV-2 by  FDA under an Emergency Use Authorization (EUA). This EUA will remain  in effect (meaning this test can be used) for the duration of the  Covid-19 declaration under Section 564(b)(1) of the Act, 21  U.S.C. section 360bbb-3(b)(1), unless the authorization is  terminated or revoked. Performed at [facility], [address], [place], [place] [address]   Expectorated sputum assessment w rflx to resp cult     Status: None   Collection Time: 06/21/20  1:16 AM   Specimen: Expectorated Sputum  Result Value Ref Range Status   Specimen Description EXPECTORATED SPUTUM  Final   Special Requests NONE  Final   Sputum evaluation   Final    THIS SPECIMEN IS ACCEPTABLE FOR SPUTUM CULTURE Performed at [facility], [address], [place], [place] [address]    Report Status 06/21/2020 FINAL  Final  Culture, respiratory     Status: None   Collection Time: 06/21/20  1:16 AM  Result Value Ref Range Status   Specimen Description EXPECTORATED SPUTUM  Final   Special Requests NONE Reflexed from [id number]  Final   Gram Stain   Final    ABUNDANT WBC PRESENT, PREDOMINANTLY PMN RARE SQUAMOUS EPITHELIAL CELLS PRESENT MODERATE YEAST RARE GRAM POSITIVE COCCI RARE GRAM NEGATIVE RODS    Culture   Final    Normal respiratory flora-no Staph aureus or Pseudomonas seen Performed at [facility], [address], [place], [place] [address]    Report Status 06/23/2020 FINAL  Final  MTB RIF NAA w/o Culture, Sputum     Status: None   Collection Time: 06/21/20  3:55 AM   Specimen: Expectorated Sputum  Result Value Ref Range Status   M Tuberculosis Complex Comment NOT DETECTED Final    Comment: (NOTE) Mycobacterium tuberculosis  complex (MTBC) NOT detected. Per the College of American Pathologists (CAP) guidelines, a culture must be performed on all samples regardless of the molecular test result. By ordering this test code, the client has assumed responsibility for the performance of the culture.    Rifampin Comment Susceptible Final    Comment: (NOTE) Because Mycobacterium tuberculosis complex (MTBC) was not detected, no rifampin determination is possible.    AFB Specimen Processing Concentration  Final    Comment: (NOTE) Performed At: [facility] [address] [place], [place] [id number] Jolene Schimke MD OE:[id number]   Acid Fast Smear (AFB)     Status: None   Collection Time: 06/22/20  8:17 AM   Specimen: Sputum  Result Value Ref Range Status   AFB Specimen Processing Concentration  Final   Acid Fast Smear Negative  Final    Comment: (NOTE) Performed At: [facility] [address] [place], [place] [id number] Jolene Schimke MD CV:[id number]    Source (AFB) SPU  Final    Comment: Performed at [facility], [address], [place], [place] [address]  Body fluid culture     Status: None   Collection Time: 06/22/20 11:23 AM   Specimen: PATH Cytology Pleural fluid  Result Value Ref Range Status   Specimen Description PLEURAL FLUID  Final   Special Requests RIGHT  Final   Gram Stain   Final    ABUNDANT WBC PRESENT, PREDOMINANTLY PMN NO ORGANISMS SEEN    Culture   Final    NO GROWTH 3 DAYS Performed at [facility], [address], [place], [place] [address]  Report Status 06/25/2020 FINAL  Final  Acid Fast Smear (AFB)     Status: None   Collection Time: 06/22/20  4:27 PM   Specimen: Pleural; Respiratory  Result Value Ref Range Status   AFB Specimen Processing Concentration  Final   Acid Fast Smear Negative  Final    Comment: (NOTE) Performed At: [facility] [address] [place], [place] [id number] Jolene Schimke MD WU:[id number]    Source (AFB)  PLEURAL  Final    Comment: FLUID Performed at [facility], [address], [place], [place] [address]   Acid Fast Smear (AFB)     Status: None   Collection Time: 06/23/20  5:00 AM   Specimen: Sputum  Result Value Ref Range Status   AFB Specimen Processing Concentration  Final   Acid Fast Smear Negative  Final    Comment: (NOTE) Performed At: [facility] [address] [place], [place] [id number] Jolene Schimke MD NG:[id number]    Source (AFB) EXPECTORATED SPUTUM  Final    Comment: Performed at [facility], [address] [place], [place] [address]  Aerobic/Anaerobic Culture (surgical/deep wound)     Status: None (Preliminary result)   Collection Time: 06/24/20  5:23 PM   Specimen: Pleural Fluid  Result Value Ref Range Status   Specimen Description PLEURAL  Final   Special Requests NONE  Final   Gram Stain   Final    ABUNDANT WBC PRESENT, PREDOMINANTLY PMN NO ORGANISMS SEEN    Culture   Final    NO GROWTH 2 DAYS NO ANAEROBES ISOLATED; CULTURE IN PROGRESS FOR 5 DAYS Performed at [facility], [address], [place], [place] [address]    Report Status PENDING  Incomplete  Acid Fast Smear (AFB)     Status: None   Collection Time: 06/24/20  6:19 PM   Specimen: Sputum  Result Value Ref Range Status   AFB Specimen Processing Concentration  Final   Acid Fast Smear Negative  Final    Comment: (NOTE) Performed At: [facility] [address] [place], [place] [id number] Jolene Schimke MD VZ:[id number]    Source (AFB) EXPECTORATED SPUTUM  Final    Comment: Performed at [facility], [address] [place], [place] [address]         Radiology Studies: CT CHEST WO CONTRAST  Result Date: 06/27/2020 CLINICAL DATA:  Pain at chest tube site.  Cavitary pneumonia. EXAM: CT CHEST WITHOUT CONTRAST TECHNIQUE: Multidetector CT imaging of the chest was performed following the standard protocol without IV contrast. COMPARISON:  Chest x-ray  from same day. CT chest dated June 20, 2020. FINDINGS: Cardiovascular: No significant vascular findings. Normal heart size. No pericardial effusion. No thoracic aortic aneurysm. Coronary, aortic arch, and branch vessel atherosclerotic vascular disease. Mediastinum/Nodes: Unchanged mediastinal and right hilar lymphadenopathy measuring up to 15 mm in short axis. New borderline enlarged right axillary lymph node measuring 1.0 cm in short axis. The thyroid gland, trachea, and esophagus demonstrate no significant findings. Lungs/Pleura: New posterior approach right pigtail chest tube within the now significantly decreased thick-walled empyema in the right posterior pleural space. The empyema measures up to 1.6 cm in maximal thickness, previously 5.2 cm. Foci of gas within the empyema likely related to drain flushings. There are a few other small areas of loculated, non-drainable pleural fluid along the lateral right upper lobe (series 3, image 42) and lateral right lower lobe (series 3, image 97) that are unchanged when compared to the  prior study. Scattered thick-walled cavitations and peribronchial thickening throughout the right lung have improved. For example, the largest cavitary lesion in the anterior right lower lobe currently measures 3.3 x 3.0 cm (series 4, image 103), previously 5.7 x 5.1 cm. The previously measured right middle lobe nodule has also decreased in size and now demonstrates central cavitation, currently measuring 1.7 x 1.3 cm (series 4, image 85), previously 2.2 x 2.2 cm. Architectural distortion, traction bronchiectasis, and areas of spiculation in the right upper lobe are not significantly changed. The left lung remains clear. Unchanged left upper lobe calcified granuloma. No pneumothorax. Upper Abdomen: No acute abnormality. Musculoskeletal: No acute or significant osseous finding. Small volume subcutaneous emphysema in the right posterior chest wall. IMPRESSION: 1. Decreased right  posterior empyema status post chest tube drainage. There are a few other small areas of loculated, non-drainable pleural fluid along the lateral right upper lobe and lateral right lower lobe that are unchanged. 2. Interval improvement in scattered thick-walled cavitary lesions and peribronchial thickening throughout the right lung, consistent with resolving necrotic pneumonia. 3. Unchanged architectural distortion, traction bronchiectasis, and areas of spiculation in the right upper lobe. While potentially related to current or prior infection, follow-up chest CT in 3-6 months is recommended to exclude underlying malignancy. 4. Unchanged mediastinal and right hilar lymphadenopathy, likely reactive. 5. Aortic Atherosclerosis (ICD10-I70.0). Electronically Signed   By: Obie Dredge M.D.   On: 06/27/2020 08:35   DG CHEST PORT 1 VIEW  Result Date: 06/27/2020 CLINICAL DATA:  59 year old male with empyema status post CT-guided chest tube placement, pleural fibrinolytic administration. EXAM: PORTABLE CHEST 1 VIEW COMPARISON:  Portable chest 06/26/2020 and earlier including chest CT 06/20/2020. FINDINGS: Portable AP semi upright view at 0538 hours. Right side chest tube remains in place. No pneumothorax identified. Mildly larger lung volumes. Stable cardiac size and mediastinal contours. Residual streaky right perihilar and confluent right lung base opacity, possibly with some re-accumulation of pleural fluid compared to 06/25/2020. Stable left lung, tiny calcified left upper lobe granuloma. IMPRESSION: 1. Right chest tube remains in place with no pneumothorax. 2. Stable residual right lung opacity, possibly mild re-accumulation of pleural fluid since 06/25/2020. Electronically Signed   By: Odessa Fleming M.D.   On: 06/27/2020 06:07   DG Chest Port 1 View  Result Date: 06/26/2020 CLINICAL DATA:  Follow-up right chest tube. EXAM: PORTABLE CHEST 1 VIEW COMPARISON:  06/25/2020 FINDINGS: Right basilar chest tube is  unchanged in position from previous exam. No pneumothorax identified. Small right loculated pleural effusion is unchanged from previous exam. Atelectasis and scarring within the right midlung and right lower lobe unchanged. Left lung is clear. IMPRESSION: 1. Stable position of right chest tube. No pneumothorax. 2. No change in small loculated right pleural effusion. Electronically Signed   By: Signa Kell M.D.   On: 06/26/2020 08:57        Scheduled Meds: . [MAR Hold] benzonatate  100 mg Oral TID  . [MAR Hold] feeding supplement  237 mL Oral BID BM  . [MAR Hold] ferrous sulfate  325 mg Oral TID WC  . [MAR Hold] guaiFENesin  600 mg Oral BID  . [MAR Hold] lidocaine  1 patch Transdermal Q24H  . [MAR Hold] multivitamin with minerals  1 tablet Oral Daily  . [MAR Hold] nicotine  14 mg Transdermal Daily  . [MAR Hold] vitamin B-12  1,000 mcg Oral Daily   Continuous Infusions: . [MAR Hold] sodium chloride Stopped (06/27/20 0625)  . sodium chloride 100 mL/hr at  06/27/20 7893  . [MAR Hold] meropenem (MERREM) IV Stopped (06/27/20 0559)  . [MAR Hold] vancomycin Stopped (06/27/20 0423)     LOS: 6 days    Time spent: 38 minutes spent on chart review, discussion with nursing staff, consultants, updating family and interview/physical exam; more than 50% of that time was spent in counseling and/or coordination of care.    Alvira Philips [place], DO [facility] Available via Epic secure chat 7am-7pm After these hours, please refer to coverage provider listed on [email or web address] 06/27/2020, 9:31 AM

## 2020-06-27 NOTE — Op Note (Addendum)
DATE OF SERVICE: 06/27/2020  PATIENT:  Kirk Carrillo  59 y.o. male  PRE-OPERATIVE DIAGNOSIS:  PVD with right foot ulcer  POST-OPERATIVE DIAGNOSIS:  * No post-op diagnosis entered *  PROCEDURE:   1) US guided LCFA access 2) Aortogram 3) RLE angiography with second order cannulation (95cc contrast)  SURGEON:  Surgeon(s) and Role:    * Leonie Douglas, MD - Primary  ASSISTANT: none  ANESTHESIA:   local  EBL: mine  BLOOD ADMINISTERED:none  DRAINS: none   LOCAL MEDICATIONS USED:  LIDOCAINE   SPECIMEN:  none  COUNTS: confirmed correct.  TOURNIQUET:  * No tourniquets in log *  PATIENT DISPOSITION:  PACU - hemodynamically stable.   Delay start of Pharmacological VTE agent (>24hrs) due to surgical blood loss or risk of bleeding: no  INDICATION FOR PROCEDURE: Kirk Carrillo is a 59 y.o. 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 After careful discussion of risks, benefits, and alternatives the patient was offered angiography. The patient understood and wished to proceed.  OPERATIVE FINDINGS:  Aorta - no flow limiting disease Iliacs - diminutive vessels; no hemodynamically significant stenosis Femorals -     R CFA no hemodynamically significant stenosis    R PFA no hemodynamically significant stenosis    Severe stenosis proximal R SFA to proximal popliteal artery    L CFA >60% stenosis    L PFA no hemodynamically significant stenosis    L SFA CTO at proximal Popliteals    R popliteal reconstitutes above the knee, but appears diseased. Less disease behind and below the knee.    L popliteal reconstitutes above the knee, but appears diseased. Less disease behind and below the knee. Tibials    R AT / Peroneal runoff to the foot    L AT / PT / Peroneal runoff to the foot  Patient will need femoral-popliteal bypass grafting. Will request cardiac risk stratification. Vein mapping.  DESCRIPTION OF  PROCEDURE: After identification of the patient in the pre-operative holding area, the patient was transferred to the operating room. The patient was positioned supine on the operating room table. Anesthesia was induced. The groins was prepped and draped in standard fashion. A surgical pause was performed confirming correct patient, procedure, and operative location.  To begin the left groin was anesthetized with 1% lidocaine. Using ultrasound guidance, the left common femoral artery was accessed with micropuncture technique. Fluoroscopy was used to confirm cannulation over the femoral head. Sheathogram was not performed. The 56F sheath was upsized to 41F.   An 035 Teena Dunk wire was advanced into the distal aorta. Over the wire an omni flush catheter was advanced to the level of L2. Aortogram was performed. See above for findings.   The right common iliac artery was selected with an 035 floppy angled glidewire. The wire was advanced into the superficial femoral artery. Over the wire the omni flush catheter was advanced into the external iliac artery. Selective right lower angiography was performed. See above for details.  I was not able to cross the proximal aspect of the SFA lesion. Because the lesion is TASC D, I elected to end the case here.   A mynx device was used to close the arteriotomy. Hemostasis was excellent upon completion.  Upon completion of the case instrument and sharps counts were confirmed correct. The patient was transferred to the PACU in good condition. I was present for all portions of the procedure.  Rande Brunt. Lenell Antu, MD  [facility] Phone Number: [phone] 06/27/2020 10:13 AM

## 2020-06-27 NOTE — Progress Notes (Signed)
Bilateral lower extremity vein mapping completed. Refer to "CV Proc" under chart review to view preliminary results.  06/27/2020 2:52 PM Eula Fried., MHA, RVT, RDCS, RDMS

## 2020-06-27 NOTE — Progress Notes (Signed)
Patient's chest tube removed. Patient tolerated well. Dressing applied. Will continue to monitor.

## 2020-06-27 NOTE — Progress Notes (Addendum)
NAME:  Kirk Carrillo, MRN:  [id number], DOB:  01/08/1961, LOS: 6 ADMISSION DATE:  06/20/2020, CONSULTATION DATE:  06/21/2020 REFERRING MD:  Dr. Allena Katz, CHIEF COMPLAINT:  Loculated pleural effusion  Brief History   80 yoM with hx of tobacco abuse and HTN presenting with foot wound additionally reporting cough, previously productive, with recent weight loss, exertional dyspnea, and night sweats.  CXR found abnormal, CT chest showing multiple right sided pulmonary cavitations with loculated pleural effusion.    History of present illness   59 year old male with prior history of HTN and tobacco abuse who was sent [name] for evaluation of  right food wounds, between his right fourth and fifth toes, which developed over the last several weeks after recently wearing tight fitting shoes.  Found to have a leukocytosis and was unable to be seen by podiatry, therefore sent to ER for further evaluation.  While in ER, patient also complained of worsening cough over the last 6 months with productive sputum with associated 20 lb unintentional weight loss.    Patient has never seen a pulmonologist.  Smoked cigarettes for the last 42 years, up to 1.5 ppd at his heaviest, recently down to 0.5 ppd; no vaping history.  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  Reports history of exposures to chemicals at work and while he worked in [employer].  No pets or work around pets, hot tubs, or mold exposure. He is fully vaccinated against COVID 19.   He reports always having a "smoker's cough" but over the last several months had become productive with foul smelling sputum and occasional blood tinged but now has changed to a dry cough.  Additionally, he feels like he has developed exertional shortness of breath.  Denies fever, syncope, or leg edema  Has been having night sweats in which he has to change his clothes. Denies recent or international travel, IVDA, incarceration, close contacts  with diagnosed tuberculosis, or sick exposures.  Sometimes has post-tussive emesis but denies any dysphagia or choking episodes.  No recent bloody sputum or change in stools.  He does take three aspirin a day because he heard it was good for you.  Occasional ETOH use.   He was afebrile, but tachycardic and tachypneic.  Remains on room air with saturations > 94%.  Workup thus far with right foot xray without evidence of osteomyelitis.  Labs noted for Na 132, K 3.1, CL 97, normal lactic, WBC 13.2, Hgb 8.7, Platelets 537, Mag 1.6, low albumin and protein, PCT 0.15, UA with 20 ketones and high SG.  SARS 2/flu negative. CXR with patchy airspace disease throughout the right lung with small pleural effusion.  A chest CT was obtained which revealed multiple cavitary lesions throughout the right lung, predominately in the right upper lobe with associated areas of traction bronchiectasis; loculated areas of right pleural effusion concerning for empyema, overall concerning for a necrotic pneumonia with empyema versus underlying malignancy.  Cultures sent and empirically placed on meropenem and vancomycin and airborne precautions.  AFB and quantiferon pending.  He was admitted to [facility].  Pulmonary consulted for further recommendations.    Past Medical History  HTN, tobacco abuse, "PVD" per patient  Significant Hospital Events   11/20 Admitted [facility] 11/21  Consults:  Pulmonary 11/20  Procedures:   Significant Diagnostic Tests:  11/21 LDH 4k, gram stain negative, culture pending 11/19 R foot XR >> neg for acute process 11/19 CXR >>Volume loss with patchy  airspace disease throughout the right lung. Cannot exclude pneumonia. Small right pleural effusion.  11/19 CT chest w/ contrast >> 1. Multiple areas of cavitation with extensive reticular change and architectural distortion throughout the right lung, predominantly in the right upper lobe with associated areas of traction bronchiectasis. Several areas of  cavitation appear more thick-walled and peripherally enhancing containing a admixed fluid and foci of gas concerning for superinfection and or abscess formation.  Loculated areas of right pleural effusion visceral and parietal pleural thickening worrisome for empyema as well. While this appearance could reflect sequela of a necrotic pneumonia with empyema, underlying malignancy is not fully excluded. 2. Mediastinal and right hilar adenopathy, possibly reactive or metastatic. 3. Circumferential thickening and mucosal hyperemia of the distal esophagus, correlate for features of esophagitis. Consider further evaluation with direct visualization. 4. Small sliding-type hiatal hernia with distal esophageal thickening and mucosal hyperemia, correlate for features of reflux. 5. Three-vessel coronary artery atherosclerosis. 6. Aortic Atherosclerosis   Micro Data:  11/19 SARS 2/ flu >> neg 11/19 BCx2 >> 11/20 expectorated sputum >>  AFB >>  MTB RIF >>  Antimicrobials:  11/19 meropenem  11/19 zosyn 11/19 vancomycin >>  Interim history/subjective:  No acute discomfort plan to remove chest tube  Objective   Blood pressure 119/70, pulse 69, temperature 98.2 F (36.8 C), temperature source Oral, resp. rate 20, height 5' 7.5" (1.715 m), weight 55 kg, SpO2 100 %.        Intake/Output Summary (Last 24 hours) at 06/27/2020 1008 Last data filed at 06/27/2020 0846 Gross per 24 hour  Intake 1956.49 ml  Output 2160 ml  Net -203.51 ml   Filed Weights   06/25/20 0400 06/26/20 0050 06/27/20 0526  Weight: 56.6 kg 51.1 kg 55 kg   Examination: seen in airborne precautions General: Cachectic male no acute distress just returned from CT scan Neuro: Grossly intact without focal defect CV: Heart sounds are regular regular rate rhythm PULM: Diminished breath sounds chest tube is in place with scant drainage GI: Positive bowel sounds Extremities: Ischemic changes to the foot and toe Skin: no rash     Resolved Hospital Problem list    Assessment & Plan:   Right-sided empyema status post 516 French drain placement per interventional radiology 06/24/2020 total of 1750 cc drainage up to 06/27/2020 Monitor culture drainage Remove chest tube once he returns from cath lab No further lytics May need CVTS consult in the future CT of the chest 1126 reveals decreased empyema Likely need follow-up CT Continue augmentin x 6 weeks FU with pulmonary 6 weeks. Elisha HeadlandBrian Mack NP January 7 th, 2022 0900 hrs has been made and added to discharge orders  Peripheral vascular disease with ulcers right foot Per vascular surgery  Alcohol tobacco abuse Per primary    Best practice:  Per roimary  Labs   CBC: Recent Labs  Lab 06/20/20 2134 06/20/20 2134 06/21/20 0500 06/21/20 0500 06/22/20 0444 06/22/20 0444 06/23/20 0649 06/24/20 0347 06/25/20 0340 06/26/20 0217 06/27/20 0332  WBC 19.1*   < > 13.2*   < > 13.6*   < > 12.5* 11.3* 9.2 7.1 7.2  NEUTROABS 15.1*  --  10.1*  --  10.5*  --   --   --   --   --   --   HGB 9.6*   < > 8.7*   < > 8.1*   < > 8.4* 7.9* 8.3* 8.2* 8.2*  HCT 30.7*   < > 28.3*   < > 26.0*   < >  27.0* 25.4* 27.2* 27.2* 27.1*  MCV 79.5*   < > 78.6*   < > 77.6*   < > 78.0* 77.9* 78.8* 79.5* 79.5*  PLT 673*   < > 537*   < > 433*   < > 494* 515* 548* 588* 651*   < > = values in this interval not displayed.    Basic Metabolic Panel: Recent Labs  Lab 06/21/20 0500 06/21/20 0500 06/22/20 0444 06/23/20 0649 06/25/20 0340 06/26/20 0217 06/27/20 0332  NA 132*   < > 129* 132* 131* 132* 134*  K 3.1*   < > 3.2* 4.1 4.3 3.9 4.0  CL 97*   < > 97* 100 98 97* 98  CO2 25   < > 25 25 25 27 26   GLUCOSE 91   < > 109* 96 99 97 94  BUN 8   < > 5* 6* 6* 5* 6*  CREATININE 0.50*   < > 0.38* 0.37* 0.37* 0.45* 0.44*  CALCIUM 8.1*   < > 7.9* 8.1* 8.5* 8.5* 8.7*  MG 1.6*  --  1.7  --  1.9 1.9  --   PHOS 3.9  --   --   --   --   --   --    < > = values in this interval not displayed.    GFR: Estimated Creatinine Clearance: 75.4 mL/min (A) (by C-G formula based on SCr of 0.44 mg/dL (L)). Recent Labs  Lab 06/20/20 2134 06/20/20 2147 06/20/20 2319 06/21/20 0500 06/22/20 0444 06/24/20 0347 06/25/20 0340 06/26/20 0217 06/27/20 0332  PROCALCITON  --   --   --  0.15  --   --   --   --   --   WBC   < >  --   --  13.2*   < > 11.3* 9.2 7.1 7.2  LATICACIDVEN  --  1.0 0.8  --   --   --   --   --   --    < > = values in this interval not displayed.    Liver Function Tests: Recent Labs  Lab 06/20/20 2134 06/21/20 0500 06/22/20 0444  AST 22 22 23   ALT 20 18 23   ALKPHOS 86 67 63  BILITOT 1.4* 0.7 0.2*  PROT 7.5 6.1* 5.9*  ALBUMIN 2.3* 1.8* 1.6*   Recent Labs  Lab 06/20/20 2134  LIPASE 19   No results for input(s): AMMONIA in the last 168 hours.  ABG No results found for: PHART, PCO2ART, PO2ART, HCO3, TCO2, ACIDBASEDEF, O2SAT   Coagulation Profile: Recent Labs  Lab 06/21/20 0500 06/24/20 0347  INR 1.1 1.1    Cardiac Enzymes: No results for input(s): CKTOTAL, CKMB, CKMBINDEX, TROPONINI in the last 168 hours.  HbA1C: No results found for: HGBA1C  CBG: No results for input(s): GLUCAP in the last 168 hours.    2135 Minor ACNP Acute Care Nurse Practitioner 06/23/20 Pulmonary/Critical Care Please consult Amion 06/27/2020, 10:08 AM    PCCM:   59 yo right empyema, loculated effusion s/p drain placement and lytics.   CT chest reviewed from this morning with resolution of loculated space and near resolution of the pleural effusion. Minimal residual left   BP 129/86 (BP Location: Left Arm)   Pulse 75   Temp 97.7 F (36.5 C) (Oral)   Resp 18   Ht 5' 7.5" (1.715 m)   Wt 55 kg   SpO2 100%   BMI 18.71 kg/m   Gen: male, resting in bed  Heart: RRR, s1 s2 Lungs: CTAB  Abd: soft nt nd   Labs reviewed   A:  Right empyema/parapneumonic effusion, loculated  Necrotic pna  PAD  P: Pull chest tube  Complete 6 weeks of augmentin  Repeat  cxr in 6 weeks  Can follow up in pulmonary clinic in 6 weeks.   Josephine Igo, DO [facility] 06/27/2020 12:50 PM

## 2020-06-27 NOTE — Progress Notes (Signed)
PT Cancellation Note  Patient Details Name: Kirk Carrillo MRN: [id number] DOB: 03/11/1961   Cancelled Treatment:    Reason Eval/Treat Not Completed: Other (comment).  Has just had angiography RLE with cannulation.  Will hold PT given the vasc procedure, retry at another time.   Ivar Drape 06/27/2020, 1:42 PM   Samul Dada, PT MS Acute Rehab Dept. Number: [facility] [id number] and [facility] [phone]

## 2020-06-27 NOTE — Progress Notes (Signed)
[address]       [name] [address]             [phone]      Day of Surgery Procedure(s) (LRB): ABDOMINAL AORTOGRAM W/LOWER EXTREMITY (Bilateral) Subjective: Tube removed today per PCCM  Objective: Vital signs in last 24 hours: Temp:  [98.2 F (36.8 C)-98.4 F (36.9 C)] 98.2 F (36.8 C) (11/26 0526) Pulse Rate:  [69-76] 69 (11/26 0526) Cardiac Rhythm: Normal sinus rhythm (11/25 1900) Resp:  [14-20] 20 (11/26 0526) BP: (96-138)/(58-84) 119/70 (11/26 0626) SpO2:  [97 %-98 %] 97 % (11/26 0526) Weight:  [55 kg] 55 kg (11/26 0526)  Hemodynamic parameters for last 24 hours:    Intake/Output from previous day: 11/25 0701 - 11/26 0700 In: 2306.5 [P.O.:1275; I.V.:231.4; IV Piggyback:800.1] Out: 1705 [Urine:1575; Chest Tube:130] Intake/Output this shift: No intake/output data recorded.   Lab Results: Recent Labs    06/26/20 0217 06/27/20 0332  WBC 7.1 7.2  HGB 8.2* 8.2*  HCT 27.2* 27.1*  PLT 588* 651*   BMET:  Recent Labs    06/26/20 0217 06/27/20 0332  NA 132* 134*  K 3.9 4.0  CL 97* 98  CO2 27 26  GLUCOSE 97 94  BUN 5* 6*  CREATININE 0.45* 0.44*  CALCIUM 8.5* 8.7*    PT/INR: No results for input(s): LABPROT, INR in the last 72 hours. ABG No results found for: PHART, HCO3, TCO2, ACIDBASEDEF, O2SAT CBG (last 3)  No results for input(s): GLUCAP in the last 72 hours.  Meds Scheduled Meds: . alteplase (TPA) for intrapleural administration  10 mg Intrapleural Daily   And  . pulmozyme (DORNASE) for intrapleural administration  5 mg Intrapleural Q24H  . benzonatate  100 mg Oral TID  . feeding supplement  237 mL Oral BID BM  . ferrous sulfate  325 mg Oral TID WC  . guaiFENesin  600 mg Oral BID  . lidocaine  1 patch Transdermal Q24H  . multivitamin with minerals  1 tablet Oral Daily  . nicotine  14 mg Transdermal Daily  . vitamin B-12  1,000 mcg Oral Daily   Continuous Infusions: . sodium chloride Stopped (06/27/20 0625)  .  sodium chloride 100 mL/hr at 06/27/20 0658  . meropenem (MERREM) IV Stopped (06/27/20 0559)  . vancomycin Stopped (06/27/20 0423)   PRN Meds:.sodium chloride, acetaminophen **OR** acetaminophen, albuterol, hydrALAZINE, HYDROcodone-acetaminophen, ondansetron **OR** ondansetron (ZOFRAN) IV, polyethylene glycol  Xrays DG CHEST PORT 1 VIEW  Result Date: 06/27/2020 CLINICAL DATA:  59 year old male with empyema status post CT-guided chest tube placement, pleural fibrinolytic administration. EXAM: PORTABLE CHEST 1 VIEW COMPARISON:  Portable chest 06/26/2020 and earlier including chest CT 06/20/2020. FINDINGS: Portable AP semi upright view at 0538 hours. Right side chest tube remains in place. No pneumothorax identified. Mildly larger lung volumes. Stable cardiac size and mediastinal contours. Residual streaky right perihilar and confluent right lung base opacity, possibly with some re-accumulation of pleural fluid compared to 06/25/2020. Stable left lung, tiny calcified left upper lobe granuloma. IMPRESSION: 1. Right chest tube remains in place with no pneumothorax. 2. Stable residual right lung opacity, possibly mild re-accumulation of pleural fluid since 06/25/2020. Electronically Signed   By: Odessa Fleming M.D.   On: 06/27/2020 06:07   DG Chest Port 1 View  Result Date: 06/26/2020 CLINICAL DATA:  Follow-up right chest tube. EXAM: PORTABLE CHEST 1 VIEW COMPARISON:  06/25/2020 FINDINGS: Right basilar chest tube is unchanged in position from previous exam. No pneumothorax identified.  Small right loculated pleural effusion is unchanged from previous exam. Atelectasis and scarring within the right midlung and right lower lobe unchanged. Left lung is clear. IMPRESSION: 1. Stable position of right chest tube. No pneumothorax. 2. No change in small loculated right pleural effusion. Electronically Signed   By: Signa Kell M.D.   On: 06/26/2020 08:57   DG Chest Port 1 View  Result Date: 06/25/2020 CLINICAL DATA:   PleurX catheter placement EXAM: PORTABLE CHEST 1 VIEW COMPARISON:  June 22, 2020 FINDINGS: PleurX catheter tip is in the right lower lung region. No pneumothorax. Small loculated pleural effusion noted on the right. There is ill-defined airspace opacity in the right mid and lower lung zones, primarily due to scarring and atelectatic change. There is slight atelectasis in the medial left base. There is a small granuloma in the left upper lobe. Left lung otherwise clear. Heart size and pulmonary vascular normal. No adenopathy. There is aortic atherosclerosis. There are foci of calcification in each carotid artery. No bone lesions. IMPRESSION: PleurX catheter with tip in right base region. No pneumothorax. Small loculated pleural effusion on the right. Areas of apparent scarring and atelectasis right mid and lower lung regions. Mild atelectasis medial left base. Scattered granulomas on the left. No edema or airspace opacity. Stable cardiac silhouette. Aortic Atherosclerosis (ICD10-I70.0). Electronically Signed   By: Bretta Bang III M.D.   On: 06/25/2020 08:19    Assessment/Plan: S/P Procedure(s) (LRB): ABDOMINAL AORTOGRAM W/LOWER EXTREMITY (Bilateral)  1 afeb, VSS 2 sats good on RA 3 chest tube has been removed per pulm medicine and they are managing care, we will follow from a distance, re- consult if further CT surgery issues develop.    LOS: 6 days    Rowe Clack PA-C Pager [phone] 06/27/2020

## 2020-06-28 DIAGNOSIS — I739 Peripheral vascular disease, unspecified: Secondary | ICD-10-CM | POA: Diagnosis present

## 2020-06-28 LAB — LIPID PANEL
Cholesterol: 91 mg/dL (ref 0–200)
HDL: 18 mg/dL — ABNORMAL LOW (ref >40)
LDL Cholesterol: 50 mg/dL (ref 0–99)
Total CHOL/HDL Ratio: 5.1 RATIO
Triglycerides: 114 mg/dL (ref <150)
VLDL: 23 mg/dL (ref 0–40)

## 2020-06-28 LAB — BASIC METABOLIC PANEL
Anion gap: 8 (ref 5–15)
BUN: 7 mg/dL — ABNORMAL LOW (ref 8–23)
CO2: 26 mmol/L (ref 22–32)
Calcium: 8.4 mg/dL — ABNORMAL LOW (ref 8.9–10.3)
Chloride: 100 mmol/L (ref 98–111)
Creatinine, Ser: 0.4 mg/dL — ABNORMAL LOW (ref 0.61–1.24)
GFR, Estimated: >60 mL/min (ref >60)
Glucose, Bld: 95 mg/dL (ref 70–99)
Potassium: 3.8 mmol/L (ref 3.5–5.1)
Sodium: 134 mmol/L — ABNORMAL LOW (ref 135–145)

## 2020-06-28 LAB — C-REACTIVE PROTEIN: CRP: 6 mg/dL — ABNORMAL HIGH (ref <1.0)

## 2020-06-28 LAB — CBC
HCT: 27.1 % — ABNORMAL LOW (ref 39.0–52.0)
Hemoglobin: 8.2 g/dL — ABNORMAL LOW (ref 13.0–17.0)
MCH: 23.6 pg — ABNORMAL LOW (ref 26.0–34.0)
MCHC: 30.3 g/dL (ref 30.0–36.0)
MCV: 78.1 fL — ABNORMAL LOW (ref 80.0–100.0)
Platelets: 646 10*3/uL — ABNORMAL HIGH (ref 150–400)
RBC: 3.47 MIL/uL — ABNORMAL LOW (ref 4.22–5.81)
RDW: 15.9 % — ABNORMAL HIGH (ref 11.5–15.5)
WBC: 5.4 10*3/uL (ref 4.0–10.5)
nRBC: 0 % (ref 0.0–0.2)

## 2020-06-28 MED ORDER — "AQUACEL AG ADVANTAGE 4""X5"" EX PADS": 1.0000 "application " | MEDICATED_PAD | Freq: Every day | CUTANEOUS | 0 refills | Status: AC

## 2020-06-28 MED ORDER — FERROUS SULFATE 325 (65 FE) MG PO TABS: 325.0000 mg | ORAL_TABLET | Freq: Three times a day (TID) | ORAL | 0 refills | Status: AC

## 2020-06-28 MED ORDER — ALBUTEROL SULFATE HFA 108 (90 BASE) MCG/ACT IN AERS
1.0000 | INHALATION_SPRAY | Freq: Four times a day (QID) | RESPIRATORY_TRACT | 0 refills | Status: AC | PRN
Start: 2020-06-28 — End: ?

## 2020-06-28 MED ORDER — CYANOCOBALAMIN 1000 MCG PO TABS: 1000.0000 ug | ORAL_TABLET | Freq: Every day | ORAL | 0 refills | Status: AC

## 2020-06-28 MED ORDER — AMOXICILLIN-POT CLAVULANATE 875-125 MG PO TABS: 1.0000 | ORAL_TABLET | Freq: Two times a day (BID) | ORAL | 0 refills | Status: AC

## 2020-06-28 MED ORDER — ASPIRIN EC 81 MG PO TBEC: 81.0000 mg | DELAYED_RELEASE_TABLET | Freq: Every day | ORAL | 0 refills | Status: AC

## 2020-06-28 NOTE — Progress Notes (Addendum)
Pt has requested for bed alarm to be turned off. Pt was educated on bed alarm safety and the bed alarm has been turned off.

## 2020-06-28 NOTE — Discharge Instructions (Signed)
Pleural Effusion Pleural effusion is an abnormal buildup of fluid in the layers of tissue between the lungs and the inside of the chest (pleural space) The two layers of tissue that line the lungs and the inside of the chest are called pleura. Usually, there is no air in the space between the pleura, only a thin layer of fluid. Some conditions can cause a large amount of fluid to build up, which can cause the lung to collapse if untreated. A pleural effusion is usually caused by another disease that requires treatment. What are the causes? Pleural effusion can be caused by:  Heart failure.  Certain infections, such as pneumonia or tuberculosis.  Cancer.  A blood clot in the lung (pulmonary embolism).  Complications from surgery, such as from open heart surgery.  Liver disease (cirrhosis).  Kidney disease. What are the signs or symptoms? In some cases, pleural effusion may cause no symptoms. If symptoms are present, they may include:  Shortness of breath, especially when lying down.  Chest pain. This may get worse when taking a deep breath.  Fever.  Dry, long-lasting (chronic) cough.  Hiccups.  Rapid breathing. An underlying condition that is causing the pleural effusion (such as heart failure, pneumonia, blood clots, tuberculosis, or cancer) may also cause other symptoms. How is this diagnosed? This condition may be diagnosed based on:  Your symptoms and medical history.  A physical exam.  A chest X-ray.  A procedure to use a needle to remove fluid from the pleural space (thoracentesis). This fluid is tested.  Other imaging studies of the chest, such as ultrasound or CT scan. How is this treated? Depending on the cause of your condition, treatment may include:  Treating the underlying condition that is causing the effusion. When that condition improves, the effusion will also improve. Examples of treatment for underlying conditions include: ? Antibiotic medicines to  treat an infection. ? Diuretics or other heart medicines to treat heart failure.  Thoracentesis.  Placing a thin flexible tube under your skin and into your chest to continuously drain the effusion (indwelling pleural catheter).  Surgery to remove the outer layer of tissue from the pleural space (decortication).  A procedure to put medicine into the chest cavity to seal the pleural space and prevent fluid buildup (pleurodesis).  Chemotherapy and radiation therapy, if you have cancerous (malignant) pleural effusion. These treatments are typically used to treat cancer. They kill certain cells in the body. Follow these instructions at home:  Take over-the-counter and prescription medicines only as told by your health care provider.  Ask your health care provider what activities are safe for you.  Keep track of how long you are able to do mild exercise (such as walking) before you get short of breath. Write down this information to share with your health care provider. Your ability to exercise should improve over time.  Do not use any products that contain nicotine or tobacco, such as cigarettes and e-cigarettes. If you need help quitting, ask your health care provider.  Keep all follow-up visits as told by your health care provider. This is important. Contact a health care provider if:  The amount of time that you are able to do mild exercise: ? Decreases. ? Does not improve with time.  You have a fever. Get help right away if:  You are short of breath.  You develop chest pain.  You develop a new cough. Summary P Peripheral Vascular Disease Peripheral vascular disease (PVD) is a disease of  the blood vessels. A simple term for PVD is poor circulation. In most cases, PVD narrows the blood vessels that carry blood from your heart to the rest of your body. This can result in a decreased supply of blood to your arms, legs, and internal organs, like your stomach or kidneys. However, it  most often affects a persons lower legs and feet. There are two types of PVD. Organic PVD. This is the more common type. It is caused by damage to the structure of blood vessels. Functional PVD. This is caused by conditions that make blood vessels contract and tighten (spasm). Without treatment, PVD tends to get worse over time. PVD can also lead to acute limb ischemia. This is when an arm or leg suddenly has trouble getting enough blood. This is a medical emergency. What are the causes?  Each type of PVD has many different causes. The most common cause of PVD is buildup of a fatty material (plaque) inside your arteries (atherosclerosis). Small amounts of plaque can break off from the walls of the blood vessels and become lodged in a smaller artery. This blocks blood flow and can cause acute limb ischemia. Other common causes of PVD include: Blood clots that form inside of blood vessels. Injuries to blood vessels. Diseases that cause inflammation of blood vessels or cause blood vessel spasms. Health behaviors and health history that increase your risk of developing PVD. What increases the risk? You are more likely to develop this condition if: You have a family history of PVD. You have certain medical conditions, including: High cholesterol. Diabetes. High blood pressure (hypertension). Coronary heart disease. Past problems with blood clots. Past injury, such as burns or a broken bone. These may have damaged blood vessels in your limbs. Buerger disease. This is caused by inflamed blood vessels in your hands and feet. Some forms of arthritis. Rare birth defects that affect the arteries in your legs. Kidney disease. You use tobacco or smoke. You do not get enough exercise. You are obese. You are age 74 or older. What are the signs or symptoms? This condition may cause different symptoms. Your symptoms depend on what part of your body is not getting enough blood. Some common signs and  symptoms include: Cramps in your lower legs. This may be a symptom of poor leg circulation (claudication). Pain and weakness in your legs. This happens while you are physically active but goes away when you rest (intermittent claudication). Leg pain when at rest. Leg numbness, tingling, or weakness. Coldness in a leg or foot, especially when compared with the other leg. Skin or hair changes. These can include: Hair loss. Shiny skin. Pale or bluish skin. Thick toenails. Inability to get or maintain an erection (erectile dysfunction). Fatigue. People with PVD are more likely to develop ulcers and sores on their toes, feet, or legs. These may take longer than normal to heal. How is this diagnosed? This condition is diagnosed based on: Your signs and symptoms. A physical exam and your medical history. Other tests to find out what is causing your PVD and to determine its severity. Tests may include: Blood pressure recordings from your arms and legs and measurements of the strength of your pulses (pulse volume recordings). Imaging studies using sound waves to take pictures of the blood flow through your blood vessels (Doppler ultrasound). Injecting a dye into your blood vessels before having imaging studies using: X-rays (angiogram or arteriogram). Computer-generated X-rays (CT angiogram). A powerful electromagnetic field and a computer (magnetic resonance  angiogram or MRA). How is this treated? Treatment for PVD depends on the cause of your condition and how severe your symptoms are. It also depends on your age. Underlying causes need to be treated and controlled. These include long-term (chronic) conditions, such as diabetes, high cholesterol, and high blood pressure. Treatment includes: Lifestyle changes, such as: Quitting smoking. Exercising regularly. Following a low-fat, low-cholesterol diet. Taking medicines, such as: Blood thinners to prevent blood clots. Medicines to improve blood  flow. Medicines to improve your blood cholesterol levels. Surgical procedures, such as: A procedure that uses an inflated balloon to open a blocked artery and improve blood flow (angioplasty). A procedure to put in a wire mesh tube to keep a blocked artery open (stent implant). Surgery to reroute blood flow around a blocked artery (peripheral bypass surgery). Surgery to remove dead tissue from an infected wound on the affected limb. Amputation. This is surgical removal of the affected limb. It may be necessary in cases of acute limb ischemia where there has been no improvement through medical or surgical treatments. Follow these instructions at home: Lifestyle Do not use any products that contain nicotine or tobacco, such as cigarettes and e-cigarettes. If you need help quitting, ask your health care provider. Lose weight if you are overweight, and maintain a healthy weight as discussed by your health care provider. Eat a diet that is low in fat and cholesterol. If you need help, ask your health care provider. Exercise regularly. Ask your health care provider to suggest some good activities for you. General instructions Take over-the-counter and prescription medicines only as told by your health care provider. Take good care of your feet: Wear comfortable shoes that fit well. Check your feet often for any cuts or sores. Keep all follow-up visits as told by your health care provider. This is important. Contact a health care provider if: You have cramps in your legs while walking. You have leg pain when you are at rest. You have coldness in a leg or foot. Your skin changes. You have erectile dysfunction. You have cuts or sores on your feet that are not healing. Get help right away if: Your arm or leg turns cold, numb, and blue. Your arms or legs become red, warm, swollen, painful, or numb. You have chest pain or trouble breathing. You suddenly have weakness in your face, arm, or  leg. You become very confused or lose the ability to speak. You suddenly have a very bad headache or lose your vision. Summary Peripheral vascular disease (PVD) is a disease of the blood vessels. In most cases, PVD narrows the blood vessels that carry blood from your heart to the rest of your body. PVD may cause different symptoms. Your symptoms depend on what part of your body is not getting enough blood. Treatment for PVD depends on the cause of your condition and how severe your symptoms are. This information is not intended to replace advice given to you by your health care provider. Make sure you discuss any questions you have with your health care provider. Document Revised: 07/01/2017 Document Reviewed: 08/26/2016 Elsevier Patient Education  2020 [employer]  leural effusion is an abnormal buildup of fluid in the layers of tissue between the lungs and the inside of the chest.  Pleural effusion can have many causes, including heart failure, pulmonary embolism, infections, or cancer.  Symptoms of pleural effusion can include shortness of breath, chest pain, fever, long-lasting (chronic) cough, hiccups, or rapid breathing.  Diagnosis often involves making  images of the chest (such as with ultrasound or X-ray) and removing fluid (thoracentesis) to send for testing.  Treatment for pleural effusion depends on what underlying condition is causing it. This information is not intended to replace advice given to you by your health care provider. Make sure you discuss any questions you have with your health care provider. Document Revised: 07/01/2017 Document Reviewed: 03/24/2017 Elsevier Patient Education  2020 [employer].

## 2020-06-28 NOTE — Progress Notes (Signed)
   ASSESSMENT & PLAN:  Kirk Carrillo is a 59 y.o. male with critical limb ischemia of right lower extremity. 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 Will need R CFA to R below knee popliteal bypass. Vein mapping suggests saphenous vein not adequate for conduit. I've discussed the above with the patient. Will look for time in the OR early next week. He wishes to leave and return for a bypass. Will arrange via the office.   SUBJECTIVE:  No complaints.   OBJECTIVE:  BP 124/64 (BP Location: Right Arm)   Pulse 70   Temp 98.7 F (37.1 C) (Oral)   Resp 14   Ht 5' 7.5" (1.715 m)   Wt 55.1 kg   SpO2 97%   BMI 18.73 kg/m   Intake/Output Summary (Last 24 hours) at 06/28/2020 1101 Last data filed at 06/28/2020 0551 Gross per 24 hour  Intake 1608.33 ml  Output 2725 ml  Net -1116.67 ml    Constitutional: well appearing. no acute distress. Cardiac: RRR. Vascular: left groin soft. Palpable femoral pulse   CBC Latest Ref Rng & Units 06/28/2020 06/27/2020 06/26/2020  WBC 4.0 - 10.5 K/uL 5.4 7.2 7.1  Hemoglobin 13.0 - 17.0 g/dL 8.2(L) 8.2(L) 8.2(L)  Hematocrit 39 - 52 % 27.1(L) 27.1(L) 27.2(L)  Platelets 150 - 400 K/uL 646(H) 651(H) 588(H)     CMP Latest Ref Rng & Units 06/28/2020 06/27/2020 06/26/2020  Glucose 70 - 99 mg/dL 95 94 97  BUN 8 - 23 mg/dL 7(L) 6(L) 5(L)  Creatinine 0.61 - 1.24 mg/dL 8.67(E) 7.20(N) 4.70(J)  Sodium 135 - 145 mmol/L 134(L) 134(L) 132(L)  Potassium 3.5 - 5.1 mmol/L 3.8 4.0 3.9  Chloride 98 - 111 mmol/L 100 98 97(L)  CO2 22 - 32 mmol/L 26 26 27   Calcium 8.9 - 10.3 mg/dL ) 6.2(E) 3.6(O)  Total Protein 6.5 - 8.1 g/dL - - -  Total Bilirubin 0.3 - 1.2 mg/dL - - -  Alkaline Phos 38 - 126 U/L - - -  AST 15 - 41 U/L - - -  ALT 0 - 44 U/L - - -    Estimated Creatinine Clearance: 75.6 mL/min (A) (by C-G formula based on SCr of 0.4 mg/dL (L)).  2.9(U. Rande Brunt, MD [facility] Phone Number: [phone] 06/28/2020  11:01 AM

## 2020-06-28 NOTE — Discharge Summary (Signed)
Physician Discharge Summary  Kirk Carrillo ZOX:[id number] DOB: 12-21-1960 DOA: 06/20/2020  PCP: Pcp, No  Admit date: 06/20/2020 Discharge date: 06/28/2020  Admitted From: Home Disposition: Home  Recommendations for Outpatient Follow-up:  1. Follow up with PCP in 1-2 weeks 2. Follow-up with pulmonology, scheduled on 08/08/2020 at 09 100 with Elisha Headland, NP 3. Follow-up with vascular surgery, Dr. Edilia Bo or Dr. Lenell Antu in 1 week for consideration of femoropopliteal bypass 4. Continue antibiotics with Augmentin 875-125 p.o. twice daily x6 weeks per pulmonology for empyema, cavitary pneumonia 5. Started on aspirin 81 mg p.o. daily for peripheral vascular disease 6. Will need follow-up CT chest in 3-6 months to ensure resolution of empyema/cavitary pneumonia and to exclude underlying malignancy  Home Health: No Equipment/Devices: None  Discharge Condition: Stable CODE STATUS: Full code Diet recommendation: Heart healthy diet  History of present illness:  Kirk Carrillo is a 59 year old male with past medical history significant for or essential hypertension, tobacco use disorder, alcohol use without abuse who presented to the ED with right leg pain.  Pain has progressively gotten worse over the past several days and worse with weightbearing.  Patient has noticed that he has been developing ulcerations between the fourth and fifth toes.  Due to his symptoms, he presented to [facility] health urgent care clinic.  He was initially prescribed a 7-day course of doxycycline and had a podiatry referral placed; but after this was accomplished the urgent care provider recommend patient come to the emergency department for further evaluation.  Additionally, patient complains of worsening cough for the past 6 months.  Associated with foul-smelling sputum.  Also reports a 20 pound unintentional weight loss.  Denies any international travel, no close contact with anyone diagnosed with tuberculosis,  no history of IV drug abuse or incarceration.  Denies fevers.  In the ED, x-ray right foot which revealed no evidence of osteomyelitis.  Elevated WBC count with associated tachycardia concerning for sepsis.  Chest x-ray revealing multiple right-sided pulmonary infiltrates.  CT chest with contrast with multiple cavitary lesions throughout the right lung with possible concurrent empyema.  Patient was started on empiric broad-spectrum antibiotics.  Hospitalist service was requested for further evaluation and management.  Hospital course:  Multiple right-sided pulmonary cavitations Right loculated pleural effusion/empyema Patient reports chronic cough productive of foul-smelling sputum past 6 months.  No recent travel, no IV drug use/incarceration, no contact with individuals diagnosed with TB.  CT chest with contrast notable for multiple areas of cavitation throughout the right lung, predominantly right upper lobe with several areas of the cavitation thick-walled and peripheral enhancing concerning for abscess.    QuantiFERON gold and AFB negative.  Patient initially started on empiric antibiotics with vancomycin and meropenem.  Underwent IR guided right thoracentesis with 15 mL of clear yellow fluid obtained.  Underwent IR CT-guided chest tube placement on 06/24/2020 with 2 rounds of TPA/dornase per PCCM to break up loculated empyema.  Patient had good output from his chest tube with repeat CT chest on 06/27/2020 with decreased right posterior empyema.  Pleural fluid cultures from 06/22/2020 and 06/24/2020 with no growth; although antibiotics were initiated prior to cultures being obtained.  Chest tube was removed on 06/27/2020 without issue.  Patient's antibiotics were deescalated to Augmentin and will need to complete a 6-week course per pulmonology.  Patient has outpatient follow-up with pulmonology on 08/07/2020 scheduled. Will need follow-up chest CT 3-6 months to exclude underlying  malignancy.  Peripheral vascular disease Right foot toe ulcers Patient presenting to  the ED with right foot pain with new ulcers to fourth/fifth toe.  Right foot x-ray with no acute bony abnormality.  Vascular ultrasound ABI with moderate right and left lower extremity arterial disease.  Patient underwent arteriogram on 06/27/2020 with recommendations of femoropopliteal bypass.  Patient electing to follow-up outpatient.  Follow-up with vascular surgery, Dr. Edilia Boickson or Dr. Lenell AntuHawken in one week.  Started on aspirin 81 mg p.o. daily.  Hypokalemia Hypomagnesemia Repleted during hospitalization.  Potassium 3.8 on time of discharge.  Hyponatremia Etiology likely secondary to poor oral intake.  Sodium level improved during hospitalization, was 134 at time of discharge.  Iron deficiency anemia Iron level 10, TIBC 221 (low), folate 15.8, B12 398.  Started on ferrous sulfate 325 mg p.o. 3 times daily  B12 deficiency B12 398. Cyanocobalamin 1000 mcg p.o. daily  Tobacco use disorder Discussed need for complete tobacco cessation given peripheral vascular disease.  Moderate protein calorie malnutrition Body mass index is 18.71 kg/m. Nutrition Status: Nutrition Problem: Increased nutrient needs Etiology: acute illness (rt pleural effusion) Signs/Symptoms: estimated needs Interventions: Ensure Enlive (each supplement provides 350kcal and 20 grams of protein), MVI Continue to encourage increased oral intake.  Discharge Diagnoses:  Principal Problem:   Sepsis due to pneumonia [facility](HCC) Active Problems:   Pneumonia of right lung due to infectious organism   Essential hypertension   Empyema of right pleural space (HCC)   Alcohol abuse   Ulcer of toe, right, with fat layer exposed (HCC)   Nicotine dependence, cigarettes, uncomplicated   PVD (peripheral vascular disease) [facility](HCC)    Discharge Instructions  Discharge Instructions    Diet - low sodium heart healthy   Complete by: As directed     Discharge wound care:   Complete by: As directed    Wound care to right foot, lateral 4th and medial 5th digit full thickness wounds:  Cleanse with saline, pat dry. Cover with a size appropriate piece of silver hydrofiber. Top with dry dressing and secure with Kerlix roll gauze/paper tape.   Increase activity slowly   Complete by: As directed      Allergies as of 06/28/2020      Reactions   Amlodipine Swelling   Telmisartan Other (See Comments)   Facial; angioedema.      Medication List    STOP taking these medications   benzonatate 100 MG capsule Commonly known as: TESSALON     TAKE these medications   albuterol 108 (90 Base) MCG/ACT inhaler Commonly known as: ProAir HFA Inhale 1-2 puffs into the lungs every 6 (six) hours as needed for wheezing or shortness of breath.   amoxicillin-clavulanate 875-125 MG tablet Commonly known as: AUGMENTIN Take 1 tablet by mouth every 12 (twelve) hours.   Aquacel Ag Advantage 4"X5" Pads Apply 1 application topically daily.   aspirin EC 81 MG tablet Take 1 tablet (81 mg total) by mouth daily. Swallow whole.   cyanocobalamin 1000 MCG tablet Take 1 tablet (1,000 mcg total) by mouth daily. Start taking on: June 29, 2020   diphenhydrAMINE 25 mg capsule Commonly known as: BENADRYL Take 25 mg by mouth in the morning, at noon, and at bedtime.   ferrous sulfate 325 (65 FE) MG tablet Take 1 tablet (325 mg total) by mouth 3 (three) times daily with meals.   guaifenesin 100 MG/5ML syrup Commonly known as: ROBITUSSIN Take 200 mg by mouth 2 times daily at 12 noon and 4 pm.   hydrochlorothiazide 25 MG tablet Commonly known as: HYDRODIURIL Take 1 tablet (  25 mg total) by mouth daily.   ibuprofen 200 MG tablet Commonly known as: ADVIL Take 400 mg by mouth every 6 (six) hours as needed for moderate pain.            Discharge Care Instructions  (From admission, onward)         Start     Ordered   06/28/20 0000  Discharge  wound care:       Comments: Wound care to right foot, lateral 4th and medial 5th digit full thickness wounds:  Cleanse with saline, pat dry. Cover with a size appropriate piece of silver hydrofiber. Top with dry dressing and secure with Kerlix roll gauze/paper tape.   06/28/20 2248          Follow-up Information    Coral Ceo, NP Follow up on 08/08/2020.   Specialty: Pulmonary Disease Why: Elisha Headland NP at [id number] information: [address] [place] [address] [phone]        Leonie Douglas, MD. Schedule an appointment as soon as possible for a visit in 1 week(s).   Specialties: Vascular Surgery, Interventional Cardiology Contact information: [address] [place] [address] [phone]              Allergies  Allergen Reactions   Amlodipine Swelling   Telmisartan Other (See Comments)    Facial; angioedema.     Consultations:  PCCM  Vascular surgery  Interventional radiology  CTS   Procedures/Studies: DG Chest 2 View  Result Date: 06/20/2020 CLINICAL DATA:  Chronic cough, weight loss EXAM: CHEST - 2 VIEW COMPARISON:  None. FINDINGS: Heart is normal size. Patchy airspace disease throughout the right lung. Small right pleural effusion. Left lung clear. No acute bony abnormality. IMPRESSION: Volume loss with patchy airspace disease throughout the right lung. Cannot exclude pneumonia. Small right pleural effusion. Electronically Signed   By: Charlett Nose M.D.   On: 06/20/2020 21:33   CT CHEST WO CONTRAST  Result Date: 06/27/2020 CLINICAL DATA:  Pain at chest tube site.  Cavitary pneumonia. EXAM: CT CHEST WITHOUT CONTRAST TECHNIQUE: Multidetector CT imaging of the chest was performed following the standard protocol without IV contrast. COMPARISON:  Chest x-ray from same day. CT chest dated June 20, 2020. FINDINGS: Cardiovascular: No significant vascular findings. Normal heart size. No pericardial effusion. No thoracic aortic  aneurysm. Coronary, aortic arch, and branch vessel atherosclerotic vascular disease. Mediastinum/Nodes: Unchanged mediastinal and right hilar lymphadenopathy measuring up to 15 mm in short axis. New borderline enlarged right axillary lymph node measuring 1.0 cm in short axis. The thyroid gland, trachea, and esophagus demonstrate no significant findings. Lungs/Pleura: New posterior approach right pigtail chest tube within the now significantly decreased thick-walled empyema in the right posterior pleural space. The empyema measures up to 1.6 cm in maximal thickness, previously 5.2 cm. Foci of gas within the empyema likely related to drain flushings. There are a few other small areas of loculated, non-drainable pleural fluid along the lateral right upper lobe (series 3, image 42) and lateral right lower lobe (series 3, image 97) that are unchanged when compared to the prior study. Scattered thick-walled cavitations and peribronchial thickening throughout the right lung have improved. For example, the largest cavitary lesion in the anterior right lower lobe currently measures 3.3 x 3.0 cm (series 4, image 103), previously 5.7 x 5.1 cm. The previously measured right middle lobe nodule has also decreased in size and now demonstrates central cavitation, currently measuring 1.7 x 1.3  cm (series 4, image 85), previously 2.2 x 2.2 cm. Architectural distortion, traction bronchiectasis, and areas of spiculation in the right upper lobe are not significantly changed. The left lung remains clear. Unchanged left upper lobe calcified granuloma. No pneumothorax. Upper Abdomen: No acute abnormality. Musculoskeletal: No acute or significant osseous finding. Small volume subcutaneous emphysema in the right posterior chest wall. IMPRESSION: 1. Decreased right posterior empyema status post chest tube drainage. There are a few other small areas of loculated, non-drainable pleural fluid along the lateral right upper lobe and lateral right  lower lobe that are unchanged. 2. Interval improvement in scattered thick-walled cavitary lesions and peribronchial thickening throughout the right lung, consistent with resolving necrotic pneumonia. 3. Unchanged architectural distortion, traction bronchiectasis, and areas of spiculation in the right upper lobe. While potentially related to current or prior infection, follow-up chest CT in 3-6 months is recommended to exclude underlying malignancy. 4. Unchanged mediastinal and right hilar lymphadenopathy, likely reactive. 5. Aortic Atherosclerosis (ICD10-I70.0). Electronically Signed   By: Obie Dredge M.D.   On: 06/27/2020 08:35   CT Chest W Contrast  Addendum Date: 06/21/2020   ADDENDUM REPORT: 06/21/2020 00:39 ADDENDUM: These results were called by telephone at the time of interpretation on 06/21/2020 at 12:38 am to provider Dr Martyn Malay, who verbally acknowledged these results. Electronically Signed   By: Kreg Shropshire M.D.   On: 06/21/2020 00:39   Result Date: 06/21/2020 CLINICAL DATA:  Abnormal radiograph, chronic cough, EXAM: CT CHEST WITH CONTRAST TECHNIQUE: Multidetector CT imaging of the chest was performed during intravenous contrast administration. CONTRAST:  75mL OMNIPAQUE IOHEXOL 300 MG/ML  SOLN COMPARISON:  Same-day radiograph, CT abdomen pelvis 08/06/2012 FINDINGS: Cardiovascular: Cardiac size within normal limits. Three-vessel coronary artery atherosclerosis. There is a questionable region of direct involvement of the pericardium by the cavitary lung masses along the posterior aspect of the right atrium (3/96) with distinct loss of fat plane and some pericardial fat stranding. Atherosclerotic plaque within the normal caliber aorta. No acute luminal abnormality of the imaged aorta. No periaortic stranding or hemorrhage. Normal 3 vessel branching of the aortic arch. Proximal great vessels are calcified at the ostia but otherwise unremarkable. Central pulmonary arteries are normal caliber.  There is some focal narrowing no large central filling defects are identified. More distal evaluation is limited by a non tailored technique and mild respiratory motion artifact. Mediastinum/Nodes: Mediastinal and right hilar adenopathy. Index nodes include 14 mm short axis precarinal lymph node (3/56) 15 mm short axis node versus soft tissue mass in the right hilum(3/59) 14 mm short axis high right paratracheal node (3/38) 13 mm short axis subcarinal node (3/70) Diffuse airways thickening throughout the central and more distal airways of the right lung. Soft tissue contiguous with the right paramediastinal border. Distal thoracic esophageal thickening and some mucosal hyperemia with a small sliding-type hiatal hernia. Thyroid gland and thoracic inlet are unremarkable. Lungs/Pleura: There are extensive areas of reticular change and architectural distortion throughout the right lung. Mixed areas of cavitation are present within this architectural distortion as well as other more thick-walled peripherally enhancing regions of cavitation containing air and heterogeneous material concerning for superinfection or abscess formation. Largest is seen in the right lower lobe abutting the fissure and measuring up 5.7 x 5.1 x 4.9 cm (6/95) additional spiculated centrally hypoattenuating, possibly necrotic nodule seen in the right middle lobe measuring 2.2 x 2.2 x 1.8 cm (5/76). There is loculated right pleural effusion with marked visceral and parietal pleural thickening with the largest collection  in the right lung base. Additional lobular fluid and pleural thickening along the periphery of the right lung and anteriorly. No clear extension into or through the chest wall. Left lung is predominantly clear aside from few scattered calcified granulomata. Upper Abdomen: Atherosclerotic calcifications in the upper abdomen. Distal esophageal thickening and hiatal hernia, as above. No other acute abnormalities in the upper abdomen.  Musculoskeletal: Mild levocurvature of the lower thoracic spine, apex T11. Additional mild multilevel degenerative changes in the spine and shoulders. No acute or worrisome osseous lesion. IMPRESSION: 1. Multiple areas of cavitation with extensive reticular change and architectural distortion throughout the right lung, predominantly in the right upper lobe with associated areas of traction bronchiectasis. Several areas of cavitation appear more thick-walled and peripherally enhancing containing a admixed fluid and foci of gas concerning for superinfection and or abscess formation. Loculated areas of right pleural effusion visceral and parietal pleural thickening worrisome for empyema as well. While this appearance could reflect sequela of a necrotic pneumonia with empyema, underlying malignancy is not fully excluded. 2. Mediastinal and right hilar adenopathy, possibly reactive or metastatic. 3. Circumferential thickening and mucosal hyperemia of the distal esophagus, correlate for features of esophagitis. Consider further evaluation with direct visualization. 4. Small sliding-type hiatal hernia with distal esophageal thickening and mucosal hyperemia, correlate for features of reflux. 5. Three-vessel coronary artery atherosclerosis. 6. Aortic Atherosclerosis (ICD10-I70.0). Electronically Signed: By: Kreg Shropshire M.D. On: 06/21/2020 00:24   PERIPHERAL VASCULAR CATHETERIZATION  Result Date: 06/27/2020 DATE OF SERVICE: 06/27/2020  PATIENT:  [name]  59 y.o. male  PRE-OPERATIVE DIAGNOSIS:  PVD with right foot ulcer  POST-OPERATIVE DIAGNOSIS:  * No post-op diagnosis entered *  PROCEDURE:  1) US guided LCFA access 2) Aortogram 3) RLE angiography with second order cannulation (95cc contrast)  SURGEON:  Surgeon(s) and Role:    * Leonie Douglas, MD - Primary  ASSISTANT: none  ANESTHESIA:   local  EBL: mine  BLOOD ADMINISTERED:none  DRAINS: none  LOCAL MEDICATIONS USED:  LIDOCAINE  SPECIMEN:  none   COUNTS: confirmed correct.  TOURNIQUET:  * No tourniquets in log *  PATIENT DISPOSITION:  PACU - hemodynamically stable.  Delay start of Pharmacological VTE agent (>24hrs) due to surgical blood loss or risk of bleeding: no  INDICATION FOR PROCEDURE: Conrad Zajkowski is a 59 y.o. 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 After careful discussion of risks, benefits, and alternatives the patient was offered angiography. The patient understood and wished to proceed.  OPERATIVE FINDINGS: Aorta - no flow limiting disease Iliacs - diminutive vessels; no hemodynamically significant stenosis Femorals -    R CFA no hemodynamically significant stenosis    R PFA no hemodynamically significant stenosis    Severe stenosis proximal R SFA to proximal popliteal artery    L CFA >60% stenosis    L PFA no hemodynamically significant stenosis    L SFA CTO at proximal Popliteals    R popliteal reconstitutes above the knee, but appears diseased. Less disease behind and below the knee.    L popliteal reconstitutes above the knee, but appears diseased. Less disease behind and below the knee. Tibials    R AT / Peroneal runoff to the foot    L AT / PT / Peroneal runoff to the foot  Patient will need femoral-popliteal bypass grafting. Will request cardiac risk stratification. Vein mapping.  DESCRIPTION OF PROCEDURE: After identification of the patient in the pre-operative holding  area, the patient was transferred to the operating room. The patient was positioned supine on the operating room table. Anesthesia was induced. The groins was prepped and draped in standard fashion. A surgical pause was performed confirming correct patient, procedure, and operative location.  To begin the left groin was anesthetized with 1% lidocaine. Using ultrasound guidance, the left common femoral artery was accessed with micropuncture technique. Fluoroscopy was used to confirm  cannulation over the femoral head. Sheathogram was not performed. The 76F sheath was upsized to 25F.  An 035 Teena Dunk wire was advanced into the distal aorta. Over the wire an omni flush catheter was advanced to the level of L2. Aortogram was performed. See above for findings.  The right common iliac artery was selected with an 035 floppy angled glidewire. The wire was advanced into the superficial femoral artery. Over the wire the omni flush catheter was advanced into the external iliac artery. Selective right lower angiography was performed. See above for details.  I was not able to cross the proximal aspect of the SFA lesion. Because the lesion is TASC D, I elected to end the case here.  A mynx device was used to close the arteriotomy. Hemostasis was excellent upon completion.  Upon completion of the case instrument and sharps counts were confirmed correct. The patient was transferred to the PACU in good condition. I was present for all portions of the procedure.  Rande Brunt. Lenell Antu, MD [facility] Phone Number: [phone] 06/27/2020 10:13 AM  DG CHEST PORT 1 VIEW  Result Date: 06/27/2020 CLINICAL DATA:  59 year old male with empyema status post CT-guided chest tube placement, pleural fibrinolytic administration. EXAM: PORTABLE CHEST 1 VIEW COMPARISON:  Portable chest 06/26/2020 and earlier including chest CT 06/20/2020. FINDINGS: Portable AP semi upright view at 0538 hours. Right side chest tube remains in place. No pneumothorax identified. Mildly larger lung volumes. Stable cardiac size and mediastinal contours. Residual streaky right perihilar and confluent right lung base opacity, possibly with some re-accumulation of pleural fluid compared to 06/25/2020. Stable left lung, tiny calcified left upper lobe granuloma. IMPRESSION: 1. Right chest tube remains in place with no pneumothorax. 2. Stable residual right lung opacity, possibly mild re-accumulation of pleural  fluid since 06/25/2020. Electronically Signed   By: Odessa Fleming M.D.   On: 06/27/2020 06:07   DG Chest Port 1 View  Result Date: 06/26/2020 CLINICAL DATA:  Follow-up right chest tube. EXAM: PORTABLE CHEST 1 VIEW COMPARISON:  06/25/2020 FINDINGS: Right basilar chest tube is unchanged in position from previous exam. No pneumothorax identified. Small right loculated pleural effusion is unchanged from previous exam. Atelectasis and scarring within the right midlung and right lower lobe unchanged. Left lung is clear. IMPRESSION: 1. Stable position of right chest tube. No pneumothorax. 2. No change in small loculated right pleural effusion. Electronically Signed   By: Signa Kell M.D.   On: 06/26/2020 08:57   DG Chest Port 1 View  Result Date: 06/25/2020 CLINICAL DATA:  PleurX catheter placement EXAM: PORTABLE CHEST 1 VIEW COMPARISON:  June 22, 2020 FINDINGS: PleurX catheter tip is in the right lower lung region. No pneumothorax. Small loculated pleural effusion noted on the right. There is ill-defined airspace opacity in the right mid and lower lung zones, primarily due to scarring and atelectatic change. There is slight atelectasis in the medial left base. There is a small granuloma in the left upper lobe. Left lung otherwise clear. Heart size and pulmonary vascular normal. No adenopathy.  There is aortic atherosclerosis. There are foci of calcification in each carotid artery. No bone lesions. IMPRESSION: PleurX catheter with tip in right base region. No pneumothorax. Small loculated pleural effusion on the right. Areas of apparent scarring and atelectasis right mid and lower lung regions. Mild atelectasis medial left base. Scattered granulomas on the left. No edema or airspace opacity. Stable cardiac silhouette. Aortic Atherosclerosis (ICD10-I70.0). Electronically Signed   By: Bretta Bang III M.D.   On: 06/25/2020 08:19   DG Chest Port 1 View  Result Date: 06/22/2020 CLINICAL DATA:  Post  thoracentesis. EXAM: PORTABLE CHEST 1 VIEW COMPARISON:  06/20/2020 FINDINGS: Lungs are adequately inflated with heterogeneous density over the right mid to lower lung with slight interval improved aeration over the medial right base likely due to improved known loculated pleural effusion post thoracentesis. No pneumothorax. Left lung is clear. Remainder the exam is unchanged. IMPRESSION: 1. Improved aeration over the medial right base likely improved loculated pleural effusion post thoracentesis. No pneumothorax. 2. Persistent heterogeneous density over the right mid to lower lung. Electronically Signed   By: Elberta Fortis M.D.   On: 06/22/2020 12:16   DG Foot Complete Right  Result Date: 06/20/2020 CLINICAL DATA:  Deep wound lateral 4th and 5th toes. EXAM: RIGHT FOOT COMPLETE - 3+ VIEW COMPARISON:  None. FINDINGS: No acute bony abnormality. Specifically, no fracture, subluxation, or dislocation. No bone destruction to suggest osteomyelitis. No soft tissue gas. Joint spaces maintained. IMPRESSION: No acute bony abnormality. Electronically Signed   By: Charlett Nose M.D.   On: 06/20/2020 21:32   VAS [place] ABI WITH/WO TBI  Result Date: 06/23/2020 LOWER EXTREMITY DOPPLER STUDY Indications: Claudication, ulceration, and peripheral artery disease. High Risk Factors: Current smoker. Other Factors: Has known PAD, prior workup at the [place].  Comparison Study: No prior study on file Performing Technologist: Sherren Kerns RVS  Examination Guidelines: A complete evaluation includes at minimum, Doppler waveform signals and systolic blood pressure reading at the level of bilateral brachial, anterior tibial, and posterior tibial arteries, when vessel segments are accessible. Bilateral testing is considered an integral part of a complete examination. Photoelectric Plethysmograph (PPG) waveforms and toe systolic pressure readings are included as required and additional duplex testing as needed. Limited examinations for  reoccurring indications may be performed as noted.  ABI Findings: +---------+------------------+-----+----------+--------+  Right     Rt Pressure (mmHg) Index Waveform   Comment   +---------+------------------+-----+----------+--------+  Brachial  129                      triphasic            +---------+------------------+-----+----------+--------+  PTA       53                 0.41  monophasic           +---------+------------------+-----+----------+--------+  DP        69                 0.53  monophasic           +---------+------------------+-----+----------+--------+  Great Toe 14                 0.11                       +---------+------------------+-----+----------+--------+ +---------+------------------+-----+----------+-------+  Left      Lt Pressure (mmHg) Index Waveform   Comment  +---------+------------------+-----+----------+-------+  Brachial  120  triphasic           +---------+------------------+-----+----------+-------+  PTA       76                 0.59  monophasic          +---------+------------------+-----+----------+-------+  PERO      77                 0.60  monophasic          +---------+------------------+-----+----------+-------+  Great Toe 87                 0.67                      +---------+------------------+-----+----------+-------+ +-------+-----------+-----------+------------+------------+  ABI/TBI Today's ABI Today's TBI Previous ABI Previous TBI  +-------+-----------+-----------+------------+------------+  Right   0.53        0.11                                   +-------+-----------+-----------+------------+------------+  Left    0.60        0.67                                   +-------+-----------+-----------+------------+------------+  Summary: Right: Resting right ankle-brachial index indicates moderate right lower extremity arterial disease. The right toe-brachial index is abnormal. Left: Resting left ankle-brachial index indicates moderate left  lower extremity arterial disease. The left toe-brachial index is abnormal.  *See table(s) above for measurements and observations.  Electronically signed by Sherald Hess MD on 06/23/2020 at 4:41:57 PM.    Final    VAS [place] LOWER EXTREMITY SAPHENOUS VEIN MAPPING  Result Date: 06/27/2020 LOWER EXTREMITY VEIN MAPPING Indications: Pre-op, ulceration, and PVD  Limitations: Anatomy Comparison Study: No prior study Performing Technologist: Gertie Fey MHA, RDMS, RVT, RDCS  Examination Guidelines: A complete evaluation includes B-mode imaging, spectral Doppler, color Doppler, and power Doppler as needed of all accessible portions of each vessel. Bilateral testing is considered an integral part of a complete examination. Limited examinations for reoccurring indications may be performed as noted. +--------------+-------------+--------------------+-------------+--------------+   RT Diameter    RT Findings          GSV           LT Diameter   LT Findings          (cm)                                             (cm)                      +--------------+-------------+--------------------+-------------+--------------+       0.22                       Saphenofemoral        0.53                                                          Junction                                     +--------------+-------------+--------------------+-------------+--------------+  0.21                       Proximal thigh        0.38                      +--------------+-------------+--------------------+-------------+--------------+       0.12                         Mid thigh                     not visualized  +--------------+-------------+--------------------+-------------+--------------+                      not          Distal thigh                   not visualized                   visualized                                                      +--------------+-------------+--------------------+-------------+--------------+                       not              Knee                       not visualized                   visualized                                                      +--------------+-------------+--------------------+-------------+--------------+                      not           Prox calf                     not visualized                   visualized                                                      +--------------+-------------+--------------------+-------------+--------------+                      not            Mid calf                     not visualized                   visualized                                                      +--------------+-------------+--------------------+-------------+--------------+  not          Distal calf                    not visualized                   visualized                                                      +--------------+-------------+--------------------+-------------+--------------+                      not             Ankle                       not visualized                   visualized                                                      +--------------+-------------+--------------------+-------------+--------------+ +----------------+-----------+---------------+----------------+--------------+  RT diameter (cm) RT Findings       SSV       LT Diameter (cm)  LT Findings    +----------------+-----------+---------------+----------------+--------------+        0.35                   Popliteal fossa       0.28                       +----------------+-----------+---------------+----------------+--------------+        0.28                    Proximal calf                   not visualized  +----------------+-----------+---------------+----------------+--------------+        0.19                      Mid calf                      not visualized  +----------------+-----------+---------------+----------------+--------------+        0.24                      Distal calf                    not visualized  +----------------+-----------+---------------+----------------+--------------+ Diagnosing physician: Heath Lark Electronically signed by Heath Lark on 06/27/2020 at 2:59:58 PM.    Final    CT PERC PLEURAL DRAIN W/INDWELL CATH W/IMG GUIDE  Result Date: 06/24/2020 INDICATION: 59 year old with a loculated right pleural effusion and only a small amount of fluid could be removed during thoracentesis. Request for a large-bore chest tube. Patient also has cavitary lesions in the right lung. EXAM: CT-GUIDED RIGHT CHEST TUBE PLACEMENT MEDICATIONS: Moderate sedation ANESTHESIA/SEDATION: Fentanyl 50 mcg IV; Versed 1.5 mg IV Moderate Sedation Time:  23 minutes The patient was continuously monitored during the procedure by the interventional radiology nurse under my direct supervision. COMPLICATIONS: None immediate. PROCEDURE:  Informed written consent was obtained from the patient after a thorough discussion of the procedural risks, benefits and alternatives. All questions were addressed. Maximal Sterile Barrier Technique was utilized including caps, mask, sterile gowns, sterile gloves, sterile drape, hand hygiene and skin antiseptic. A timeout was performed prior to the initiation of the procedure. Patient was placed prone. CT images through the chest were obtained. The loculated posterior right pleural effusion was identified and targeted. The right side of the back was prepped with chlorhexidine and sterile field was created. Skin was anesthetized using 1% lidocaine. Using CT guidance, a Yueh catheter was directed into the loculated effusion and a small amount of yellow fluid was aspirated. A J wire was advanced into the pleural space and the tract was dilated to accommodate a 16 [place] Thal- Quick drain. Drain was advanced over the wire. Approximately 20 mL of cloudy grayish colored purulent fluid was aspirated. Drain was sutured in place and attached to a chest  drainage system. Minimal fluid was draining after placement to the suction. FINDINGS: Loculated posterior right pleural effusion. 16 French drain was successfully placed within the loculated effusion. Approximately 20 mL of purulent looking fluid was removed and findings are suggestive for an empyema. IMPRESSION: CT-guided placement of a chest tube within the right chest empyema. Fluid was sent for culture. Electronically Signed   By: Richarda Overlie M.D.   On: 06/24/2020 17:22   US THORACENTESIS ASP PLEURAL SPACE W/IMG GUIDE  Result Date: 06/22/2020 INDICATION: Patient with history of sepsis secondary to pneumonia, dyspnea, and loculated right pleural effusion. Request is made for diagnostic and therapeutic right thoracentesis. EXAM: ULTRASOUND GUIDED DIAGNOSTIC AND THERAPEUTIC RIGHT THORACENTESIS MEDICATIONS: 10 mL 1% lidocaine COMPLICATIONS: None immediate. PROCEDURE: An ultrasound guided thoracentesis was thoroughly discussed with the patient and questions answered. The benefits, risks, alternatives and complications were also discussed. The patient understands and wishes to proceed with the procedure. Written consent was obtained. Ultrasound was performed to localize and mark an adequate pocket of loculated fluid in the right chest. The area was then prepped and draped in the normal sterile fashion. 1% Lidocaine was used for local anesthesia. Under ultrasound guidance a 6 Fr Safe-T-Centesis catheter was introduced. Thoracentesis was performed. The catheter was removed and a dressing applied. FINDINGS: A total of approximately 15 mL of clear yellow fluid was removed. Samples were sent to the laboratory as requested by the clinical team. Right pleural fluid is very loculated and only a small amount of fluid could be obtained. IMPRESSION: Successful ultrasound guided right thoracentesis yielding 15 mL of pleural fluid. Right pleural effusion is very loculated. Read by: Elwin Mocha, PA-C Electronically Signed    By: Richarda Overlie M.D.   On: 06/22/2020 11:47     Subjective: Patient seen and examined bedside, resting comfortably.  Wishes to discharge home today and follow-up with vascular surgery outpatient.  Chest tube removed yesterday.  States breathing is at baseline and no specific complaints this morning.  Denies headache, no visual changes, no chest pain, no palpitations, no shortness of breath, no fever/chills/night sweats, no nausea/vomiting/diarrhea, no abdominal pain, no weakness, no fatigue, no paresthesias.  No acute events overnight per nursing staff.  Discharge Exam: Vitals:   06/27/20 1954 06/28/20 0555  BP: 138/85 124/64  Pulse: 82 70  Resp: 14 14  Temp: 98.5 F (36.9 C) 98.7 F (37.1 C)  SpO2: 98% 97%   Vitals:   06/27/20 1445 06/27/20 1954 06/28/20 0150 06/28/20 0555  BP: 119/85 138/85  124/64  Pulse:  82  70  Resp:  14  14  Temp:  98.5 F (36.9 C)  98.7 F (37.1 C)  TempSrc:  Oral  Oral  SpO2:  98%  97%  Weight:   55.1 kg   Height:        General: Pt is alert, awake, not in acute distress Cardiovascular: RRR, S1/S2 +, no rubs, no gallops Respiratory: CTA bilaterally, no wheezing, no rhonchi, oxygenating well on room air Abdominal: Soft, NT, ND, bowel sounds + Extremities: no edema, right foot fourth/fifth toe ulcerations noted, dressing in place, clean/dry/intact    The results of significant diagnostics from this hospitalization (including imaging, microbiology, ancillary and laboratory) are listed below for reference.     Microbiology: Recent Results (from the past 240 hour(s))  Blood culture (routine x 2)     Status: None   Collection Time: 06/20/20  9:45 PM   Specimen: BLOOD LEFT ARM  Result Value Ref Range Status   Specimen Description BLOOD LEFT ARM  Final   Special Requests   Final    BOTTLES DRAWN AEROBIC AND ANAEROBIC Blood Culture adequate volume   Culture   Final    NO GROWTH 5 DAYS Performed at [facility], [address]  [place], [place] [address]    Report Status 06/25/2020 FINAL  Final  Blood culture (routine x 2)     Status: None   Collection Time: 06/20/20  9:46 PM   Specimen: BLOOD RIGHT ARM  Result Value Ref Range Status   Specimen Description BLOOD RIGHT ARM  Final   Special Requests   Final    BOTTLES DRAWN AEROBIC AND ANAEROBIC Blood Culture adequate volume   Culture   Final    NO GROWTH 5 DAYS Performed at [facility], [address] [place], [place] [address]    Report Status 06/25/2020 FINAL  Final  Respiratory Panel by RT PCR (Flu A&B, Covid) - Nasopharyngeal Swab     Status: None   Collection Time: 06/20/20 10:48 PM   Specimen: Nasopharyngeal Swab; Nasopharyngeal(NP) swabs in vial transport medium  Result Value Ref Range Status   SARS Coronavirus 2 by RT PCR NEGATIVE NEGATIVE Final    Comment: (NOTE) SARS-CoV-2 target nucleic acids are NOT DETECTED.  The SARS-CoV-2 RNA is generally detectable in upper respiratoy specimens during the acute phase of infection. The lowest concentration of SARS-CoV-2 viral copies this assay can detect is 131 copies/mL. A negative result does not preclude SARS-Cov-2 infection and should not be used as the sole basis for treatment or other patient management decisions. A negative result may occur with  improper specimen collection/handling, submission of specimen other than nasopharyngeal swab, presence of viral mutation(s) within the areas targeted by this assay, and inadequate number of viral copies (<131 copies/mL). A negative result must be combined with clinical observations, patient history, and epidemiological information. The expected result is Negative.  Fact Sheet for Patients:  https://www.moore.com/  Fact Sheet for Healthcare Providers:  https://www.young.biz/  This test is no t yet approved or cleared by the [place] FDA and  has been authorized for detection and/or diagnosis of SARS-CoV-2  by FDA under an Emergency Use Authorization (EUA). This EUA will remain  in effect (meaning this test can be used) for the duration of the COVID-19 declaration under Section 564(b)(1) of the Act, 21 U.S.C. section 360bbb-3(b)(1), unless the authorization is terminated or revoked sooner.     Influenza A by PCR NEGATIVE NEGATIVE Final  Influenza B by PCR NEGATIVE NEGATIVE Final    Comment: (NOTE) The Xpert Xpress SARS-CoV-2/FLU/RSV assay is intended as an aid in  the diagnosis of influenza from Nasopharyngeal swab specimens and  should not be used as a sole basis for treatment. Nasal washings and  aspirates are unacceptable for Xpert Xpress SARS-CoV-2/FLU/RSV  testing.  Fact Sheet for Patients: https://www.moore.com/  Fact Sheet for Healthcare Providers: https://www.young.biz/  This test is not yet approved or cleared by the [place] FDA and  has been authorized for detection and/or diagnosis of SARS-CoV-2 by  FDA under an Emergency Use Authorization (EUA). This EUA will remain  in effect (meaning this test can be used) for the duration of the  Covid-19 declaration under Section 564(b)(1) of the Act, 21  U.S.C. section 360bbb-3(b)(1), unless the authorization is  terminated or revoked. Performed at [facility], [address], [place], [place] [address]   Expectorated sputum assessment w rflx to resp cult     Status: None   Collection Time: 06/21/20  1:16 AM   Specimen: Expectorated Sputum  Result Value Ref Range Status   Specimen Description EXPECTORATED SPUTUM  Final   Special Requests NONE  Final   Sputum evaluation   Final    THIS SPECIMEN IS ACCEPTABLE FOR SPUTUM CULTURE Performed at [facility], [address], [place], [place] [address]    Report Status 06/21/2020 FINAL  Final  Culture, respiratory     Status: None   Collection Time: 06/21/20  1:16 AM  Result Value Ref Range Status   Specimen Description  EXPECTORATED SPUTUM  Final   Special Requests NONE Reflexed from [id number]  Final   Gram Stain   Final    ABUNDANT WBC PRESENT, PREDOMINANTLY PMN RARE SQUAMOUS EPITHELIAL CELLS PRESENT MODERATE YEAST RARE GRAM POSITIVE COCCI RARE GRAM NEGATIVE RODS    Culture   Final    Normal respiratory flora-no Staph aureus or Pseudomonas seen Performed at [facility], [address], [place], [place] [address]    Report Status 06/23/2020 FINAL  Final  MTB RIF NAA w/o Culture, Sputum     Status: None   Collection Time: 06/21/20  3:55 AM   Specimen: Expectorated Sputum  Result Value Ref Range Status   M Tuberculosis Complex Comment NOT DETECTED Final    Comment: (NOTE) Mycobacterium tuberculosis complex (MTBC) NOT detected. Per the College of American Pathologists (CAP) guidelines, a culture must be performed on all samples regardless of the molecular test result. By ordering this test code, the client has assumed responsibility for the performance of the culture.    Rifampin Comment Susceptible Final    Comment: (NOTE) Because Mycobacterium tuberculosis complex (MTBC) was not detected, no rifampin determination is possible.    AFB Specimen Processing Concentration  Final    Comment: (NOTE) Performed At: [facility] [address] [place], [place] [id number] Jolene Schimke MD IH:[id number]   Acid Fast Smear (AFB)     Status: None   Collection Time: 06/22/20  8:17 AM   Specimen: Sputum  Result Value Ref Range Status   AFB Specimen Processing Concentration  Final   Acid Fast Smear Negative  Final    Comment: (NOTE) Performed At: [facility] [address] [place], [place] [id number] Jolene Schimke MD [id number]    Source (AFB) SPU  Final    Comment: Performed at [facility], [address], [place], [place] [address]  Body fluid culture  Status: None   Collection Time: 06/22/20 11:23 AM   Specimen: PATH Cytology Pleural fluid  Result Value Ref  Range Status   Specimen Description PLEURAL FLUID  Final   Special Requests RIGHT  Final   Gram Stain   Final    ABUNDANT WBC PRESENT, PREDOMINANTLY PMN NO ORGANISMS SEEN    Culture   Final    NO GROWTH 3 DAYS Performed at [facility], [address], [place], [place] [address]    Report Status 06/25/2020 FINAL  Final  Acid Fast Smear (AFB)     Status: None   Collection Time: 06/22/20  4:27 PM   Specimen: Pleural; Respiratory  Result Value Ref Range Status   AFB Specimen Processing Concentration  Final   Acid Fast Smear Negative  Final    Comment: (NOTE) Performed At: [facility] [address] [place], [place] [id number] Jolene Schimke MD XB:[id number]    Source (AFB) PLEURAL  Final    Comment: FLUID Performed at [facility], [address] [place], [place] [address]   Acid Fast Smear (AFB)     Status: None   Collection Time: 06/23/20  5:00 AM   Specimen: Sputum  Result Value Ref Range Status   AFB Specimen Processing Concentration  Final   Acid Fast Smear Negative  Final    Comment: (NOTE) Performed At: [facility], [facility], [facility] [address] [place], [place] [id number] Jolene Schimke MD [id number]    Source (AFB) EXPECTORATED SPUTUM  Final    Comment: Performed at [facility], [address], [place], [place] [address]  Aerobic/Anaerobic Culture (surgical/deep wound)     Status: None (Preliminary result)   Collection Time: 06/24/20  5:23 PM   Specimen: Pleural Fluid  Result Value Ref Range Status   Specimen Description PLEURAL  Final   Special Requests NONE  Final   Gram Stain   Final    ABUNDANT WBC PRESENT, PREDOMINANTLY PMN NO ORGANISMS SEEN    Culture   Final    NO GROWTH 3 DAYS NO ANAEROBES ISOLATED; CULTURE IN PROGRESS FOR 5 DAYS Performed at [facility], [address], [place], [place] [address]    Report Status PENDING  Incomplete  Acid Fast Smear (AFB)     Status: None   Collection Time: 06/24/20  6:19 PM    Specimen: Sputum  Result Value Ref Range Status   AFB Specimen Processing Concentration  Final   Acid Fast Smear Negative  Final    Comment: (NOTE) Performed At: [facility] [address] [place], [place] [id number] Jolene Schimke MD OV:[id number]    Source (AFB) EXPECTORATED SPUTUM  Final    Comment: Performed at [facility], [address], [place], [place] [address]     Labs: BNP (last 3 results) No results for input(s): BNP in the last 8760 hours. Basic Metabolic Panel: Recent Labs  Lab 06/22/20 0444 06/22/20 0444 06/23/20 0649 06/25/20 0340 06/26/20 0217 06/27/20 0332 06/28/20 0315  NA 129*   < > 132* 131* 132* 134* 134*  K 3.2*   < > 4.1 4.3 3.9 4.0 3.8  CL 97*   < > 100 98 97* 98 100  CO2 25   < > 25 25 27 26 26   GLUCOSE 109*   < > 96 99 97 94 95  BUN 5*   < > 6* 6* 5* 6* 7*  CREATININE 0.38*   < > 0.37* 0.37* 0.45* 0.44* 0.40*  CALCIUM 7.9*   < > 8.1* 8.5* 8.5* 8.7* 8.4*  MG 1.7  --   --  1.9 1.9  --   --    < > = values in this interval not displayed.   Liver Function Tests: Recent Labs  Lab 06/22/20 0444  AST 23  ALT 23  ALKPHOS 63  BILITOT 0.2*  PROT 5.9*  ALBUMIN 1.6*   No results for input(s): LIPASE, AMYLASE in the last 168 hours. No results for input(s): AMMONIA in the last 168 hours. CBC: Recent Labs  Lab 06/22/20 0444 06/23/20 0649 06/24/20 0347 06/25/20 0340 06/26/20 0217 06/27/20 0332 06/28/20 0315  WBC 13.6*   < > 11.3* 9.2 7.1 7.2 5.4  NEUTROABS 10.5*  --   --   --   --   --   --   HGB 8.1*   < > 7.9* 8.3* 8.2* 8.2* 8.2*  HCT 26.0*   < > 25.4* 27.2* 27.2* 27.1* 27.1*  MCV 77.6*   < > 77.9* 78.8* 79.5* 79.5* 78.1*  PLT 433*   < > 515* 548* 588* 651* 646*   < > = values in this interval not displayed.   Cardiac Enzymes: No results for input(s): CKTOTAL, CKMB, CKMBINDEX, TROPONINI in the last 168 hours. BNP: Invalid input(s): POCBNP CBG: No results for input(s): GLUCAP in the last 168 hours. D-Dimer No  results for input(s): DDIMER in the last 72 hours. Hgb A1c Recent Labs    06/27/20 1411  HGBA1C 6.2*   Lipid Profile Recent Labs    06/28/20 0315  CHOL 91  HDL 18*  LDLCALC 50  TRIG 960  CHOLHDL 5.1   Thyroid function studies No results for input(s): TSH, T4TOTAL, T3FREE, THYROIDAB in the last 72 hours.  Invalid input(s): FREET3 Anemia work up No results for input(s): VITAMINB12, FOLATE, FERRITIN, TIBC, IRON, RETICCTPCT in the last 72 hours. Urinalysis    Component Value Date/Time   COLORURINE YELLOW 06/21/2020 0116   APPEARANCEUR CLEAR 06/21/2020 0116   LABSPEC 1.038 (H) 06/21/2020 0116   PHURINE 6.0 06/21/2020 0116   GLUCOSEU NEGATIVE 06/21/2020 0116   HGBUR NEGATIVE 06/21/2020 0116   BILIRUBINUR NEGATIVE 06/21/2020 0116   KETONESUR 20 (A) 06/21/2020 0116   PROTEINUR NEGATIVE 06/21/2020 0116   UROBILINOGEN 0.2 08/06/2012 2125   NITRITE NEGATIVE 06/21/2020 0116   LEUKOCYTESUR NEGATIVE 06/21/2020 0116   Sepsis Labs Invalid input(s): PROCALCITONIN,  WBC,  LACTICIDVEN Microbiology Recent Results (from the past 240 hour(s))  Blood culture (routine x 2)     Status: None   Collection Time: 06/20/20  9:45 PM   Specimen: BLOOD LEFT ARM  Result Value Ref Range Status   Specimen Description BLOOD LEFT ARM  Final   Special Requests   Final    BOTTLES DRAWN AEROBIC AND ANAEROBIC Blood Culture adequate volume   Culture   Final    NO GROWTH 5 DAYS Performed at [facility], [address], [place], [place] [address]    Report Status 06/25/2020 FINAL  Final  Blood culture (routine x 2)     Status: None   Collection Time: 06/20/20  9:46 PM   Specimen: BLOOD RIGHT ARM  Result Value Ref Range Status   Specimen Description BLOOD RIGHT ARM  Final   Special Requests   Final    BOTTLES DRAWN AEROBIC AND ANAEROBIC Blood Culture adequate volume   Culture   Final    NO GROWTH 5 DAYS Performed at [facility], [address] [place], [place] [address]    Report  Status 06/25/2020 FINAL  Final  Respiratory Panel by RT PCR (Flu A&B, Covid) - Nasopharyngeal Swab     Status: None   Collection Time: 06/20/20 10:48 PM   Specimen: Nasopharyngeal Swab; Nasopharyngeal(NP) swabs in vial transport medium  Result Value Ref Range Status   SARS Coronavirus 2 by RT PCR NEGATIVE NEGATIVE Final    Comment: (NOTE) SARS-CoV-2 target nucleic acids are NOT DETECTED.  The SARS-CoV-2 RNA is generally detectable in upper respiratoy specimens during the acute phase of infection. The lowest concentration of SARS-CoV-2 viral copies this assay can detect is 131 copies/mL. A negative result does not preclude SARS-Cov-2 infection and should not be used as the sole basis for treatment or other patient management decisions. A negative result may occur with  improper specimen collection/handling, submission of specimen other than nasopharyngeal swab, presence of viral mutation(s) within the areas targeted by this assay, and inadequate number of viral copies (<131 copies/mL). A negative result must be combined with clinical observations, patient history, and epidemiological information. The expected result is Negative.  Fact Sheet for Patients:  https://www.moore.com/  Fact Sheet for Healthcare Providers:  https://www.young.biz/  This test is no t yet approved or cleared by the [place] FDA and  has been authorized for detection and/or diagnosis of SARS-CoV-2 by FDA under an Emergency Use Authorization (EUA). This EUA will remain  in effect (meaning this test can be used) for the duration of the COVID-19 declaration under Section 564(b)(1) of the Act, 21 U.S.C. section 360bbb-3(b)(1), unless the authorization is terminated or revoked sooner.     Influenza A by PCR NEGATIVE NEGATIVE Final   Influenza B by PCR NEGATIVE NEGATIVE Final    Comment: (NOTE) The Xpert Xpress SARS-CoV-2/FLU/RSV assay is intended as an aid in  the  diagnosis of influenza from Nasopharyngeal swab specimens and  should not be used as a sole basis for treatment. Nasal washings and  aspirates are unacceptable for Xpert Xpress SARS-CoV-2/FLU/RSV  testing.  Fact Sheet for Patients: https://www.moore.com/  Fact Sheet for Healthcare Providers: https://www.young.biz/  This test is not yet approved or cleared by the [place] FDA and  has been authorized for detection and/or diagnosis of SARS-CoV-2 by  FDA under an Emergency Use Authorization (EUA). This EUA will remain  in effect (meaning this test can be used) for the duration of the  Covid-19 declaration under Section 564(b)(1) of the Act, 21  U.S.C. section 360bbb-3(b)(1), unless the authorization is  terminated or revoked. Performed at [facility], [address] [place], [place] [address]   Expectorated sputum assessment w rflx to resp cult     Status: None   Collection Time: 06/21/20  1:16 AM   Specimen: Expectorated Sputum  Result Value Ref Range Status   Specimen Description EXPECTORATED SPUTUM  Final   Special Requests NONE  Final   Sputum evaluation   Final    THIS SPECIMEN IS ACCEPTABLE FOR SPUTUM CULTURE Performed at [facility], [address], [place], [place] [address]    Report Status 06/21/2020 FINAL  Final  Culture, respiratory     Status: None   Collection Time: 06/21/20  1:16 AM  Result Value Ref Range Status   Specimen Description EXPECTORATED SPUTUM  Final   Special Requests NONE Reflexed from [id number]  Final   Gram Stain   Final    ABUNDANT WBC PRESENT, PREDOMINANTLY PMN RARE SQUAMOUS EPITHELIAL CELLS PRESENT MODERATE YEAST  RARE GRAM POSITIVE COCCI RARE GRAM NEGATIVE RODS    Culture   Final    Normal respiratory flora-no Staph aureus or Pseudomonas seen Performed at [facility], [address], [place], [place] [address]    Report Status 06/23/2020 FINAL  Final  MTB RIF NAA w/o Culture,  Sputum     Status: None   Collection Time: 06/21/20  3:55 AM   Specimen: Expectorated Sputum  Result Value Ref Range Status   M Tuberculosis Complex Comment NOT DETECTED Final    Comment: (NOTE) Mycobacterium tuberculosis complex (MTBC) NOT detected. Per the College of American Pathologists (CAP) guidelines, a culture must be performed on all samples regardless of the molecular test result. By ordering this test code, the client has assumed responsibility for the performance of the culture.    Rifampin Comment Susceptible Final    Comment: (NOTE) Because Mycobacterium tuberculosis complex (MTBC) was not detected, no rifampin determination is possible.    AFB Specimen Processing Concentration  Final    Comment: (NOTE) Performed At: [facility] [address] [place], [place] [id number] Jolene Schimke MD [id number]:[id number]   Acid Fast Smear (AFB)     Status: None   Collection Time: 06/22/20  8:17 AM   Specimen: Sputum  Result Value Ref Range Status   AFB Specimen Processing Concentration  Final   Acid Fast Smear Negative  Final    Comment: (NOTE) Performed At: [facility] [address] [place], [place] [id number] Jolene Schimke MD [id number]    Source (AFB) SPU  Final    Comment: Performed at [facility], [address] [place], [place] [address]  Body fluid culture     Status: None   Collection Time: 06/22/20 11:23 AM   Specimen: PATH Cytology Pleural fluid  Result Value Ref Range Status   Specimen Description PLEURAL FLUID  Final   Special Requests RIGHT  Final   Gram Stain   Final    ABUNDANT WBC PRESENT, PREDOMINANTLY PMN NO ORGANISMS SEEN    Culture   Final    NO GROWTH 3 DAYS Performed at [facility], [address] [place], [place] [address]    Report Status 06/25/2020 FINAL  Final  Acid Fast Smear (AFB)     Status: None   Collection Time: 06/22/20  4:27 PM   Specimen: Pleural; Respiratory  Result Value Ref Range Status    AFB Specimen Processing Concentration  Final   Acid Fast Smear Negative  Final    Comment: (NOTE) Performed At: [facility] [address] [place], [place] [id number] Jolene Schimke MD VZ:[id number]    Source (AFB) PLEURAL  Final    Comment: FLUID Performed at [facility], [address] [place], [place] [address]   Acid Fast Smear (AFB)     Status: None   Collection Time: 06/23/20  5:00 AM   Specimen: Sputum  Result Value Ref Range Status   AFB Specimen Processing Concentration  Final   Acid Fast Smear Negative  Final    Comment: (NOTE) Performed At: [facility] [address] [place], [place] [id number] Jolene Schimke MD [id number]:[id number]    Source (AFB) EXPECTORATED SPUTUM  Final    Comment: Performed at [facility], [address] [place], [place] [address]  Aerobic/Anaerobic Culture (surgical/deep wound)     Status: None (Preliminary result)   Collection Time: 06/24/20  5:23 PM   Specimen: Pleural Fluid  Result Value Ref Range Status  Specimen Description PLEURAL  Final   Special Requests NONE  Final   Gram Stain   Final    ABUNDANT WBC PRESENT, PREDOMINANTLY PMN NO ORGANISMS SEEN    Culture   Final    NO GROWTH 3 DAYS NO ANAEROBES ISOLATED; CULTURE IN PROGRESS FOR 5 DAYS Performed at [facility], [address], [place], [place] [address]    Report Status PENDING  Incomplete  Acid Fast Smear (AFB)     Status: None   Collection Time: 06/24/20  6:19 PM   Specimen: Sputum  Result Value Ref Range Status   AFB Specimen Processing Concentration  Final   Acid Fast Smear Negative  Final    Comment: (NOTE) Performed At: [facility] [address] [place], [place] [id number] Jolene Schimke MD [id number]:[id number]    Source (AFB) EXPECTORATED SPUTUM  Final    Comment: Performed at [facility], [address] [place], [place] [address]     Time coordinating discharge: Over 30 minutes  SIGNED:   Alvira Philips [place],  DO  [facility] 06/28/2020, 10:18 AM

## 2020-06-29 LAB — AEROBIC/ANAEROBIC CULTURE W GRAM STAIN (SURGICAL/DEEP WOUND): Culture: NO GROWTH

## 2020-06-30 ENCOUNTER — Other Ambulatory Visit: Payer: Self-pay

## 2020-08-05 LAB — ACID FAST CULTURE WITH REFLEXED SENSITIVITIES (MYCOBACTERIA)
Acid Fast Culture: NEGATIVE
Acid Fast Culture: NEGATIVE

## 2020-08-06 LAB — ACID FAST CULTURE WITH REFLEXED SENSITIVITIES (MYCOBACTERIA): Acid Fast Culture: NEGATIVE

## 2020-08-07 LAB — ACID FAST CULTURE WITH REFLEXED SENSITIVITIES (MYCOBACTERIA): Acid Fast Culture: NEGATIVE

## 2020-08-08 ENCOUNTER — Inpatient Hospital Stay: Payer: BLUE CROSS/BLUE SHIELD | Admitting: Pulmonary Disease

## 2020-08-11 NOTE — Progress Notes (Signed)
[id number] [place], [place] - [address] AT [facility] OF [facility] [address] [place] [address] Phone: [phone] Fax: [phone]      Your procedure is scheduled on 08/14/20.  Report to [facility] Main Entrance "A" at 05:30 A.M., and check in at the Admitting office.  Call this number if you have problems the morning of surgery:  [phone]  Call [phone] if you have any questions prior to your surgery date Monday-Friday 8am-4pm    Remember:  Do not eat or drink after midnight the night before your surgery    Take these medicines the morning of surgery with A SIP OF WATER  diphenhydrAMINE (BENADRYL) Naphazoline HCl (CLEAR EYES OP)- if needed   As of today, STOP taking any Aspirin (unless otherwise instructed by your surgeon) Aleve, Naproxen, Ibuprofen, Motrin, Advil, Goody's, BC's, all herbal medications, fish oil, and all vitamins.                      Do not wear jewelry, make up, or nail polish            Do not wear lotions, powders, perfumes/colognes, or deodorant.            Men may shave face and neck.            Do not bring valuables to the hospital.            [facility] is not responsible for any belongings or valuables.  Do NOT Smoke (Tobacco/Vaping) or drink Alcohol 24 hours prior to your procedure If you use a CPAP at night, you may bring all equipment for your overnight stay.   Contacts, glasses, dentures or bridgework may not be worn into surgery.      For patients admitted to the hospital, discharge time will be determined by your treatment team.   Patients discharged the day of surgery will not be allowed to drive home, and someone needs to stay with them for 24 hours.    Special instructions:   [place]- Preparing For Surgery  Before surgery, you can play an important role. Because skin is not sterile, your skin needs to be as free of germs as possible. You can reduce the number of germs on  your skin by washing with CHG (chlorahexidine gluconate) Soap before surgery.  CHG is an antiseptic cleaner which kills germs and bonds with the skin to continue killing germs even after washing.    Oral Hygiene is also important to reduce your risk of infection.  Remember - BRUSH YOUR TEETH THE MORNING OF SURGERY WITH YOUR REGULAR TOOTHPASTE  Please do not use if you have an allergy to CHG or antibacterial soaps. If your skin becomes reddened/irritated stop using the CHG.  Do not shave (including legs and underarms) for at least 48 hours prior to first CHG shower. It is OK to shave your face.  Please follow these instructions carefully.   1. Shower the NIGHT BEFORE SURGERY and the MORNING OF SURGERY with CHG Soap.   2. If you chose to wash your hair, wash your hair first as usual with your normal shampoo.  3. After you shampoo, rinse your hair and body thoroughly to remove the shampoo.  4. Use CHG as you would any other liquid soap. You can apply CHG directly to the skin and wash gently with a scrungie or a clean washcloth.   5. Apply the CHG  Soap to your body ONLY FROM THE NECK DOWN.  Do not use on open wounds or open sores. Avoid contact with your eyes, ears, mouth and genitals (private parts). Wash Face and genitals (private parts)  with your normal soap.   6. Wash thoroughly, paying special attention to the area where your surgery will be performed.  7. Thoroughly rinse your body with warm water from the neck down.  8. DO NOT shower/wash with your normal soap after using and rinsing off the CHG Soap.  9. Pat yourself dry with a CLEAN TOWEL.  10. Wear CLEAN PAJAMAS to bed the night before surgery  11. Place CLEAN SHEETS on your bed the night of your first shower and DO NOT SLEEP WITH PETS.   Day of Surgery: Take a shower.  Wear Clean/Comfortable clothing the morning of surgery Do not apply any deodorants/lotions.   Remember to brush your teeth WITH YOUR REGULAR TOOTHPASTE.    Please read over the following fact sheets that you were given.

## 2020-08-12 ENCOUNTER — Other Ambulatory Visit: Payer: Self-pay

## 2020-08-12 ENCOUNTER — Encounter (HOSPITAL_COMMUNITY)
Admission: RE | Admit: 2020-08-12 | Discharge: 2020-08-12 | Disposition: A | Discharge status: Home / Self Care | Payer: BLUE CROSS/BLUE SHIELD | Source: Ambulatory Visit | Attending: Vascular Surgery | Admitting: Vascular Surgery

## 2020-08-12 ENCOUNTER — Encounter (HOSPITAL_COMMUNITY): Payer: Self-pay

## 2020-08-12 DIAGNOSIS — Z01812 Encounter for preprocedural laboratory examination: Secondary | ICD-10-CM | POA: Insufficient documentation

## 2020-08-12 HISTORY — DX: Pneumonia, unspecified organism: J18.9

## 2020-08-12 HISTORY — DX: Personal history of urinary calculi: Z87.442

## 2020-08-12 HISTORY — DX: Prediabetes: R73.03

## 2020-08-12 LAB — COMPREHENSIVE METABOLIC PANEL
ALT: 14 U/L (ref 0–44)
AST: 28 U/L (ref 15–41)
Albumin: 3.8 g/dL (ref 3.5–5.0)
Alkaline Phosphatase: 90 U/L (ref 38–126)
Anion gap: 11 (ref 5–15)
BUN: 7 mg/dL — ABNORMAL LOW (ref 8–23)
CO2: 25 mmol/L (ref 22–32)
Calcium: 9.4 mg/dL (ref 8.9–10.3)
Chloride: 97 mmol/L — ABNORMAL LOW (ref 98–111)
Creatinine, Ser: 0.54 mg/dL — ABNORMAL LOW (ref 0.61–1.24)
GFR, Estimated: >60 mL/min (ref >60)
Glucose, Bld: 91 mg/dL (ref 70–99)
Potassium: 3.6 mmol/L (ref 3.5–5.1)
Sodium: 133 mmol/L — ABNORMAL LOW (ref 135–145)
Total Bilirubin: 0.6 mg/dL (ref 0.3–1.2)
Total Protein: 7.1 g/dL (ref 6.5–8.1)

## 2020-08-12 LAB — URINALYSIS, ROUTINE W REFLEX MICROSCOPIC
Bilirubin Urine: NEGATIVE
Glucose, UA: NEGATIVE mg/dL
Hgb urine dipstick: NEGATIVE
Ketones, ur: NEGATIVE mg/dL
Leukocytes,Ua: NEGATIVE
Nitrite: NEGATIVE
Protein, ur: NEGATIVE mg/dL
Specific Gravity, Urine: 1.015 (ref 1.005–1.030)
pH: 6.5 (ref 5.0–8.0)

## 2020-08-12 LAB — CBC
HCT: 46.4 % (ref 39.0–52.0)
Hemoglobin: 14.4 g/dL (ref 13.0–17.0)
MCH: 27.9 pg (ref 26.0–34.0)
MCHC: 31 g/dL (ref 30.0–36.0)
MCV: 89.7 fL (ref 80.0–100.0)
Platelets: 355 10*3/uL (ref 150–400)
RBC: 5.17 MIL/uL (ref 4.22–5.81)
RDW: 21.2 % — ABNORMAL HIGH (ref 11.5–15.5)
WBC: 6.4 10*3/uL (ref 4.0–10.5)
nRBC: 0 % (ref 0.0–0.2)

## 2020-08-12 LAB — APTT: aPTT: 30 seconds (ref 24–36)

## 2020-08-12 LAB — TYPE AND SCREEN
ABO/RH(D): O POS
Antibody Screen: NEGATIVE

## 2020-08-12 LAB — GLUCOSE, CAPILLARY: Glucose-Capillary: 96 mg/dL (ref 70–99)

## 2020-08-12 LAB — PROTIME-INR
INR: 0.9 (ref 0.8–1.2)
Prothrombin Time: 12.2 seconds (ref 11.4–15.2)

## 2020-08-12 LAB — SURGICAL PCR SCREEN
MRSA, PCR: NEGATIVE
Staphylococcus aureus: POSITIVE — AB

## 2020-08-12 NOTE — Progress Notes (Signed)
PCP - patient denies Cardiologist - patient denies  PPM/ICD - n/a Device Orders -  Rep Notified -   Chest x-ray - 06/27/20 EKG - 06/23/20 Stress Test - patient denies ECHO - patient denies Cardiac Cath - patient denies  Sleep Study - patient denies CPAP -   Fasting Blood Sugar - n/a Checks Blood Sugar _____ times a day  Blood Thinner Instructions: Aspirin Instructions: continue aspirin and take morning of surgery per Trinna Post, RN at [facility]  ERAS Protcol - NPO after midnight PRE-SURGERY Ensure or G2-   COVID TEST- 08/13/20   Anesthesia review: yes, recent admission (06/2020) for pneumonia  Patient denies shortness of breath, fever, cough and chest pain at PAT appointment   All instructions explained to the patient, with a verbal understanding of the material. Patient agrees to go over the instructions while at home for a better understanding. Patient also instructed to self quarantine after being tested for COVID-19. The opportunity to ask questions was provided.

## 2020-08-13 ENCOUNTER — Other Ambulatory Visit (HOSPITAL_COMMUNITY)
Admission: RE | Admit: 2020-08-13 | Discharge: 2020-08-13 | Disposition: A | Discharge status: Home / Self Care | Payer: BLUE CROSS/BLUE SHIELD | Source: Ambulatory Visit | Attending: Vascular Surgery | Admitting: Vascular Surgery

## 2020-08-13 DIAGNOSIS — Z01812 Encounter for preprocedural laboratory examination: Secondary | ICD-10-CM | POA: Insufficient documentation

## 2020-08-13 DIAGNOSIS — Z20822 Contact with and (suspected) exposure to covid-19: Secondary | ICD-10-CM | POA: Insufficient documentation

## 2020-08-13 LAB — SARS CORONAVIRUS 2 (TAT 6-24 HRS): SARS Coronavirus 2: NEGATIVE

## 2020-08-13 NOTE — Anesthesia Preprocedure Evaluation (Addendum)
Anesthesia Evaluation  Patient identified by MRN, date of birth, ID band Patient awake    Reviewed: Allergy & Precautions, NPO status , Patient's Chart, lab work & pertinent test results  History of Anesthesia Complications Negative for: history of anesthetic complications  Airway Mallampati: I  TM Distance: >3 FB Neck ROM: Full    Dental  (+) Dental Advisory Given   Pulmonary COPD,  COPD inhaler, Current Smoker and Patient abstained from smoking.,  08/13/2020 SARS coronavirus NEG 06/2020 empyema   breath sounds clear to auscultation       Cardiovascular hypertension, Pt. on medications (-) angina+ Peripheral Vascular Disease   Rhythm:Regular Rate:Normal     Neuro/Psych negative neurological ROS     GI/Hepatic negative GI ROS, (+)     substance abuse  alcohol use,   Endo/Other  negative endocrine ROS  Renal/GU negative Renal ROS     Musculoskeletal   Abdominal   Peds  Hematology negative hematology ROS (+)   Anesthesia Other Findings   Reproductive/Obstetrics                           Anesthesia Physical Anesthesia Plan  ASA: III  Anesthesia Plan: General   Post-op Pain Management:    Induction: Intravenous  PONV Risk Score and Plan: 1 and Ondansetron and Dexamethasone  Airway Management Planned: Oral ETT  Additional Equipment: None  Intra-op Plan:   Post-operative Plan: Extubation in OR  Informed Consent: I have reviewed the patients History and Physical, chart, labs and discussed the procedure including the risks, benefits and alternatives for the proposed anesthesia with the patient or authorized representative who has indicated his/her understanding and acceptance.     Dental advisory given  Plan Discussed with: CRNA and Surgeon  Anesthesia Plan Comments: (PAT note by Antionette Poles, PA-C: Patient was admitted 06/20/2020 through 06/28/2020 for peripheral vascular  disease with right foot ulcers as well as sepsis due to pneumonia, right-sided empyema, multiple cavitary lesions on chest CT. Patient reported chronic cough productive of foul-smelling sputum past 6 months.CT chest with contrast notable for multiple areas of cavitation throughout the right lung, predominantly right upper lobe with several areas of the cavitation thick-walled and peripheral enhancing concerning for abscess.   QuantiFERON gold and AFB negative.  Patient initially started on empiric antibiotics with vancomycin and meropenem.  Underwent IR guided right thoracentesis with 15 mL of clear yellow fluid obtained. Underwent IR CT-guided chest tube placement on 06/24/2020 with 2 rounds of TPA/dornase per PCCM to break up loculated empyema.  Patient had good output from his chest tube with repeat CT chest on 06/27/2020 with decreased right posterior empyema.  Pleural fluid cultures from 06/22/2020 and 06/24/2020 with no growth; although antibiotics were initiated prior to cultures being obtained.  Chest tube was removed on 06/27/2020 without issue.  Patient's antibiotics were deescalated to Augmentin and will need to complete a 6-week course per pulmonology.  Per discharge summary, patient was set up for outpatient follow-up with pulmonology, however he did not follow through with this.  He was also recommended to have a follow-up CT 3 to 6 months to exclude malignancy.    He was evaluated by vascular surgery and based on results of angiography it was recommended he undergo femoropopliteal bypass grafting.  Cardiology was consulted for preop cardiac evaluation.  Per consult note by Dr. Antoine Poche 06/27/2020 during admission, "PREOP: The patient has no high risk symptoms and physical findings other than his palpable  vascular disease.  He has a very high functional level without any symptoms.  Given this, according to ACC/AHA guidelines.  No further testing is indicated preop.  The patient would be at  acceptable risk for surgery.  PVD:  He needs risk reduction.  Lipids have been drawn.  I would suggest that he should be on a statin unless his LDL is much lower than the goal of 70.  He clearly needs to stop smoking and we discussed this today."  Dr. Antoine Poche did recommend outpatient cardiology follow-up however patient has not followed through with this.  Patient was seen at PAT appointment by Clovis Cao, RN.  She noted that the patient was doing well clinically, no shortness of breath, no cough, no cardiopulmonary complaints.  Patient history reviewed with Dr. Jean Rosenthal.  She advised the patient could proceed as planned given nature of procedure.  Recommended he use his albuterol inhaler prior to coming to the hospital.  I called and spoke with the patient to relay this message.  He also confirmed to me that he is overall feeling well from a cardiopulmonary standpoint, says he has had only very mild, infrequent cough since discharge from the hospital.  Says his breathing is better than it has been in a long time.  He has not required use of albuterol inhaler.  He does understand the need for follow-up with pulmonology as recommended in discharge summary.  He says he will work on scheduling this.  Preop labs reviewed, unremarkable.  EKG 06/20/2020: Sinus tachycardia. Rate 103. Nonspecific ST abnormality  CT chest 06/27/2020: IMPRESSION: 1. Decreased right posterior empyema status post chest tube drainage. There are a few other small areas of loculated, non-drainable pleural fluid along the lateral right upper lobe and lateral right lower lobe that are unchanged. 2. Interval improvement in scattered thick-walled cavitary lesions and peribronchial thickening throughout the right lung, consistent with resolving necrotic pneumonia. 3. Unchanged architectural distortion, traction bronchiectasis, and areas of spiculation in the right upper lobe. While potentially related to current or prior  infection, follow-up chest CT in 3-6 months is recommended to exclude underlying malignancy. 4. Unchanged mediastinal and right hilar lymphadenopathy, likely reactive. 5. Aortic Atherosclerosis (ICD10-I70.0).  )      Anesthesia Quick Evaluation

## 2020-08-13 NOTE — Progress Notes (Signed)
Anesthesia Chart Review:  Patient was admitted 06/20/2020 through 06/28/2020 for peripheral vascular disease with right foot ulcers as well as sepsis due to pneumonia, right-sided empyema, multiple cavitary lesions on chest CT. Patient reported chronic cough productive of foul-smelling sputum past 6 months.CT chest with contrast notable for multiple areas of cavitation throughout the right lung, predominantly right upper lobe with several areas of the cavitation thick-walled and peripheral enhancing concerning for abscess.   QuantiFERON gold and AFB negative.  Patient initially started on empiric antibiotics with vancomycin and meropenem.  Underwent IR guided right thoracentesis with 15 mL of clear yellow fluid obtained. Underwent IR CT-guided chest tube placement on 06/24/2020 with 2 rounds of TPA/dornase per PCCM to break up loculated empyema.  Patient had good output from his chest tube with repeat CT chest on 06/27/2020 with decreased right posterior empyema.  Pleural fluid cultures from 06/22/2020 and 06/24/2020 with no growth; although antibiotics were initiated prior to cultures being obtained.  Chest tube was removed on 06/27/2020 without issue.  Patient's antibiotics were deescalated to Augmentin and will need to complete a 6-week course per pulmonology.  Per discharge summary, patient was set up for outpatient follow-up with pulmonology, however he did not follow through with this.  He was also recommended to have a follow-up CT 3 to 6 months to exclude malignancy.    He was evaluated by vascular surgery and based on results of angiography it was recommended he undergo femoropopliteal bypass grafting.  Cardiology was consulted for preop cardiac evaluation.  Per consult note by Dr. Antoine Poche 06/27/2020 during admission, "PREOP: The patient has no high risk symptoms and physical findings other than his palpable vascular disease.  He has a very high functional level without any symptoms.  Given this,  according to ACC/AHA guidelines.  No further testing is indicated preop.  The patient would be at acceptable risk for surgery.  PVD:  He needs risk reduction.  Lipids have been drawn.  I would suggest that he should be on a statin unless his LDL is much lower than the goal of 70.  He clearly needs to stop smoking and we discussed this today."  Dr. Antoine Poche did recommend outpatient cardiology follow-up however patient has not followed through with this.  Patient was seen at PAT appointment by Clovis Cao, RN.  She noted that the patient was doing well clinically, no shortness of breath, no cough, no cardiopulmonary complaints.  Patient history reviewed with Dr. Jean Rosenthal.  She advised the patient could proceed as planned given nature of procedure.  Recommended he use his albuterol inhaler prior to coming to the hospital.  I called and spoke with the patient to relay this message.  He also confirmed to me that he is overall feeling well from a cardiopulmonary standpoint, says he has had only very mild, infrequent cough since discharge from the hospital.  Says his breathing is better than it has been in a long time.  He has not required use of albuterol inhaler.  He does understand the need for follow-up with pulmonology as recommended in discharge summary.  He says he will work on scheduling this.  Preop labs reviewed, unremarkable.  EKG 06/20/2020: Sinus tachycardia. Rate 103. Nonspecific ST abnormality  CT chest 06/27/2020: IMPRESSION: 1. Decreased right posterior empyema status post chest tube drainage. There are a few other small areas of loculated, non-drainable pleural fluid along the lateral right upper lobe and lateral right lower lobe that are unchanged. 2. Interval improvement in scattered thick-walled  cavitary lesions and peribronchial thickening throughout the right lung, consistent with resolving necrotic pneumonia. 3. Unchanged architectural distortion, traction bronchiectasis,  and areas of spiculation in the right upper lobe. While potentially related to current or prior infection, follow-up chest CT in 3-6 months is recommended to exclude underlying malignancy. 4. Unchanged mediastinal and right hilar lymphadenopathy, likely reactive. 5. Aortic Atherosclerosis (ICD10-I70.0).   Zannie Cove [facility] Short Stay Center/Anesthesiology Phone [phone] 08/13/2020 12:49 PM

## 2020-08-14 ENCOUNTER — Encounter (HOSPITAL_COMMUNITY)
Admission: RE | Disposition: A | Discharge status: Home / Self Care | Payer: Self-pay | Source: Ambulatory Visit | Attending: Vascular Surgery

## 2020-08-14 ENCOUNTER — Other Ambulatory Visit: Payer: Self-pay

## 2020-08-14 ENCOUNTER — Encounter (HOSPITAL_COMMUNITY): Payer: Self-pay

## 2020-08-14 ENCOUNTER — Inpatient Hospital Stay (HOSPITAL_COMMUNITY): Payer: BLUE CROSS/BLUE SHIELD | Admitting: Physician Assistant

## 2020-08-14 ENCOUNTER — Inpatient Hospital Stay (HOSPITAL_COMMUNITY): Payer: BLUE CROSS/BLUE SHIELD | Admitting: Anesthesiology

## 2020-08-14 ENCOUNTER — Inpatient Hospital Stay (HOSPITAL_COMMUNITY)
Admission: RE | Admit: 2020-08-14 | Discharge: 2020-08-15 | DRG: 272 | Disposition: A | Discharge status: Home / Self Care | Payer: BLUE CROSS/BLUE SHIELD | Source: Ambulatory Visit | Attending: Vascular Surgery | Admitting: Vascular Surgery

## 2020-08-14 DIAGNOSIS — F17210 Nicotine dependence, cigarettes, uncomplicated: Secondary | ICD-10-CM | POA: Diagnosis present

## 2020-08-14 DIAGNOSIS — R7303 Prediabetes: Secondary | ICD-10-CM | POA: Diagnosis present

## 2020-08-14 DIAGNOSIS — Z20822 Contact with and (suspected) exposure to covid-19: Secondary | ICD-10-CM | POA: Diagnosis present

## 2020-08-14 DIAGNOSIS — I70239 Atherosclerosis of native arteries of right leg with ulceration of unspecified site: Secondary | ICD-10-CM | POA: Diagnosis present

## 2020-08-14 DIAGNOSIS — I70235 Atherosclerosis of native arteries of right leg with ulceration of other part of foot: Secondary | ICD-10-CM | POA: Diagnosis present

## 2020-08-14 DIAGNOSIS — L97512 Non-pressure chronic ulcer of other part of right foot with fat layer exposed: Secondary | ICD-10-CM | POA: Diagnosis present

## 2020-08-14 DIAGNOSIS — I10 Essential (primary) hypertension: Secondary | ICD-10-CM | POA: Diagnosis present

## 2020-08-14 DIAGNOSIS — Z888 Allergy status to other drugs, medicaments and biological substances status: Secondary | ICD-10-CM | POA: Diagnosis not present

## 2020-08-14 DIAGNOSIS — Z803 Family history of malignant neoplasm of breast: Secondary | ICD-10-CM | POA: Diagnosis not present

## 2020-08-14 DIAGNOSIS — I739 Peripheral vascular disease, unspecified: Secondary | ICD-10-CM | POA: Diagnosis present

## 2020-08-14 HISTORY — PX: FEMORAL-POPLITEAL BYPASS GRAFT: SHX937

## 2020-08-14 HISTORY — PX: ENDARTERECTOMY FEMORAL: SHX5804

## 2020-08-14 HISTORY — PX: OTHER SURGICAL HISTORY: SHX169

## 2020-08-14 LAB — GLUCOSE, CAPILLARY: Glucose-Capillary: 113 mg/dL — ABNORMAL HIGH (ref 70–99)

## 2020-08-14 LAB — CREATININE, SERUM
Creatinine, Ser: 0.6 mg/dL — ABNORMAL LOW (ref 0.61–1.24)
GFR, Estimated: >60 mL/min (ref >60)

## 2020-08-14 LAB — CBC
HCT: 40.5 % (ref 39.0–52.0)
Hemoglobin: 12.7 g/dL — ABNORMAL LOW (ref 13.0–17.0)
MCH: 28 pg (ref 26.0–34.0)
MCHC: 31.4 g/dL (ref 30.0–36.0)
MCV: 89.4 fL (ref 80.0–100.0)
Platelets: 311 10*3/uL (ref 150–400)
RBC: 4.53 MIL/uL (ref 4.22–5.81)
RDW: 20.9 % — ABNORMAL HIGH (ref 11.5–15.5)
WBC: 8.5 10*3/uL (ref 4.0–10.5)
nRBC: 0 % (ref 0.0–0.2)

## 2020-08-14 LAB — ABO/RH: ABO/RH(D): O POS

## 2020-08-14 SURGERY — BYPASS GRAFT FEMORAL-POPLITEAL ARTERY
Anesthesia: General | Site: Leg Upper | Laterality: Right

## 2020-08-14 MED ORDER — OXYCODONE HCL 5 MG PO TABS: 5.0000 mg | ORAL_TABLET | Freq: Once | ORAL | Status: DC | PRN

## 2020-08-14 MED ORDER — HEPARIN SODIUM (PORCINE) 5000 UNIT/ML IJ SOLN
5000.0000 [IU] | Freq: Three times a day (TID) | INTRAMUSCULAR | Status: DC
  Administered 2020-08-15: 5000 [IU] via SUBCUTANEOUS
  Filled 2020-08-14: qty 1

## 2020-08-14 MED ORDER — HEPARIN SODIUM (PORCINE) 1000 UNIT/ML IJ SOLN
INTRAMUSCULAR | Status: DC | PRN
  Administered 2020-08-14: 6000 [IU] via INTRAVENOUS

## 2020-08-14 MED ORDER — MIDAZOLAM HCL 2 MG/2ML IJ SOLN
INTRAMUSCULAR | Status: AC
  Filled 2020-08-14: qty 2

## 2020-08-14 MED ORDER — ALBUTEROL SULFATE HFA 108 (90 BASE) MCG/ACT IN AERS
1.0000 | INHALATION_SPRAY | Freq: Four times a day (QID) | RESPIRATORY_TRACT | Status: DC | PRN
  Filled 2020-08-14: qty 6.7

## 2020-08-14 MED ORDER — 0.9 % SODIUM CHLORIDE (POUR BTL) OPTIME
TOPICAL | Status: DC | PRN
  Administered 2020-08-14: 1000 mL

## 2020-08-14 MED ORDER — PHENYLEPHRINE HCL-NACL 10-0.9 MG/250ML-% IV SOLN
INTRAVENOUS | Status: AC
  Filled 2020-08-14: qty 500

## 2020-08-14 MED ORDER — MAGNESIUM SULFATE 2 GM/50ML IV SOLN: 2.0000 g | Freq: Every day | INTRAVENOUS | Status: DC | PRN

## 2020-08-14 MED ORDER — ACETAMINOPHEN 325 MG PO TABS: 325.0000 mg | ORAL_TABLET | ORAL | Status: DC | PRN

## 2020-08-14 MED ORDER — PANTOPRAZOLE SODIUM 40 MG PO TBEC
40.0000 mg | DELAYED_RELEASE_TABLET | Freq: Every day | ORAL | Status: DC
  Administered 2020-08-14 – 2020-08-15 (×2): 40 mg via ORAL
  Filled 2020-08-14 (×2): qty 1

## 2020-08-14 MED ORDER — DOCUSATE SODIUM 100 MG PO CAPS
100.0000 mg | ORAL_CAPSULE | Freq: Every day | ORAL | Status: DC
  Administered 2020-08-15: 100 mg via ORAL
  Filled 2020-08-14: qty 1

## 2020-08-14 MED ORDER — CHLORHEXIDINE GLUCONATE CLOTH 2 % EX PADS: 6.0000 | MEDICATED_PAD | Freq: Once | CUTANEOUS | Status: DC

## 2020-08-14 MED ORDER — MORPHINE SULFATE (PF) 2 MG/ML IV SOLN: 2.0000 mg | INTRAVENOUS | Status: DC | PRN

## 2020-08-14 MED ORDER — SODIUM CHLORIDE 0.9 % IV SOLN: INTRAVENOUS | Status: DC

## 2020-08-14 MED ORDER — HEPARIN SODIUM (PORCINE) 1000 UNIT/ML IJ SOLN
INTRAMUSCULAR | Status: AC
  Filled 2020-08-14: qty 1

## 2020-08-14 MED ORDER — POLYETHYLENE GLYCOL 3350 17 G PO PACK: 17.0000 g | PACK | Freq: Every day | ORAL | Status: DC | PRN

## 2020-08-14 MED ORDER — LIDOCAINE 2% (20 MG/ML) 5 ML SYRINGE
INTRAMUSCULAR | Status: AC
  Filled 2020-08-14: qty 5

## 2020-08-14 MED ORDER — ONDANSETRON HCL 4 MG/2ML IJ SOLN
INTRAMUSCULAR | Status: AC
  Filled 2020-08-14: qty 2

## 2020-08-14 MED ORDER — PHENYLEPHRINE 40 MCG/ML (10ML) SYRINGE FOR IV PUSH (FOR BLOOD PRESSURE SUPPORT)
PREFILLED_SYRINGE | INTRAVENOUS | Status: DC | PRN
  Administered 2020-08-14: 40 ug via INTRAVENOUS
  Administered 2020-08-14: 120 ug via INTRAVENOUS
  Administered 2020-08-14 (×2): 80 ug via INTRAVENOUS

## 2020-08-14 MED ORDER — OXYCODONE HCL 5 MG/5ML PO SOLN
5.0000 mg | Freq: Once | ORAL | Status: DC | PRN
Start: 2020-08-14 — End: 2020-08-14

## 2020-08-14 MED ORDER — POTASSIUM CHLORIDE CRYS ER 20 MEQ PO TBCR
20.0000 meq | EXTENDED_RELEASE_TABLET | Freq: Every day | ORAL | Status: DC | PRN
Start: 2020-08-14 — End: 2020-08-15

## 2020-08-14 MED ORDER — OXYCODONE HCL 5 MG PO TABS
5.0000 mg | ORAL_TABLET | ORAL | Status: DC | PRN
  Administered 2020-08-14: 10 mg via ORAL
  Filled 2020-08-14: qty 2

## 2020-08-14 MED ORDER — LABETALOL HCL 5 MG/ML IV SOLN: 10.0000 mg | INTRAVENOUS | Status: DC | PRN

## 2020-08-14 MED ORDER — ALUM & MAG HYDROXIDE-SIMETH 200-200-20 MG/5ML PO SUSP: 15.0000 mL | ORAL | Status: DC | PRN

## 2020-08-14 MED ORDER — DEXAMETHASONE SODIUM PHOSPHATE 10 MG/ML IJ SOLN
INTRAMUSCULAR | Status: AC
  Filled 2020-08-14: qty 1

## 2020-08-14 MED ORDER — ACETAMINOPHEN 650 MG RE SUPP: 325.0000 mg | RECTAL | Status: DC | PRN

## 2020-08-14 MED ORDER — BISACODYL 10 MG RE SUPP: 10.0000 mg | Freq: Every day | RECTAL | Status: DC | PRN

## 2020-08-14 MED ORDER — DEXMEDETOMIDINE (PRECEDEX) IN NS 20 MCG/5ML (4 MCG/ML) IV SYRINGE
PREFILLED_SYRINGE | INTRAVENOUS | Status: DC | PRN
  Administered 2020-08-14: 8 ug via INTRAVENOUS

## 2020-08-14 MED ORDER — LIDOCAINE 2% (20 MG/ML) 5 ML SYRINGE
INTRAMUSCULAR | Status: DC | PRN
  Administered 2020-08-14: 20 mg via INTRAVENOUS

## 2020-08-14 MED ORDER — CEFAZOLIN SODIUM-DEXTROSE 2-4 GM/100ML-% IV SOLN
2.0000 g | Freq: Three times a day (TID) | INTRAVENOUS | Status: AC
  Administered 2020-08-14 (×2): 2 g via INTRAVENOUS
  Filled 2020-08-14 (×2): qty 100

## 2020-08-14 MED ORDER — MEPERIDINE HCL 25 MG/ML IJ SOLN: 6.2500 mg | INTRAMUSCULAR | Status: DC | PRN

## 2020-08-14 MED ORDER — FERROUS SULFATE 325 (65 FE) MG PO TABS
325.0000 mg | ORAL_TABLET | Freq: Two times a day (BID) | ORAL | Status: DC
  Administered 2020-08-14 – 2020-08-15 (×3): 325 mg via ORAL
  Filled 2020-08-14 (×3): qty 1

## 2020-08-14 MED ORDER — HYDROMORPHONE HCL 1 MG/ML IJ SOLN: 0.2500 mg | INTRAMUSCULAR | Status: DC | PRN

## 2020-08-14 MED ORDER — GUAIFENESIN-DM 100-10 MG/5ML PO SYRP: 15.0000 mL | ORAL_SOLUTION | ORAL | Status: DC | PRN

## 2020-08-14 MED ORDER — MIDAZOLAM HCL 5 MG/5ML IJ SOLN
INTRAMUSCULAR | Status: DC | PRN
  Administered 2020-08-14: 2 mg via INTRAVENOUS

## 2020-08-14 MED ORDER — PHENYLEPHRINE HCL-NACL 10-0.9 MG/250ML-% IV SOLN
INTRAVENOUS | Status: DC | PRN
  Administered 2020-08-14: 40 ug/min via INTRAVENOUS

## 2020-08-14 MED ORDER — PHENYLEPHRINE HCL (PRESSORS) 10 MG/ML IV SOLN: INTRAVENOUS | Status: DC | PRN

## 2020-08-14 MED ORDER — ROCURONIUM BROMIDE 10 MG/ML (PF) SYRINGE
PREFILLED_SYRINGE | INTRAVENOUS | Status: AC
  Filled 2020-08-14: qty 10

## 2020-08-14 MED ORDER — ONDANSETRON HCL 4 MG/2ML IJ SOLN: 4.0000 mg | Freq: Four times a day (QID) | INTRAMUSCULAR | Status: DC | PRN

## 2020-08-14 MED ORDER — ROCURONIUM BROMIDE 100 MG/10ML IV SOLN
INTRAVENOUS | Status: DC | PRN
  Administered 2020-08-14: 60 mg via INTRAVENOUS
  Administered 2020-08-14 (×2): 10 mg via INTRAVENOUS

## 2020-08-14 MED ORDER — DEXAMETHASONE SODIUM PHOSPHATE 10 MG/ML IJ SOLN
INTRAMUSCULAR | Status: DC | PRN
  Administered 2020-08-14: 5 mg via INTRAVENOUS

## 2020-08-14 MED ORDER — HYDROCHLOROTHIAZIDE 25 MG PO TABS
25.0000 mg | ORAL_TABLET | Freq: Every day | ORAL | Status: DC
  Administered 2020-08-14 – 2020-08-15 (×2): 25 mg via ORAL
  Filled 2020-08-14 (×2): qty 1

## 2020-08-14 MED ORDER — PHENOL 1.4 % MT LIQD: 1.0000 | OROMUCOSAL | Status: DC | PRN

## 2020-08-14 MED ORDER — KETAMINE HCL 10 MG/ML IJ SOLN
INTRAMUSCULAR | Status: DC | PRN
  Administered 2020-08-14: 20 mg via INTRAVENOUS
  Administered 2020-08-14: 10 mg via INTRAVENOUS

## 2020-08-14 MED ORDER — PROPOFOL 10 MG/ML IV BOLUS
INTRAVENOUS | Status: DC | PRN
Start: 2020-08-14 — End: 2020-08-14
  Administered 2020-08-14: 100 mg via INTRAVENOUS
  Administered 2020-08-14: 50 mg via INTRAVENOUS
  Administered 2020-08-14: 20 mg via INTRAVENOUS

## 2020-08-14 MED ORDER — ASPIRIN EC 81 MG PO TBEC
81.0000 mg | DELAYED_RELEASE_TABLET | Freq: Every day | ORAL | Status: DC
Start: 2020-08-14 — End: 2020-08-15
  Administered 2020-08-14 – 2020-08-15 (×2): 81 mg via ORAL
  Filled 2020-08-14 (×2): qty 1

## 2020-08-14 MED ORDER — METOPROLOL TARTRATE 5 MG/5ML IV SOLN
2.0000 mg | INTRAVENOUS | Status: DC | PRN
Start: 2020-08-14 — End: 2020-08-15
  Administered 2020-08-14: 5 mg via INTRAVENOUS
  Filled 2020-08-14: qty 5

## 2020-08-14 MED ORDER — CEFAZOLIN SODIUM-DEXTROSE 2-4 GM/100ML-% IV SOLN
2.0000 g | INTRAVENOUS | Status: AC
  Administered 2020-08-14: 2 g via INTRAVENOUS
  Filled 2020-08-14: qty 100

## 2020-08-14 MED ORDER — ROSUVASTATIN CALCIUM 5 MG PO TABS: 5.0000 mg | ORAL_TABLET | Freq: Every day | ORAL | Status: DC

## 2020-08-14 MED ORDER — HYDRALAZINE HCL 20 MG/ML IJ SOLN: 5.0000 mg | INTRAMUSCULAR | Status: DC | PRN

## 2020-08-14 MED ORDER — DIPHENHYDRAMINE HCL 25 MG PO CAPS
25.0000 mg | ORAL_CAPSULE | Freq: Two times a day (BID) | ORAL | Status: DC
  Administered 2020-08-14 – 2020-08-15 (×2): 25 mg via ORAL
  Filled 2020-08-14 (×3): qty 1

## 2020-08-14 MED ORDER — MORPHINE SULFATE (PF) 4 MG/ML IV SOLN: 4.0000 mg | INTRAVENOUS | Status: DC | PRN

## 2020-08-14 MED ORDER — HEMOSTATIC AGENTS (NO CHARGE) OPTIME
TOPICAL | Status: DC | PRN
  Administered 2020-08-14: 1 via TOPICAL

## 2020-08-14 MED ORDER — LACTATED RINGERS IV SOLN: INTRAVENOUS | Status: DC | PRN

## 2020-08-14 MED ORDER — MIDAZOLAM HCL 2 MG/2ML IJ SOLN: 0.5000 mg | Freq: Once | INTRAMUSCULAR | Status: DC | PRN

## 2020-08-14 MED ORDER — ACETAMINOPHEN 325 MG PO TABS
650.0000 mg | ORAL_TABLET | Freq: Four times a day (QID) | ORAL | Status: DC
  Filled 2020-08-14 (×2): qty 2

## 2020-08-14 MED ORDER — ROSUVASTATIN CALCIUM 20 MG PO TABS
20.0000 mg | ORAL_TABLET | Freq: Every day | ORAL | Status: DC
  Administered 2020-08-14 – 2020-08-15 (×2): 20 mg via ORAL
  Filled 2020-08-14 (×2): qty 1

## 2020-08-14 MED ORDER — SUGAMMADEX SODIUM 200 MG/2ML IV SOLN
INTRAVENOUS | Status: DC | PRN
Start: 2020-08-14 — End: 2020-08-14
  Administered 2020-08-14: 200 mg via INTRAVENOUS

## 2020-08-14 MED ORDER — PHENYLEPHRINE 40 MCG/ML (10ML) SYRINGE FOR IV PUSH (FOR BLOOD PRESSURE SUPPORT)
PREFILLED_SYRINGE | INTRAVENOUS | Status: AC
  Filled 2020-08-14: qty 10

## 2020-08-14 MED ORDER — CHLORHEXIDINE GLUCONATE 0.12 % MT SOLN
OROMUCOSAL | Status: AC
  Administered 2020-08-14: 15 mL
  Filled 2020-08-14: qty 15

## 2020-08-14 MED ORDER — OXYCODONE-ACETAMINOPHEN 5-325 MG PO TABS
1.0000 | ORAL_TABLET | ORAL | Status: DC | PRN
Start: 2020-08-14 — End: 2020-08-15
  Administered 2020-08-14 – 2020-08-15 (×3): 2 via ORAL
  Filled 2020-08-14 (×3): qty 2

## 2020-08-14 MED ORDER — ONDANSETRON HCL 4 MG/2ML IJ SOLN
INTRAMUSCULAR | Status: DC | PRN
  Administered 2020-08-14: 4 mg via INTRAVENOUS

## 2020-08-14 MED ORDER — FENTANYL CITRATE (PF) 100 MCG/2ML IJ SOLN
INTRAMUSCULAR | Status: DC | PRN
  Administered 2020-08-14: 50 ug via INTRAVENOUS
  Administered 2020-08-14: 25 ug via INTRAVENOUS
  Administered 2020-08-14: 100 ug via INTRAVENOUS
  Administered 2020-08-14: 25 ug via INTRAVENOUS

## 2020-08-14 MED ORDER — KETAMINE HCL 50 MG/5ML IJ SOSY
PREFILLED_SYRINGE | INTRAMUSCULAR | Status: AC
  Filled 2020-08-14: qty 5

## 2020-08-14 MED ORDER — SODIUM CHLORIDE 0.9 % IV SOLN: 500.0000 mL | Freq: Once | INTRAVENOUS | Status: DC | PRN

## 2020-08-14 MED ORDER — SODIUM CHLORIDE 0.9 % IV SOLN
INTRAVENOUS | Status: DC | PRN
  Administered 2020-08-14: 500 mL

## 2020-08-14 MED ORDER — FENTANYL CITRATE (PF) 250 MCG/5ML IJ SOLN
INTRAMUSCULAR | Status: AC
  Filled 2020-08-14: qty 5

## 2020-08-14 MED ORDER — PROMETHAZINE HCL 25 MG/ML IJ SOLN: 6.2500 mg | INTRAMUSCULAR | Status: DC | PRN

## 2020-08-14 MED ORDER — PROTAMINE SULFATE 10 MG/ML IV SOLN
INTRAVENOUS | Status: DC | PRN
  Administered 2020-08-14: 30 mg via INTRAVENOUS

## 2020-08-14 SURGICAL SUPPLY — 57 items
BANDAGE ESMARK 6X9 LF (GAUZE/BANDAGES/DRESSINGS) ×2 IMPLANT
BENZOIN TINCTURE PRP APPL 2/3 (GAUZE/BANDAGES/DRESSINGS) ×9 IMPLANT
BNDG ESMARK 6X9 LF (GAUZE/BANDAGES/DRESSINGS) ×3
CANISTER SUCT 3000ML PPV (MISCELLANEOUS) ×3 IMPLANT
CANNULA VESSEL 3MM 2 BLNT TIP (CANNULA) IMPLANT
CHLORAPREP W/TINT 26 (MISCELLANEOUS) ×6 IMPLANT
CLIP VESOCCLUDE MED 24/CT (CLIP) ×3 IMPLANT
CLIP VESOCCLUDE SM WIDE 24/CT (CLIP) ×3 IMPLANT
CUFF TOURN SGL QUICK 24 (TOURNIQUET CUFF)
CUFF TOURN SGL QUICK 34 (TOURNIQUET CUFF)
CUFF TOURN SGL QUICK 42 (TOURNIQUET CUFF) IMPLANT
CUFF TRNQT CYL 24X4X16.5-23 (TOURNIQUET CUFF) IMPLANT
CUFF TRNQT CYL 34X4.125X (TOURNIQUET CUFF) IMPLANT
DRAIN CHANNEL 15F RND FF W/TCR (WOUND CARE) IMPLANT
DRAIN PENROSE 1/4X12 LTX STRL (WOUND CARE) IMPLANT
DRAPE C-ARM 42X72 X-RAY (DRAPES) IMPLANT
DRAPE HALF SHEET 40X57 (DRAPES) IMPLANT
DRAPE X-RAY CASS 24X20 (DRAPES) IMPLANT
ELECT REM PT RETURN 9FT ADLT (ELECTROSURGICAL) ×3
ELECTRODE REM PT RTRN 9FT ADLT (ELECTROSURGICAL) ×2 IMPLANT
EVACUATOR SILICONE 100CC (DRAIN) IMPLANT
FILTER SMOKE EVAC ULPA (FILTER) IMPLANT
GAUZE SPONGE 4X4 12PLY STRL (GAUZE/BANDAGES/DRESSINGS) ×3 IMPLANT
GLOVE SURG SS PI 8.0 STRL IVOR (GLOVE) ×3 IMPLANT
GOWN STRL REUS W/ TWL LRG LVL3 (GOWN DISPOSABLE) ×4 IMPLANT
GOWN STRL REUS W/ TWL XL LVL3 (GOWN DISPOSABLE) ×2 IMPLANT
GOWN STRL REUS W/TWL LRG LVL3 (GOWN DISPOSABLE) ×2
GOWN STRL REUS W/TWL XL LVL3 (GOWN DISPOSABLE) ×1
GRAFT PROPATEN W/RING 6X80X60 (Vascular Products) ×3 IMPLANT
HEMOSTAT SURGICEL 2X14 (HEMOSTASIS) ×3 IMPLANT
INSERT FOGARTY SM (MISCELLANEOUS) ×3 IMPLANT
KIT BASIN OR (CUSTOM PROCEDURE TRAY) ×3 IMPLANT
KIT TURNOVER KIT B (KITS) ×3 IMPLANT
MARKER GRAFT CORONARY BYPASS (MISCELLANEOUS) IMPLANT
NS IRRIG 1000ML POUR BTL (IV SOLUTION) ×6 IMPLANT
PACK PERIPHERAL VASCULAR (CUSTOM PROCEDURE TRAY) ×3 IMPLANT
PAD ARMBOARD 7.5X6 YLW CONV (MISCELLANEOUS) ×6 IMPLANT
PENCIL SMOKE EVACUATOR (MISCELLANEOUS) ×3 IMPLANT
SET COLLECT BLD 21X3/4 12 (NEEDLE) IMPLANT
STOPCOCK 4 WAY LG BORE MALE ST (IV SETS) IMPLANT
STRIP CLOSURE SKIN 1/2X4 (GAUZE/BANDAGES/DRESSINGS) ×9 IMPLANT
SUT ETHILON 3 0 PS 1 (SUTURE) IMPLANT
SUT MNCRL AB 4-0 PS2 18 (SUTURE) ×6 IMPLANT
SUT PROLENE 5 0 C 1 24 (SUTURE) ×6 IMPLANT
SUT PROLENE 6 0 BV (SUTURE) ×6 IMPLANT
SUT SILK 2 0 SH (SUTURE) ×3 IMPLANT
SUT SILK 3 0 (SUTURE) ×1
SUT SILK 3-0 18XBRD TIE 12 (SUTURE) ×2 IMPLANT
SUT VIC AB 2-0 CT1 27 (SUTURE) ×2
SUT VIC AB 2-0 CT1 TAPERPNT 27 (SUTURE) ×4 IMPLANT
SUT VIC AB 2-0 SH 18 (SUTURE) ×3 IMPLANT
SUT VIC AB 3-0 SH 27 (SUTURE) ×2
SUT VIC AB 3-0 SH 27X BRD (SUTURE) ×4 IMPLANT
TOWEL GREEN STERILE (TOWEL DISPOSABLE) ×3 IMPLANT
TRAY FOLEY MTR SLVR 16FR STAT (SET/KITS/TRAYS/PACK) ×3 IMPLANT
UNDERPAD 30X36 HEAVY ABSORB (UNDERPADS AND DIAPERS) ×3 IMPLANT
WATER STERILE IRR 1000ML POUR (IV SOLUTION) ×3 IMPLANT

## 2020-08-14 NOTE — Transfer of Care (Cosign Needed)
Immediate Anesthesia Transfer of Care Note  Patient: Kirk Carrillo  Procedure(s) Performed: RIGHT COMMON FEMORAL TO RIGHT BELOW KNEE POPLITEAL BYPASS GRAFT (Right Leg Upper) RIGHT ILIOFEMORAL ENDARTERECTOMY (Right Groin)  Patient Location: PACU  Anesthesia Type:General  Level of Consciousness: awake and alert   Airway & Oxygen Therapy: Patient Spontanous Breathing and Patient connected to face mask oxygen  Post-op Assessment: Report given to RN and Post -op Vital signs reviewed and stable  Post vital signs: Reviewed and stable  Last Vitals:  Vitals Value Taken Time  BP 106/77 08/14/20 1038  Temp    Pulse 95 08/14/20 1039  Resp 14 08/14/20 1039  SpO2 100 % 08/14/20 1039  Vitals shown include unvalidated device data.  Last Pain:  Vitals:   08/14/20 0613  TempSrc:   PainSc: 9          Complications: No complications documented.

## 2020-08-14 NOTE — Progress Notes (Signed)
  Day of Surgery Note    Subjective:  Has some soreness in his incisions.  Says he has already walked down the hallway.   Vitals:   08/14/20 1131 08/14/20 1153  BP: (!) 130/94 (!) 146/88  Pulse:    Resp: 16   Temp: 97.6 F (36.4 C) 97.7 F (36.5 C)  SpO2: 96%     Incisions:   Right groin and right below knee incisions are bandaged and are clean.  Extremities:  Brisk right DP/peroneal doppler signals Cardiac:  regular Lungs:  Non labored    Assessment/Plan:  This is a 60 y.o. male who is s/p  1) right iliofemoral endarterectomy 2) right femoral to below-knee popliteal bypass with 6 mm ePTFE  -pt doing well on the floor-his bypass is patent with brisk doppler signals right DP/peroneal -he has already walked in the hallways -will d/c foley this afternoon -possible discharge tomorrow -dry dressing to right toe ulcer   Doreatha Massed, PA-C 08/14/2020 4:06 PM [phone]

## 2020-08-14 NOTE — Op Note (Signed)
DATE OF SERVICE: 08/14/2020  PATIENT:  Kirk Carrillo  60 y.o. 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  ANESTHESIA:   general  EBL: 5mL  BLOOD ADMINISTERED:none  DRAINS: none   LOCAL MEDICATIONS USED:  LIDOCAINE   SPECIMEN:  none  COUNTS: confirmed correct.  TOURNIQUET:  None  PATIENT DISPOSITION:  PACU - hemodynamically stable.   Delay start of Pharmacological VTE agent (>24hrs) due to surgical blood loss or risk of bleeding: no  INDICATION FOR PROCEDURE: Kirk Carrillo is a 60 y.o. male with right fourth toe ulceration. After careful discussion of risks, benefits, and alternatives the patient was offered fem-pop bypass. We specifically discussed risk of graft infection and risk of bypass failure. The patient understood and wished to proceed.  OPERATIVE FINDINGS: more extensive iliofemoral plaque than predicted by angiogram. Required endarterectomy. At completion, strongly biphasic PT/Peroneal which nearly disappeared with occlusion of bypass.  DESCRIPTION OF PROCEDURE: After identification of the patient in the pre-operative holding area, the patient was transferred to the operating room. The patient was positioned supine on the operating room table. Anesthesia was induced. The right leg was prepped and draped in standard fashion. A surgical pause was performed confirming correct patient, procedure, and operative location.  An oblique incision was made in the right groin to expose the femoral vessels.  This was carried down through the subcutaneous tissue until the femoral sheath was  encountered.  This was entered sharply.  The femoral vessels were exposed and encircled with Silastic Vesseloops.  Longitudinal incision was made in the medial calf to expose the below-knee amputation.  Incision was carried through the posterior fascia.  The gastrocnemius was swept posteriorly.  The popliteal fat pad was identified.  The vascular bundle was skeletonized.  The popliteal artery was encircled proximally distally with Silastic Vesseloops.  An anatomic tunnel was created with a sheath tunneling device to connect the 2 exposures.  A 6 mm externally supported PTFE vascular graft was delivered through the sheath.  Sheath was removed.  The graft was secured.  Patient was heparinized with 6000 units of IV heparin.  The femoral arteries were clamped and an anterior arteriotomy was made with 11 blade.  This was extended with Potts scissors.  Extensive distal common femoral artery plaque which is near occlusive of the superficial femoral artery was encountered.  I felt it best to perform endarterectomy.  I extended the arteriotomy onto the external iliac artery.  The plaque was removed using standard endarterectomy technique.  Any loose debris was removed.  Profunda flow was confirmed.  The proximal aspect of the vascular graft was beveled to create a patch angioplasty for the bypass.  This was sewn into side to the arteriotomy using 6-0 Prolene.  Immediately prior to completion the anastomosis was flushed and de-aired.  The anastomosis was completed.  The wound was packed and attention turned to the below-knee exposure.  Flanks was sized by extending and flexing the knee so that it lay with no tension.  The graft was cut at a bevel to facilitate end-to-side anastomosis.  An anterior lateral arteriotomy was made in the below-knee popliteal  artery after clamping proximally distally.  This extended with Potts scissors.  An end-to-side anastomosis was performed with continuous suture of 6-0 Prolene.   Immediately prior to completion the clamps were released and the anastomosis flushed and de-aired.  The anastomosis was completed.  A triphasic signal was heard in the distal popliteal artery on completion. Strongly biphasic PT and peroneal signals were heard at the ankle and the signals disappeared with occlusion of the graft.  Satisfied with end of the case here.  These signals disappeared with occlusion of the graft.The signals disappeared with occlusion of the graft. Satisfied we ended the case here. The wounds were copiously irrigated with saline. The wounds were closed in layers using 2-O vicryl, 3-O vicryl, and 4-O monocryl. Steri strips, gauze, and tegederm dressings were applied.  Upon completion of the case instrument and sharps counts were confirmed correct. The patient was transferred to the PACU in good condition. I was present for all portions of the procedure.  Rande Brunt. Lenell Antu, MD [facility] Phone Number: [phone] 08/14/2020 10:27 AM

## 2020-08-14 NOTE — Progress Notes (Signed)
Foley catheter removed per MD order without difficulty.  Pt voided shortly after catheter removal.  Will continue to monitor.

## 2020-08-14 NOTE — Progress Notes (Signed)
Mobility Specialist - Progress Note   08/14/20 1526  Mobility  Activity Ambulated in hall  Level of Assistance Contact guard assist, steadying assist  Assistive Device None  Distance Ambulated (ft) 420 ft  Mobility Response Tolerated well  Mobility performed by Mobility specialist  $Mobility charge 1 Mobility    Pre-mobility: 88 HR, 172/92 BP During mobility: 128 HR Post-mobility: 117 HR, 150/101 BP  Pt c/o intermittent burning pain from his R foot ulcer that he rated a 9/10 when present. Otherwise asx during ambulation. Pt to bathroom after walk to pass a BM.   Mamie Levers Mobility Specialist Mobility Specialist Phone: [phone]

## 2020-08-14 NOTE — Progress Notes (Signed)
ASSESSMENT & PLAN:  60 y.o. male with right lower extremity atherosclerosis of native arteries causing ulceration Preoperative imaging shows SFA/Popliteal occlusion. No adequate vein for conduit. Plan right common femoral to below knee popliteal artery bypass with 38mm ePTFE in OR today  CHIEF COMPLAINT:   Right foot ulceration  HISTORY:  HISTORY OF PRESENT ILLNESS: Kirk Carrillo is a 60 y.o. male with PAD with ulceration. He underwent angiography in November which demonstrated a TASC D SFA/popliteal lesion. He was offered bypass the next week, but elected to leave the hospital and schedule electively. He returns today for bypass.  Past Medical History:  Diagnosis Date  . History of kidney stones   . Hypertension   . Iron deficiency anemia   . Malnutrition (HCC)   . Pneumonia   . Pre-diabetes   . PVD (peripheral vascular disease) (HCC)   . Tobacco abuse     Past Surgical History:  Procedure Laterality Date  . ABDOMINAL AORTOGRAM W/LOWER EXTREMITY Bilateral 06/27/2020   Procedure: ABDOMINAL AORTOGRAM W/LOWER EXTREMITY;  Surgeon: Leonie Douglas, MD;  Location: [facility];  Service: Cardiovascular;  Laterality: Bilateral;    Family History  Problem Relation Age of Onset  . Breast cancer Mother     Social History   Socioeconomic History  . Marital status: Single    Spouse name: Not on file  . Number of children: Not on file  . Years of education: Not on file  . Highest education level: Not on file  Occupational History  . Not on file  Tobacco Use  . Smoking status: Current Every Day Smoker    Packs/day: 0.25  . Smokeless tobacco: Never Used  Vaping Use  . Vaping Use: Never used  Substance and Sexual Activity  . Alcohol use: Yes    Alcohol/week: 12.0 standard drinks    Types: 12 Cans of beer per week  . Drug use: No  . Sexual activity: Not on file  Other Topics Concern  . Not on file  Social History Narrative  . Not on file   Social  Determinants of Health   Financial Resource Strain: Not on file  Food Insecurity: Not on file  Transportation Needs: Not on file  Physical Activity: Not on file  Stress: Not on file  Social Connections: Not on file  Intimate Partner Violence: Not on file    Allergies  Allergen Reactions  . Amlodipine Swelling  . Telmisartan Other (See Comments)    Facial; angioedema.     Current Facility-Administered Medications  Medication Dose Route Frequency Provider Last Rate Last Admin  . 0.9 %  sodium chloride infusion   Intravenous Continuous Leonie Douglas, MD      . ceFAZolin (ANCEF) IVPB 2g/100 mL premix  2 g Intravenous 30 min Pre-Op Leonie Douglas, MD      . Chlorhexidine Gluconate Cloth 2 % PADS 6 each  6 each Topical Once Leonie Douglas, MD       And  . Chlorhexidine Gluconate Cloth 2 % PADS 6 each  6 each Topical Once Leonie Douglas, MD        REVIEW OF SYSTEMS:  [X]  denotes positive finding, [ ]  denotes negative finding Cardiac  Comments:  Chest pain or chest pressure:    Shortness of breath upon exertion:    Short of breath when lying flat:    Irregular heart rhythm:        Vascular    Pain in calf,  thigh, or hip brought on by ambulation:    Pain in feet at night that wakes you up from your sleep:     Blood clot in your veins:    Leg swelling:         Pulmonary    Oxygen at home:    Productive cough:     Wheezing:         Neurologic    Sudden weakness in arms or legs:     Sudden numbness in arms or legs:     Sudden onset of difficulty speaking or slurred speech:    Temporary loss of vision in one eye:     Problems with dizziness:         Gastrointestinal    Blood in stool:     Vomited blood:         Genitourinary    Burning when urinating:     Blood in urine:        Psychiatric    Major depression:         Hematologic    Bleeding problems:    Problems with blood clotting too easily:        Skin    Rashes or ulcers:        Constitutional     Fever or chills:     PHYSICAL EXAM:   Vitals:   08/14/20 0609  BP: (!) 158/84  Pulse: 88  Resp: 20  Temp: 98 F (36.7 C)  TempSrc: Oral  SpO2: 100%  Weight: 62.8 kg  Height: 5\' 8"  (1.727 m)   Constitutional: well appearing in no distress. Appears well nourished.  Neurologic: CN intact. No focal findings. No sensory loss. Psychiatric: Mood and affect symmetric and appropriate. Eyes: No icterus. No conjunctival pallor. Ears, nose, throat: mucous membranes moist. Midline trachea.  Cardiac: Normal rate and rhythm.  Respiratory: unlabored. Abdominal: soft, non-tender, non-distended.  Peripheral vascular:  No palpable R pedal pulses Extremity: No edema. No cyanosis. No pallor.  Skin: No gangrene. + ulceration.  Lymphatic: No Stemmer's sign. No palpable lymphadenopathy.   DATA REVIEW:    Most recent CBC CBC Latest Ref Rng & Units 08/12/2020 06/28/2020 06/27/2020  WBC 4.0 - 10.5 K/uL 6.4 5.4 7.2  Hemoglobin 13.0 - 17.0 g/dL 06/29/2020 93.8) 1.0(F)  Hematocrit 39.0 - 52.0 % 46.4 27.1(L) 27.1(L)  Platelets 150 - 400 K/uL 355 646(H) 651(H)     Most recent CMP CMP Latest Ref Rng & Units 08/12/2020 06/28/2020 06/27/2020  Glucose 70 - 99 mg/dL 91 95 94  BUN 8 - 23 mg/dL 7(L) 7(L) 6(L)  Creatinine 0.61 - 1.24 mg/dL 06/29/2020) 0.25(E) 5.27(P)  Sodium 135 - 145 mmol/L 133(L) 134(L) 134(L)  Potassium 3.5 - 5.1 mmol/L 3.6 3.8 4.0  Chloride 98 - 111 mmol/L 97(L) 100 98  CO2 22 - 32 mmol/L 25 26 26   Calcium 8.9 - 10.3 mg/dL 9.4 8.24(M) )  Total Protein 6.5 - 8.1 g/dL 7.1 - -  Total Bilirubin 0.3 - 1.2 mg/dL 0.6 - -  Alkaline Phos 38 - 126 U/L 90 - -  AST 15 - 41 U/L 28 - -  ALT 0 - 44 U/L 14 - -    Renal function Estimated Creatinine Clearance: 86.1 mL/min (A) (by C-G formula based on SCr of 0.54 mg/dL (L)).  Hgb A1c MFr Bld (%)  Date Value  06/27/2020 6.2 (H)    LDL Cholesterol  Date Value Ref Range Status  06/28/2020 50 0 - 99 mg/dL Final  Comment:           Total  Cholesterol/HDL:CHD Risk Coronary Heart Disease Risk Table                     Men   Women  1/2 Average Risk   3.4   3.3  Average Risk       5.0   4.4  2 X Average Risk   9.6   7.1  3 X Average Risk  23.4   11.0        Use the calculated Patient Ratio above and the CHD Risk Table to determine the patient's CHD Risk.        ATP III CLASSIFICATION (LDL):  <100     mg/dL   Optimal  619-509  mg/dL   Near or Above                    Optimal  130-159  mg/dL   Borderline  326-712  mg/dL   High  >458     mg/dL   Very High Performed at [facility], [address] [place], [place] [address]     Rande Brunt. Lenell Antu, MD [facility] Phone Number: [phone] 08/14/2020 6:47 AM

## 2020-08-14 NOTE — H&P (Signed)
ASSESSMENT & PLAN:  60 y.o. male with right lower extremity atherosclerosis of native arteries causing ulceration Preoperative imaging shows SFA/Popliteal occlusion. No adequate vein for conduit. Plan right common femoral to below knee popliteal artery bypass with 33mm ePTFE in OR today  CHIEF COMPLAINT:   Right foot ulceration  HISTORY:  HISTORY OF PRESENT ILLNESS: Kirk Carrillo is a 60 y.o. male with PAD with ulceration. He underwent angiography in November which demonstrated a TASC D SFA/popliteal lesion. He was offered bypass the next week, but elected to leave the hospital and schedule electively. He returns today for bypass.  Past Medical History:  Diagnosis Date  . History of kidney stones   . Hypertension   . Iron deficiency anemia   . Malnutrition (HCC)   . Pneumonia   . Pre-diabetes   . PVD (peripheral vascular disease) (HCC)   . Tobacco abuse     Past Surgical History:  Procedure Laterality Date  . ABDOMINAL AORTOGRAM W/LOWER EXTREMITY Bilateral 06/27/2020   Procedure: ABDOMINAL AORTOGRAM W/LOWER EXTREMITY;  Surgeon: Leonie Douglas, MD;  Location: [facility];  Service: Cardiovascular;  Laterality: Bilateral;    Family History  Problem Relation Age of Onset  . Breast cancer Mother     Social History   Socioeconomic History  . Marital status: Single    Spouse name: Not on file  . Number of children: Not on file  . Years of education: Not on file  . Highest education level: Not on file  Occupational History  . Not on file  Tobacco Use  . Smoking status: Current Every Day Smoker    Packs/day: 0.25  . Smokeless tobacco: Never Used  Vaping Use  . Vaping Use: Never used  Substance and Sexual Activity  . Alcohol use: Yes    Alcohol/week: 12.0 standard drinks    Types: 12 Cans of beer per week  . Drug use: No  . Sexual activity: Not on file  Other Topics Concern  . Not on file  Social History Narrative  . Not on file   Social  Determinants of Health   Financial Resource Strain: Not on file  Food Insecurity: Not on file  Transportation Needs: Not on file  Physical Activity: Not on file  Stress: Not on file  Social Connections: Not on file  Intimate Partner Violence: Not on file    Allergies  Allergen Reactions  . Amlodipine Swelling  . Telmisartan Other (See Comments)    Facial; angioedema.     Current Facility-Administered Medications  Medication Dose Route Frequency Provider Last Rate Last Admin  . 0.9 %  sodium chloride infusion   Intravenous Continuous Leonie Douglas, MD      . Chlorhexidine Gluconate Cloth 2 % PADS 6 each  6 each Topical Once Leonie Douglas, MD       And  . Chlorhexidine Gluconate Cloth 2 % PADS 6 each  6 each Topical Once Leonie Douglas, MD      . HYDROmorphone (DILAUDID) injection 0.25-0.5 mg  0.25-0.5 mg Intravenous Q5 min PRN Jairo Ben, MD      . meperidine (DEMEROL) injection 6.25-12.5 mg  6.25-12.5 mg Intravenous Q5 min PRN Jairo Ben, MD      . midazolam (VERSED) injection 0.5-2 mg  0.5-2 mg Intravenous Once PRN Jairo Ben, MD      . oxyCODONE (Oxy IR/ROXICODONE) immediate release tablet 5 mg  5 mg Oral Once PRN Jairo Ben, MD  Or  . oxyCODONE (ROXICODONE) 5 MG/5ML solution 5 mg  5 mg Oral Once PRN Jairo Ben, MD      . promethazine (PHENERGAN) injection 6.25-12.5 mg  6.25-12.5 mg Intravenous Q15 min PRN Jairo Ben, MD        REVIEW OF SYSTEMS:  [X]  denotes positive finding, [ ]  denotes negative finding Cardiac  Comments:  Chest pain or chest pressure:    Shortness of breath upon exertion:    Short of breath when lying flat:    Irregular heart rhythm:        Vascular    Pain in calf, thigh, or hip brought on by ambulation:    Pain in feet at night that wakes you up from your sleep:     Blood clot in your veins:    Leg swelling:         Pulmonary    Oxygen at home:    Productive cough:     Wheezing:          Neurologic    Sudden weakness in arms or legs:     Sudden numbness in arms or legs:     Sudden onset of difficulty speaking or slurred speech:    Temporary loss of vision in one eye:     Problems with dizziness:         Gastrointestinal    Blood in stool:     Vomited blood:         Genitourinary    Burning when urinating:     Blood in urine:        Psychiatric    Major depression:         Hematologic    Bleeding problems:    Problems with blood clotting too easily:        Skin    Rashes or ulcers:        Constitutional    Fever or chills:     PHYSICAL EXAM:   Vitals:   08/14/20 0609 08/14/20 1040  BP: (!) 158/84 106/77  Pulse: 88 96  Resp: 20 19  Temp: 98 F (36.7 C)   TempSrc: Oral   SpO2: 100% 100%  Weight: 62.8 kg   Height: 5\' 8"  (1.727 m)    Constitutional: well appearing in no distress. Appears well nourished.  Neurologic: CN intact. No focal findings. No sensory loss. Psychiatric: Mood and affect symmetric and appropriate. Eyes: No icterus. No conjunctival pallor. Ears, nose, throat: mucous membranes moist. Midline trachea.  Cardiac: Normal rate and rhythm.  Respiratory: unlabored. Abdominal: soft, non-tender, non-distended.  Peripheral vascular:  No palpable R pedal pulses Extremity: No edema. No cyanosis. No pallor.  Skin: No gangrene. + ulceration.  Lymphatic: No Stemmer's sign. No palpable lymphadenopathy.   DATA REVIEW:    Most recent CBC CBC Latest Ref Rng & Units 08/12/2020 06/28/2020 06/27/2020  WBC 4.0 - 10.5 K/uL 6.4 5.4 7.2  Hemoglobin 13.0 - 17.0 g/dL 10/10/2020 06/30/2020) 06/29/2020)  Hematocrit 39.0 - 52.0 % 46.4 27.1(L) 27.1(L)  Platelets 150 - 400 K/uL 355 646(H) 651(H)     Most recent CMP CMP Latest Ref Rng & Units 08/12/2020 06/28/2020 06/27/2020  Glucose 70 - 99 mg/dL 91 95 94  BUN 8 - 23 mg/dL 7(L) 7(L) 6(L)  Creatinine 0.61 - 1.24 mg/dL 10/10/2020) 06/30/2020) 06/29/2020)  Sodium 135 - 145 mmol/L 133(L) 134(L) 134(L)  Potassium 3.5 - 5.1 mmol/L  3.6 3.8 4.0  Chloride 98 - 111 mmol/L 97(L) 100 98  CO2  22 - 32 mmol/L 25 26 26   Calcium 8.9 - 10.3 mg/dL 9.4 ) 7.1(I)  Total Protein 6.5 - 8.1 g/dL 7.1 - -  Total Bilirubin 0.3 - 1.2 mg/dL 0.6 - -  Alkaline Phos 38 - 126 U/L 90 - -  AST 15 - 41 U/L 28 - -  ALT 0 - 44 U/L 14 - -    Renal function Estimated Creatinine Clearance: 86.1 mL/min (A) (by C-G formula based on SCr of 0.54 mg/dL (L)).  Hgb A1c MFr Bld (%)  Date Value  06/27/2020 6.2 (H)    LDL Cholesterol  Date Value Ref Range Status  06/28/2020 50 0 - 99 mg/dL Final    Comment:           Total Cholesterol/HDL:CHD Risk Coronary Heart Disease Risk Table                     Men   Women  1/2 Average Risk   3.4   3.3  Average Risk       5.0   4.4  2 X Average Risk   9.6   7.1  3 X Average Risk  23.4   11.0        Use the calculated Patient Ratio above and the CHD Risk Table to determine the patient's CHD Risk.        ATP III CLASSIFICATION (LDL):  <100     mg/dL   Optimal  06/30/2020  mg/dL   Near or Above                    Optimal  130-159  mg/dL   Borderline  099-833  mg/dL   High  825-053     mg/dL   Very High Performed at [facility], [address], [place], [place]     [id number]. Rande Brunt, MD [facility] Phone Number: [phone] 08/14/2020 10:47 AM

## 2020-08-14 NOTE — Progress Notes (Signed)
PHARMACIST LIPID MONITORING   Kirk Carrillo is a 60 y.o. male admitted on 08/14/2020 with PVD.  Pharmacy has been consulted to optimize lipid-lowering therapy with the indication of secondary prevention for clinical ASCVD.  Recent Labs:  Lipid Panel (last 6 months):   Lab Results  Component Value Date   CHOL 91 06/28/2020   TRIG 114 06/28/2020   HDL 18 (L) 06/28/2020   CHOLHDL 5.1 06/28/2020   VLDL 23 06/28/2020   LDLCALC 50 06/28/2020    Hepatic function panel (last 6 months):   Lab Results  Component Value Date   AST 28 08/12/2020   ALT 14 08/12/2020   ALKPHOS 90 08/12/2020   BILITOT 0.6 08/12/2020   BILIDIR 0.1 06/20/2020   IBILI 1.3 (H) 06/20/2020    SCr (since admission):   Serum creatinine: 0.54 mg/dL (L) 21/30/86 5784 Estimated creatinine clearance: 86.1 mL/min (A)  Current lipid-lowering therapy: crestor 5mg  po daily (not on a statin at home) Previous lipid-lowering therapies (if applicable): none Documented or reported allergies or intolerances to lipid-lowering therapies (if applicable): none  Assessment:  Patient with PVD and not on a statin PTA.   Recommendation per protocol:  Increase intensity or dose of current statin.  Follow-up with: Primary care provider  Follow-up labs after discharge:    Lipid panel annually  Plan: -Increase crestor to 20mg  po daily  , PharmD Clinical Pharmacist **Pharmacist phone directory can now be found on [email or web address] ([email or web address]).  Listed under [facility].

## 2020-08-14 NOTE — Anesthesia Procedure Notes (Signed)
Procedure Name: Intubation Date/Time: 08/14/2020 7:42 AM Performed by: Hendricks Limes, CRNA Pre-anesthesia Checklist: Patient identified, Emergency Drugs available, Suction available and Patient being monitored Patient Re-evaluated:Patient Re-evaluated prior to induction Oxygen Delivery Method: Circle system utilized Preoxygenation: Pre-oxygenation with 100% oxygen Induction Type: IV induction Ventilation: Mask ventilation without difficulty Laryngoscope Size: Mac and 3 Grade View: Grade I Tube type: Oral Number of attempts: 1 Airway Equipment and Method: Stylet Placement Confirmation: ETT inserted through vocal cords under direct vision,  positive ETCO2 and breath sounds checked- equal and bilateral Secured at: 22 cm Tube secured with: Tape Dental Injury: Teeth and Oropharynx as per pre-operative assessment

## 2020-08-14 NOTE — Anesthesia Postprocedure Evaluation (Signed)
Anesthesia Post Note  Patient: Product manager  Procedure(s) Performed: RIGHT COMMON FEMORAL TO RIGHT BELOW KNEE POPLITEAL BYPASS GRAFT (Right Leg Upper) RIGHT ILIOFEMORAL ENDARTERECTOMY (Right Groin)     Patient location during evaluation: Nursing Unit Anesthesia Type: General Level of consciousness: awake and alert, patient cooperative and oriented Pain management: pain level controlled Vital Signs Assessment: post-procedure vital signs reviewed and stable Respiratory status: spontaneous breathing, nonlabored ventilation and respiratory function stable Cardiovascular status: blood pressure returned to baseline and stable Postop Assessment: no apparent nausea or vomiting, able to ambulate and adequate PO intake Anesthetic complications: no   No complications documented.  Last Vitals:  Vitals:   08/14/20 1131 08/14/20 1153  BP: (!) 130/94 (!) 146/88  Pulse:    Resp: 16   Temp: 36.4 C 36.5 C  SpO2: 96%     Last Pain:  Vitals:   08/14/20 1308  TempSrc:   PainSc: 2                  Rickeya Manus,E. Shawnna Pancake

## 2020-08-15 ENCOUNTER — Encounter (HOSPITAL_COMMUNITY): Payer: Self-pay | Admitting: Vascular Surgery

## 2020-08-15 LAB — BASIC METABOLIC PANEL
Anion gap: 11 (ref 5–15)
BUN: 9 mg/dL (ref 8–23)
CO2: 24 mmol/L (ref 22–32)
Calcium: 8.7 mg/dL — ABNORMAL LOW (ref 8.9–10.3)
Chloride: 101 mmol/L (ref 98–111)
Creatinine, Ser: 0.55 mg/dL — ABNORMAL LOW (ref 0.61–1.24)
GFR, Estimated: >60 mL/min (ref >60)
Glucose, Bld: 138 mg/dL — ABNORMAL HIGH (ref 70–99)
Potassium: 3.2 mmol/L — ABNORMAL LOW (ref 3.5–5.1)
Sodium: 136 mmol/L (ref 135–145)

## 2020-08-15 LAB — CBC
HCT: 39.2 % (ref 39.0–52.0)
Hemoglobin: 12 g/dL — ABNORMAL LOW (ref 13.0–17.0)
MCH: 27.3 pg (ref 26.0–34.0)
MCHC: 30.6 g/dL (ref 30.0–36.0)
MCV: 89.3 fL (ref 80.0–100.0)
Platelets: 330 10*3/uL (ref 150–400)
RBC: 4.39 MIL/uL (ref 4.22–5.81)
RDW: 20.2 % — ABNORMAL HIGH (ref 11.5–15.5)
WBC: 9.9 10*3/uL (ref 4.0–10.5)
nRBC: 0 % (ref 0.0–0.2)

## 2020-08-15 MED ORDER — ROSUVASTATIN CALCIUM 20 MG PO TABS: 20.0000 mg | ORAL_TABLET | Freq: Every day | ORAL | 3 refills | Status: AC

## 2020-08-15 MED ORDER — OXYCODONE-ACETAMINOPHEN 5-325 MG PO TABS: 1.0000 | ORAL_TABLET | Freq: Four times a day (QID) | ORAL | 0 refills | Status: AC | PRN

## 2020-08-15 NOTE — Discharge Summary (Signed)
Discharge Summary     Kirk Carrillo July 16, 1961 60 y.o. male  [id number]  Admission Date: 08/14/2020  Discharge Date: 08/15/2020  Physician: Leonie Douglas, MD  Admission Diagnosis: PAD (peripheral artery disease) (HCC) [I73.9]  HPI:   This is a 60 y.o. male with PAD with ulceration. He underwent angiography in November which demonstrated a TASC D SFA/popliteal lesion. He was offered bypass the next week, but elected to leave the hospital and schedule electively. He returns today for bypass.  Hospital Course:  The patient was admitted to the hospital and taken to the operating room on 08/14/2020 and underwent: 1) right iliofemoral endarterectomy 2) right femoral to below-knee popliteal bypass with 6 mm ePTFE    Findings: more extensive iliofemoral plaque than predicted by angiogram. Required endarterectomy. At completion, strongly biphasic PT/Peroneal which nearly disappeared with occlusion of bypass.  The pt tolerated the procedure well and was transported to the PACU in good condition.   The afternoon of surgery, pt had ambulated in hallways and doing well.  His foley was removed and he was able to void without difficulty.  By POD 1, he is doing well with patent bypass with brisk doppler signals right DP/PT/peroneal.  PT worked with pt and did not have any recommendations.  Pt is discharged home.    CBC    Component Value Date/Time   WBC 9.9 08/15/2020 0321   RBC 4.39 08/15/2020 0321   HGB 12.0 (L) 08/15/2020 0321   HCT 39.2 08/15/2020 0321   PLT 330 08/15/2020 0321   MCV 89.3 08/15/2020 0321   MCH 27.3 08/15/2020 0321   MCHC 30.6 08/15/2020 0321   RDW 20.2 (H) 08/15/2020 0321   LYMPHSABS 2.1 06/22/2020 0444   MONOABS 0.8 06/22/2020 0444   EOSABS 0.1 06/22/2020 0444   BASOSABS 0.0 06/22/2020 0444    BMET    Component Value Date/Time   NA 136 08/15/2020 0321   K 3.2 (L) 08/15/2020 0321   CL 101 08/15/2020 0321   CO2 24 08/15/2020 0321   GLUCOSE 138 (H)  08/15/2020 0321   BUN 9 08/15/2020 0321   CREATININE 0.55 (L) 08/15/2020 0321   CALCIUM 8.7 (L) 08/15/2020 0321   GFRNONAA >60 08/15/2020 0321     Discharge Instructions    Discharge patient   Complete by: As directed    Discharge pt after PT has worked with him.  I don't anticipate he will need anything at home, but if they do have recommendations, please let [name] know prior to discharge.  Thanks.   Discharge disposition: 01-Home or Self Care   Discharge patient date: 08/15/2020      Discharge Diagnosis:  PAD (peripheral artery disease) (HCC) [I73.9]  Secondary Diagnosis: Patient Active Problem List   Diagnosis Date Noted  . PAD (peripheral artery disease) (HCC) 08/14/2020  . PVD (peripheral vascular disease) (HCC) 06/28/2020  . Pneumonia of right lung due to infectious organism 06/21/2020  . Essential hypertension 06/21/2020  . Empyema of right pleural space (HCC) 06/21/2020  . Alcohol abuse 06/21/2020  . Ulcer of toe, right, with fat layer exposed (HCC) 06/21/2020  . Nicotine dependence, cigarettes, uncomplicated 06/21/2020  . Sepsis due to pneumonia (HCC) 06/20/2020   Past Medical History:  Diagnosis Date  . History of kidney stones   . Hypertension   . Iron deficiency anemia   . Malnutrition (HCC)   . Pneumonia   . Pre-diabetes   . PVD (peripheral vascular disease) (HCC)   . Tobacco abuse  Allergies as of 08/15/2020      Reactions   Amlodipine Swelling   Telmisartan Other (See Comments)   Facial; angioedema.      Medication List    TAKE these medications   albuterol 108 (90 Base) MCG/ACT inhaler Commonly known as: ProAir HFA Inhale 1-2 puffs into the lungs every 6 (six) hours as needed for wheezing or shortness of breath.   Aquacel Ag Advantage 4"X5" Pads Apply 1 application topically daily.   aspirin EC 81 MG tablet Take 1 tablet (81 mg total) by mouth daily. Swallow whole.   BENGAY EX Apply 1 application topically daily.   CLEAR EYES  OP Place 1 drop into both eyes daily as needed (irritation).   cyanocobalamin 1000 MCG tablet Take 1 tablet (1,000 mcg total) by mouth daily.   diphenhydrAMINE 25 mg capsule Commonly known as: BENADRYL Take 25 mg by mouth 2 (two) times daily.   Ester-C 500-550 MG Tabs Take 500 mg by mouth daily.   ferrous sulfate 325 (65 FE) MG tablet Take 1 tablet (325 mg total) by mouth 3 (three) times daily with meals. What changed: when to take this   hydrochlorothiazide 25 MG tablet Commonly known as: HYDRODIURIL Take 1 tablet (25 mg total) by mouth daily.   ibuprofen 200 MG tablet Commonly known as: ADVIL Take 400 mg by mouth every 6 (six) hours as needed for moderate pain.   ICY HOT EX Apply 1 application topically daily.   oxyCODONE-acetaminophen 5-325 MG tablet Commonly known as: Percocet Take 1 tablet by mouth every 6 (six) hours as needed for severe pain.   rosuvastatin 20 MG tablet Commonly known as: CRESTOR Take 1 tablet (20 mg total) by mouth daily.       Discharge Instructions: [facility] Discharge instructions Lower Extremity Bypass Surgery  Please refer to the following instruction for your post-procedure care. Your surgeon or physician assistant will discuss any changes with you.  Activity  You are encouraged to walk as much as you can. You can slowly return to normal activities during the month after your surgery. Avoid strenuous activity and heavy lifting until your doctor tells you it's OK. Avoid activities such as vacuuming or swinging a golf club. Do not drive until your doctor give the OK and you are no longer taking prescription pain medications. It is also normal to have difficulty with sleep habits, eating and bowel movement after surgery. These will go away with time.  Bathing/Showering  You may shower after you go home. Do not soak in a bathtub, hot tub, or swim until the incision heals completely.  Incision  Care  Clean your incision with mild soap and water. Shower every day. Pat the area dry with a clean towel. You do not need a bandage unless otherwise instructed. Do not apply any ointments or creams to your incision. If you have open wounds you will be instructed how to care for them or a visiting nurse may be arranged for you. If you have staples or sutures along your incision they will be removed at your post-op appointment. You may have skin glue on your incision. Do not peel it off. It will come off on its own in about one week.  Wash the groin wound with soap and water daily and pat dry. (No tub bath-only shower)  Then put a dry gauze or washcloth in the groin to keep this area dry to help prevent wound infection.  Do this daily and  as needed.  Do not use Vaseline or neosporin on your incisions.  Only use soap and water on your incisions and then protect and keep dry.  Diet  Resume your normal diet. There are no special food restrictions following this procedure. A low fat/ low cholesterol diet is recommended for all patients with vascular disease. In order to heal from your surgery, it is CRITICAL to get adequate nutrition. Your body requires vitamins, minerals, and protein. Vegetables are the best source of vitamins and minerals. Vegetables also provide the perfect balance of protein. Processed food has little nutritional value, so try to avoid this.  Medications  Resume taking all your medications unless your doctor or Physician Assistant tells you not to. If your incision is causing pain, you may take over-the-counter pain relievers such as acetaminophen (Tylenol). If you were prescribed a stronger pain medication, please aware these medication can cause nausea and constipation. Prevent nausea by taking the medication with a snack or meal. Avoid constipation by drinking plenty of fluids and eating foods with high amount of fiber, such as fruits, vegetables, and grains. Take Colace 100 mg (an  over-the-counter stool softener) twice a day as needed for constipation.  Do not take Tylenol if you are taking prescription pain medications.  Follow Up  Our office will schedule a follow up appointment 2-3 weeks following discharge.  Please call us immediately for any of the following conditions  .Severe or worsening pain in your legs or feet while at rest or while walking .Increase pain, redness, warmth, or drainage (pus) from your incision site(s) . Fever of 101 degree or higher . The swelling in your leg with the bypass suddenly worsens and becomes more painful than when you were in the hospital . If you have been instructed to feel your graft pulse then you should do so every day. If you can no longer feel this pulse, call the office immediately. Not all patients are given this instruction. .  Leg swelling is common after leg bypass surgery.  The swelling should improve over a few months following surgery. To improve the swelling, you may elevate your legs above the level of your heart while you are sitting or resting. Your surgeon or physician assistant may ask you to apply an ACE wrap or wear compression (TED) stockings to help to reduce swelling.  Reduce your risk of vascular disease  Stop smoking. If you would like help call [facility] at 1-800-QUIT-NOW ((416)053-5056) or [place] at [phone].  . Manage your cholesterol . Maintain a desired weight . Control your diabetes weight . Control your diabetes . Keep your blood pressure down .  If you have any questions, please call the office at [phone]   Prescriptions given: 1.  Roxicet #20 No Refill 2.  Crestor 20mg  qhs #90 three refills  Disposition: home  Patient's condition: is Good  Follow up: 1. Dr. in 3 weeks   Lenell Antu, PA-C [facility] [phone] 08/15/2020  7:35 AM  - For VQI Registry use ---   Post-op:  Wound infection: No  Graft infection:  No  Transfusion: No    If yes, n/a units given New Arrhythmia: No Ipsilateral amputation: No, [ ]  Minor, [ ]  BKA, [ ]  AKA Discharge patency: [x ] Primary, [ ]  Primary assisted, [ ]  Secondary, [ ]  Occluded Patency judged by: [x ] Dopper only, [ ]  Palpable graft pulse, []  Palpable distal pulse, [ ]  ABI inc. > 0.15, [ ]  Duplex Discharge  ABI: R not done, L  D/C Ambulatory Status: Ambulatory  Complications: MI: No, [ ]  Troponin only, $RemoveBefore EID_[id number]$[ ]TAG>[ ]  Pneumonia, [ ]  Ventilator Chg in renal function: No, [ ]  Inc. Cr > 0.5, [ ]  Temp. Dialysis,  [ ]  Permanent dialysis Stroke: No, [ ]  Minor, [ ]  Major Return to OR: No  Reason for return to OR: [ ]  Bleeding, [ ]  Infection, [ ]  Thrombosis, [ ]  Revision  Discharge medications: Statin use:  yes ASA use:  yes Plavix use:  no Beta blocker use: no CCB use:  No ACEI use:   no ARB use:  no Coumadin use: no

## 2020-08-15 NOTE — Consult Note (Signed)
WOC Nurse wound consult note Consultation was completed by review of records, images and assistance from the bedside nurse/clinical staff.   Reason for Consult: toe ulceration Admitted for re-vascularization procedure Wound type:areterial Pressure Injury POA: NA Measurement: see nursing notes Wound BFX:OVANV per [facility] Drainage (amount, consistency, odor) none Periwound:intact   Dressing procedure/placement/frequency: Dry dressing ordered per [facility]    Re consult if needed, will not follow at this time. Thanks  Deletha Jaffee [employer], RN,CWOCN, CNS, CWON-AP [phone])

## 2020-08-15 NOTE — Progress Notes (Addendum)
  Progress Note    08/15/2020 7:03 AM 1 Day Post-Op  Subjective:  Says he's sore especially around the groin.  Says swelling in leg is better.  Afebrile HR 70's-100's  130's-170's systolic 98% RA  Vitals:   08/15/20 0430 08/15/20 0456  BP:  (!) 135/100  Pulse:    Resp:  18  Temp: 98.3 F (36.8 C) 98.3 F (36.8 C)  SpO2:  98%    Physical Exam: Cardiac:  regular Lungs:  Non labored Incisions:  Right groin and right BK incisions are clean with steri strips in place Extremities:  Brisk right DP/PT/peroneal doppler signals; motor and sensory are in tact; calf and anterior compartments are soft and non tender   CBC    Component Value Date/Time   WBC 9.9 08/15/2020 0321   RBC 4.39 08/15/2020 0321   HGB 12.0 (L) 08/15/2020 0321   HCT 39.2 08/15/2020 0321   PLT 330 08/15/2020 0321   MCV 89.3 08/15/2020 0321   MCH 27.3 08/15/2020 0321   MCHC 30.6 08/15/2020 0321   RDW 20.2 (H) 08/15/2020 0321   LYMPHSABS 2.1 06/22/2020 0444   MONOABS 0.8 06/22/2020 0444   EOSABS 0.1 06/22/2020 0444   BASOSABS 0.0 06/22/2020 0444    BMET    Component Value Date/Time   NA 136 08/15/2020 0321   K 3.2 (L) 08/15/2020 0321   CL 101 08/15/2020 0321   CO2 24 08/15/2020 0321   GLUCOSE 138 (H) 08/15/2020 0321   BUN 9 08/15/2020 0321   CREATININE 0.55 (L) 08/15/2020 0321   CALCIUM 8.7 (L) 08/15/2020 0321   GFRNONAA >60 08/15/2020 0321    INR    Component Value Date/Time   INR 0.9 08/12/2020 1033     Intake/Output Summary (Last 24 hours) at 08/15/2020 0703 Last data filed at 08/15/2020 0500 Gross per 24 hour  Intake 1980 ml  Output 2670 ml  Net -690 ml     Assessment:  60 y.o. male is s/p:  1)right iliofemoral endarterectomy 2)right femoral to below-knee popliteal bypass with 6 mmePTFE  1 Day Post-Op  Plan: -pt with patent bypass with brisk DP/PT/peroneal doppler signals -pt ambulated in halls yesterday, foley removed yesterday and voided without difficulty. -pt does  have stairs in his house, will have PT work with pt this am and anticipate discharge around lunch time.  -f/u with Dr. Lenell Antu in 3 weeks -long discussion with pt about wound care -PDMP reviewed   Doreatha Massed, PA-C [facility] [phone] 08/15/2020 7:03 AM

## 2020-08-15 NOTE — Evaluation (Signed)
Physical Therapy Evaluation Patient Details Name: Kirk Carrillo MRN: [id number] DOB: 1960-12-08 Today's Date: 08/15/2020   History of Present Illness  60 y.o. male with PAD with ulceration of R forth toe. S/p 08/14/20 right iliofemoral endarterectomy and right femoral to below-knee popliteal bypass  Clinical Impression  Patient evaluated by Physical Therapy with no further acute PT needs identified. All education has been completed and the patient has no further questions. Pt has no additional follow-up Physical Therapy or equipment needs. PT is signing off. Thank you for this referral.     Follow Up Recommendations No PT follow up;Supervision for mobility/OOB    Equipment Recommendations  None recommended by PT       Precautions / Restrictions Precautions Precautions: None Restrictions Weight Bearing Restrictions: No      Mobility  Bed Mobility Overal bed mobility: Modified Independent             General bed mobility comments: HoB elevated    Transfers Overall transfer level: Modified independent Equipment used: None             General transfer comment: good power up and self steadying, increased time due to pain and stiffness in R LE  Ambulation/Gait Ambulation/Gait assistance: Supervision Gait Distance (Feet): 400 Feet Assistive device: None Gait Pattern/deviations: Decreased weight shift to right;Decreased stance time - right;Step-through pattern;Antalgic Gait velocity: WFL Gait velocity interpretation: >2.62 ft/sec, indicative of community ambulatory General Gait Details: continues to be limited in gait pattern by R toe pain with weightbearing, pt tries to keep weight off his toes, overall gait is steady  Stairs Stairs: Yes Stairs assistance: Modified independent (Device/Increase time) Stair Management: Two rails;Forwards;Alternating pattern Number of Stairs: 18 General stair comments: increased time and effort with decreased weightbearing through  R forefoot  Wheelchair Mobility    Modified Rankin (Stroke Patients Only)       Balance Overall balance assessment: Modified Independent                                           Pertinent Vitals/Pain Pain Assessment: 0-10 Pain Score: 7  Pain Location: R fourth toe ulcer, 4/10 at incision Pain Descriptors / Indicators: Aching;Throbbing;Sharp Pain Intervention(s): Limited activity within patient's tolerance;Monitored during session;Repositioned    Home Living Family/patient expects to be discharged to:: Private residence Living Arrangements: Spouse/significant other Available Help at Discharge: Family;Available PRN/intermittently Type of Home: Apartment Home Access: Stairs to enter Entrance Stairs-Rails: Can reach both Entrance Stairs-Number of Steps: 16 Home Layout: One level Home Equipment: None      Prior Function Level of Independence: Independent         Comments: Print production planner        Extremity/Trunk Assessment   Upper Extremity Assessment Upper Extremity Assessment: Overall WFL for tasks assessed    Lower Extremity Assessment Lower Extremity Assessment: RLE deficits/detail RLE Deficits / Details: R knee and hip ROM limited by surgical pain, Strength assessed during mobility to be 4/5 RLE: Unable to fully assess due to pain RLE Sensation: decreased light touch       Communication   Communication: No difficulties  Cognition Arousal/Alertness: Awake/alert Behavior During Therapy: WFL for tasks assessed/performed Overall Cognitive Status: Within Functional Limits for tasks assessed  General Comments General comments (skin integrity, edema, etc.): VSS on RA        Assessment/Plan    PT Assessment Patent does not need any further PT services         PT Goals (Current goals can be found in the Care Plan section)  Acute Rehab PT Goals Patient Stated  Goal: get back to work PT Goal Formulation: With patient     AM-PAC PT "6 Clicks" Mobility  Outcome Measure Help needed turning from your back to your side while in a flat bed without using bedrails?: None Help needed moving from lying on your back to sitting on the side of a flat bed without using bedrails?: None Help needed moving to and from a bed to a chair (including a wheelchair)?: None Help needed standing up from a chair using your arms (e.g., wheelchair or bedside chair)?: None Help needed to walk in hospital room?: None Help needed climbing 3-5 steps with a railing? : None 6 Click Score: 24    End of Session Equipment Utilized During Treatment: Gait belt Activity Tolerance: Patient tolerated treatment well Patient left: in bed;with call bell/phone within reach Nurse Communication: Mobility status PT Visit Diagnosis: Other abnormalities of gait and mobility (R26.89)    Time: 6237-6283 PT Time Calculation (min) (ACUTE ONLY): 23 min   Charges:   PT Evaluation $PT Eval Low Complexity: 1 Low PT Treatments $Gait Training: 8-22 mins        Elanna Bert B. Beverely Risen PT, DPT Acute Rehabilitation Services Pager [phone] Office [phone]   Elon Alas Fleet 08/15/2020, 11:22 AM

## 2020-08-15 NOTE — Discharge Instructions (Signed)
[facility]  Discharge instructions  Lower Extremity Bypass Surgery  Please refer to the following instruction for your post-procedure care. Your surgeon or physician assistant will discuss any changes with you.  Activity  You are encouraged to walk as much as you can. You can slowly return to normal activities during the month after your surgery. Avoid strenuous activity and heavy lifting until your doctor tells you it's OK. Avoid activities such as vacuuming or swinging a golf club. Do not drive until your doctor give the OK and you are no longer taking prescription pain medications. It is also normal to have difficulty with sleep habits, eating and bowel movement after surgery. These will go away with time.  Bathing/Showering  Shower daily after you go home. Do not soak in a bathtub, hot tub, or swim until the incision heals completely.  Incision Care  Clean your incision with mild soap and water. Shower every day. Pat the area dry with a clean towel. You do not need a bandage unless otherwise instructed. Do not apply any ointments or creams to your incision. If you have open wounds you will be instructed how to care for them or a visiting nurse may be arranged for you. If you have staples or sutures along your incision they will be removed at your post-op appointment. You may have skin glue on your incision. Do not peel it off. It will come off on its own in about one week.  Wash the groin wound with soap and water daily and pat dry. (No tub bath-only shower)  Then put a dry gauze or washcloth in the groin to keep this area dry to help prevent wound infection.  Do this daily and as needed.  Do not use Vaseline or neosporin on your incisions.  Only use soap and water on your incisions and then protect and keep dry.  Paint toe ulcer with betadine daily and keep dry.  Diet  Resume your normal diet. There are no special food restrictions following this  procedure. A low fat/ low cholesterol diet is recommended for all patients with vascular disease. In order to heal from your surgery, it is CRITICAL to get adequate nutrition. Your body requires vitamins, minerals, and protein. Vegetables are the best source of vitamins and minerals. Vegetables also provide the perfect balance of protein. Processed food has little nutritional value, so try to avoid this.  Medications  Resume taking all your medications unless your doctor or physician assistant tells you not to. If your incision is causing pain, you may take over-the-counter pain relievers such as acetaminophen (Tylenol). If you were prescribed a stronger pain medication, please aware these medication can cause nausea and constipation. Prevent nausea by taking the medication with a snack or meal. Avoid constipation by drinking plenty of fluids and eating foods with high amount of fiber, such as fruits, vegetables, and grains. Take Colace 100 mg (an over-the-counter stool softener) twice a day as needed for constipation.  Do not take Tylenol if you are taking prescription pain medications.  Follow Up  Our office will schedule a follow up appointment 2-3 weeks following discharge.  Please call us immediately for any of the following conditions  .Severe or worsening pain in your legs or feet while at rest or while walking .Increase pain, redness, warmth, or drainage (pus) from your incision site(s) . Fever of 101 degree or higher . The swelling in your leg with the bypass suddenly worsens and becomes  more painful than when you were in the hospital . If you have been instructed to feel your graft pulse then you should do so every day. If you can no longer feel this pulse, call the office immediately. Not all patients are given this instruction. .  Leg swelling is common after leg bypass surgery.  The swelling should improve over a few months following surgery. To improve the swelling, you may elevate  your legs above the level of your heart while you are sitting or resting. Your surgeon or physician assistant may ask you to apply an ACE wrap or wear compression (TED) stockings to help to reduce swelling.  Reduce your risk of vascular disease  Stop smoking. If you would like help call [facility] at 1-800-QUIT-NOW (512 444 9404) or [place] at [phone].  . Manage your cholesterol . Maintain a desired weight . Control your diabetes weight . Control your diabetes . Keep your blood pressure down .  If you have any questions, please call the office at [phone]

## 2020-08-21 ENCOUNTER — Other Ambulatory Visit: Payer: Self-pay

## 2020-08-21 DIAGNOSIS — I739 Peripheral vascular disease, unspecified: Secondary | ICD-10-CM

## 2020-08-25 ENCOUNTER — Telehealth: Payer: Self-pay

## 2020-08-25 NOTE — Telephone Encounter (Signed)
Patient called to report swelling in leg. He can move his leg and foot, denies pain, discoloration or temperature changes. He is concerned about the swelling 11 days out from surgery. Advised him it was normal and could occur for up to several months - he should continue to elevate extremity. He knows to call back if pain develops, or he is unable to move leg.

## 2020-08-25 NOTE — Telephone Encounter (Signed)
Opened in error

## 2020-09-09 ENCOUNTER — Encounter: Payer: BLUE CROSS/BLUE SHIELD | Admitting: Vascular Surgery

## 2020-09-16 ENCOUNTER — Ambulatory Visit (HOSPITAL_COMMUNITY)
Admission: RE | Admit: 2020-09-16 | Discharge: 2020-09-16 | Disposition: A | Discharge status: Home / Self Care | Payer: BLUE CROSS/BLUE SHIELD | Source: Ambulatory Visit | Attending: Vascular Surgery | Admitting: Vascular Surgery

## 2020-09-16 ENCOUNTER — Encounter: Payer: Self-pay | Admitting: Vascular Surgery

## 2020-09-16 ENCOUNTER — Ambulatory Visit (INDEPENDENT_AMBULATORY_CARE_PROVIDER_SITE_OTHER): Payer: Self-pay | Admitting: Vascular Surgery

## 2020-09-16 ENCOUNTER — Ambulatory Visit (INDEPENDENT_AMBULATORY_CARE_PROVIDER_SITE_OTHER)
Admission: RE | Admit: 2020-09-16 | Discharge: 2020-09-16 | Disposition: A | Discharge status: Home / Self Care | Payer: BLUE CROSS/BLUE SHIELD | Source: Ambulatory Visit | Attending: Vascular Surgery | Admitting: Vascular Surgery

## 2020-09-16 ENCOUNTER — Other Ambulatory Visit: Payer: Self-pay

## 2020-09-16 VITALS — BP 162/93 | HR 84 | Temp 98.4°F | Resp 20 | Ht 68.0 in | Wt 138.0 lb

## 2020-09-16 DIAGNOSIS — Z95828 Presence of other vascular implants and grafts: Secondary | ICD-10-CM

## 2020-09-16 DIAGNOSIS — I739 Peripheral vascular disease, unspecified: Secondary | ICD-10-CM

## 2020-09-16 NOTE — Progress Notes (Signed)
ASSESSMENT & PLAN:  60 y.o. male with status post right iliofemoral endarterectomy and RCFA to R BKPA bypass with 6mm ePTFE 08/14/20. Doing well. Duplex and ABI look good today. Incisions have healed. He may return to work when he is comfortable. Follow up with me in 3 months with duplex and ABI for bypass surveillance.   CHIEF COMPLAINT:   Right foot ulceration  HISTORY:  HISTORY OF PRESENT ILLNESS: Kirk CotaJohnny Carrillo is a 60 y.o. male with PAD with ulceration. He underwent angiography in November which demonstrated a TASC D SFA/popliteal lesion. He was offered bypass the next week, but elected to leave the hospital and schedule electively. He returns today for bypass.  09/16/20: Status post R iliofemoral endarterectomy and RCFA to R BKPA bypass with 6mm ePTFE. He did remarkably well and was discharged the next day. Oddly, I cannot directly access any of these old notes. He reports some swelling and "electric" discomfort in his foot. The wounds have almost completely healed. He is walking around his house without difficulty.   Past Medical History:  Diagnosis Date  . History of kidney stones   . Hypertension   . Iron deficiency anemia   . Malnutrition (HCC)   . Pneumonia   . Pre-diabetes   . PVD (peripheral vascular disease) (HCC)   . Tobacco abuse     Past Surgical History:  Procedure Laterality Date  .  right femoral to below-knee popliteal bypass with 6 mm ePTFE  08/14/2020  . ABDOMINAL AORTOGRAM W/LOWER EXTREMITY Bilateral 06/27/2020   Procedure: ABDOMINAL AORTOGRAM W/LOWER EXTREMITY;  Surgeon: Leonie DouglasHawken, Arya Luttrull N, MD;  Location: [facility];  Service: Cardiovascular;  Laterality: Bilateral;  . ENDARTERECTOMY FEMORAL Right 08/14/2020   Procedure: RIGHT ILIOFEMORAL ENDARTERECTOMY;  Surgeon: Leonie DouglasHawken, Pinchas Reither N, MD;  [facility];  Service: Vascular;  Laterality: Right;  . FEMORAL-POPLITEAL BYPASS GRAFT Right 08/14/2020   Procedure: RIGHT COMMON FEMORAL TO RIGHT BELOW KNEE  POPLITEAL BYPASS GRAFT;  Surgeon: Leonie DouglasHawken, Urania Pearlman N, MD;  [facility];  Service: Vascular;  Laterality: Right;  . right iliofemoral endarterectomy  08/14/2020    Family History  Problem Relation Age of Onset  . Breast cancer Mother     Social History   Socioeconomic History  . Marital status: Single    Spouse name: Not on file  . Number of children: Not on file  . Years of education: Not on file  . Highest education level: Not on file  Occupational History  . Not on file  Tobacco Use  . Smoking status: Current Every Day Smoker    Packs/day: 0.25  . Smokeless tobacco: Never Used  Vaping Use  . Vaping Use: Never used  Substance and Sexual Activity  . Alcohol use: Yes    Alcohol/week: 12.0 standard drinks    Types: 12 Cans of beer per week  . Drug use: No  . Sexual activity: Not on file  Other Topics Concern  . Not on file  Social History Narrative  . Not on file   Social Determinants of Health   Financial Resource Strain: Not on file  Food Insecurity: Not on file  Transportation Needs: Not on file  Physical Activity: Not on file  Stress: Not on file  Social Connections: Not on file  Intimate Partner Violence: Not on file    Allergies  Allergen Reactions  . Amlodipine Swelling  . Telmisartan Other (See Comments)    Facial; angioedema.     Current Outpatient Medications  Medication  Sig Dispense Refill  . aspirin EC 81 MG tablet Take 1 tablet (81 mg total) by mouth daily. Swallow whole. 90 tablet 0  . Bioflavonoid Products (ESTER-C) 500-550 MG TABS Take 500 mg by mouth daily.    . diphenhydrAMINE (BENADRYL) 25 mg capsule Take 25 mg by mouth 2 (two) times daily.    . ferrous sulfate 325 (65 FE) MG tablet Take 1 tablet (325 mg total) by mouth 3 (three) times daily with meals. (Patient taking differently: Take 325 mg by mouth 2 (two) times daily.) 270 tablet 0  . hydrochlorothiazide (HYDRODIURIL) 25 MG tablet Take 1 tablet (25 mg total) by mouth daily. 30  tablet 2  . ibuprofen (ADVIL) 200 MG tablet Take 400 mg by mouth every 6 (six) hours as needed for moderate pain.    . Menthol, Topical Analgesic, (BENGAY EX) Apply 1 application topically daily.    . Menthol, Topical Analgesic, (ICY HOT EX) Apply 1 application topically daily.    . Naphazoline HCl (CLEAR EYES OP) Place 1 drop into both eyes daily as needed (irritation).    . rosuvastatin (CRESTOR) 20 MG tablet Take 1 tablet (20 mg total) by mouth daily. 90 tablet 3  . Silver-Carboxymethylcellulose (AQUACEL AG ADVANTAGE) 4"X5" PADS Apply 1 application topically daily. 90 each 0  . vitamin B-12 1000 MCG tablet Take 1 tablet (1,000 mcg total) by mouth daily. 90 tablet 0  . albuterol (PROAIR HFA) 108 (90 Base) MCG/ACT inhaler Inhale 1-2 puffs into the lungs every 6 (six) hours as needed for wheezing or shortness of breath. (Patient not taking: No sig reported) 8 g 0  . oxyCODONE-acetaminophen (PERCOCET) 5-325 MG tablet Take 1 tablet by mouth every 6 (six) hours as needed for severe pain. (Patient not taking: Reported on 09/16/2020) 20 tablet 0   No current facility-administered medications for this visit.    REVIEW OF SYSTEMS:  [X]  denotes positive finding, [ ]  denotes negative finding Cardiac  Comments:  Chest pain or chest pressure:    Shortness of breath upon exertion:    Short of breath when lying flat:    Irregular heart rhythm:        Vascular    Pain in calf, thigh, or hip brought on by ambulation:    Pain in feet at night that wakes you up from your sleep:     Blood clot in your veins:    Leg swelling:         Pulmonary    Oxygen at home:    Productive cough:     Wheezing:         Neurologic    Sudden weakness in arms or legs:     Sudden numbness in arms or legs:     Sudden onset of difficulty speaking or slurred speech:    Temporary loss of vision in one eye:     Problems with dizziness:         Gastrointestinal    Blood in stool:     Vomited blood:          Genitourinary    Burning when urinating:     Blood in urine:        Psychiatric    Major depression:         Hematologic    Bleeding problems:    Problems with blood clotting too easily:        Skin    Rashes or ulcers:        Constitutional  Fever or chills:     PHYSICAL EXAM:   Vitals:   09/16/20 1456  BP: (!) 162/93  Pulse: 84  Resp: 20  Temp: 98.4 F (36.9 C)  SpO2: 96%  Weight: 138 lb (62.6 kg)  Height: 5\' 8"  (1.727 m)   Constitutional: well appearing in no distress. Appears well nourished.  Neurologic: CN intact. No focal findings. No sensory loss. Psychiatric: Mood and affect symmetric and appropriate. Eyes: No icterus. No conjunctival pallor. Ears, nose, throat: mucous membranes moist. Midline trachea.  Cardiac: Normal rate and rhythm.  Respiratory: unlabored. Abdominal: soft, non-tender, non-distended.  Peripheral vascular:  Warm, well perfused R foot  <69mm healing ulcer interdigital ulcer on lateral R fourth toe  R fifth toe healed completely  <2s capillary refill  R groin and below knee incisions healed Extremity: No edema. No cyanosis. No pallor.  Skin: No gangrene. + ulceration.  Lymphatic: No Stemmer's sign. No palpable lymphadenopathy.   DATA REVIEW:    Most recent CBC CBC Latest Ref Rng & Units 08/15/2020 08/14/2020 08/12/2020  WBC 4.0 - 10.5 K/uL 9.9 8.5 6.4  Hemoglobin 13.0 - 17.0 g/dL 12.0(L) 12.7(L) 14.4  Hematocrit 39.0 - 52.0 % 39.2 40.5 46.4  Platelets 150 - 400 K/uL 330 311 355     Most recent CMP CMP Latest Ref Rng & Units 08/15/2020 08/14/2020 08/12/2020  Glucose 70 - 99 mg/dL 10/10/2020) - 91  BUN 8 - 23 mg/dL 9 - 7(L)  Creatinine 865(H - 1.24 mg/dL 8.46) 9.62(X) 5.28(U)  Sodium 135 - 145 mmol/L 136 - 133(L)  Potassium 3.5 - 5.1 mmol/L 3.2(L) - 3.6  Chloride 98 - 111 mmol/L 101 - 97(L)  CO2 22 - 32 mmol/L 24 - 25  Calcium 8.9 - 10.3 mg/dL 1.32(G) - 9.4  Total Protein 6.5 - 8.1 g/dL - - 7.1  Total Bilirubin 0.3 - 1.2 mg/dL - -  0.6  Alkaline Phos 38 - 126 U/L - - 90  AST 15 - 41 U/L - - 28  ALT 0 - 44 U/L - - 14    Renal function CrCl cannot be calculated (Patient's most recent lab result is older than the maximum 21 days allowed.).  Hgb A1c MFr Bld (%)  Date Value  06/27/2020 6.2 (H)    LDL Cholesterol  Date Value Ref Range Status  06/28/2020 50 0 - 99 mg/dL Final    Comment:           Total Cholesterol/HDL:CHD Risk Coronary Heart Disease Risk Table                     Men   Women  1/2 Average Risk   3.4   3.3  Average Risk       5.0   4.4  2 X Average Risk   9.6   7.1  3 X Average Risk  23.4   11.0        Use the calculated Patient Ratio above and the CHD Risk Table to determine the patient's CHD Risk.        ATP III CLASSIFICATION (LDL):  <100     mg/dL   Optimal  06/30/2020  mg/dL   Near or Above                    Optimal  130-159  mg/dL   Borderline  027-253  mg/dL   High  664-403     mg/dL   Very High Performed at [facility]  Hospital Lab, [address], [place], [place] [address]     ABI 09/16/20  Arterial Duplex  09/16/20   Rande Brunt. Lenell Antu, MD [facility] Phone Number: [phone] 09/16/2020 3:05 PM

## 2020-09-17 ENCOUNTER — Other Ambulatory Visit: Payer: Self-pay

## 2020-09-17 DIAGNOSIS — Z95828 Presence of other vascular implants and grafts: Secondary | ICD-10-CM

## 2020-12-09 ENCOUNTER — Ambulatory Visit (HOSPITAL_COMMUNITY): Payer: BLUE CROSS/BLUE SHIELD

## 2020-12-09 ENCOUNTER — Ambulatory Visit: Payer: BLUE CROSS/BLUE SHIELD | Admitting: Vascular Surgery

## 2021-01-11 NOTE — Progress Notes (Deleted)
ASSESSMENT & PLAN:  60 y.o. male with status post right iliofemoral endarterectomy and RCFA to R BKPA bypass with 27mm ePTFE 08/14/20. Duplex and ABI today show ***. Follow up with me in 6 months with duplex and ABI for bypass surveillance.   CHIEF COMPLAINT:   Right foot ulceration  HISTORY:  HISTORY OF PRESENT ILLNESS: Kirk Carrillo is a 60 y.o. male with PAD with ulceration. He underwent angiography in November which demonstrated a TASC D SFA/popliteal lesion. He was offered bypass the next week, but elected to leave the hospital and schedule electively. He returns today for bypass.  09/16/20: Status post R iliofemoral endarterectomy and RCFA to R BKPA bypass with 57mm ePTFE. He did remarkably well and was discharged the next day. Oddly, I cannot directly access any of these old notes. He reports some swelling and "electric" discomfort in his foot. The wounds have almost completely healed. He is walking around his house without difficulty.   01/11/21: ***  Past Medical History:  Diagnosis Date   History of kidney stones    Hypertension    Iron deficiency anemia    Malnutrition (HCC)    Pneumonia    Pre-diabetes    PVD (peripheral vascular disease) (HCC)    Tobacco abuse     Past Surgical History:  Procedure Laterality Date    right femoral to below-knee popliteal bypass with 6 mm ePTFE  08/14/2020   ABDOMINAL AORTOGRAM W/LOWER EXTREMITY Bilateral 06/27/2020   Procedure: ABDOMINAL AORTOGRAM W/LOWER EXTREMITY;  Surgeon: Leonie Douglas, MD;  Location: [facility];  Service: Cardiovascular;  Laterality: Bilateral;   ENDARTERECTOMY FEMORAL Right 08/14/2020   Procedure: RIGHT ILIOFEMORAL ENDARTERECTOMY;  Surgeon: Leonie Douglas, MD;  Location: [facility] OR;  Service: Vascular;  Laterality: Right;   FEMORAL-POPLITEAL BYPASS GRAFT Right 08/14/2020   Procedure: RIGHT COMMON FEMORAL TO RIGHT BELOW KNEE POPLITEAL BYPASS GRAFT;  Surgeon: Leonie Douglas, MD;  Location: [facility] OR;  Service:  Vascular;  Laterality: Right;   right iliofemoral endarterectomy  08/14/2020    Family History  Problem Relation Age of Onset   Breast cancer Mother     Social History   Socioeconomic History   Marital status: Single    Spouse name: Not on file   Number of children: Not on file   Years of education: Not on file   Highest education level: Not on file  Occupational History   Not on file  Tobacco Use   Smoking status: Every Day    Packs/day: 0.25    Pack years: 0.00    Types: Cigarettes   Smokeless tobacco: Never  Vaping Use   Vaping Use: Never used  Substance and Sexual Activity   Alcohol use: Yes    Alcohol/week: 12.0 standard drinks    Types: 12 Cans of beer per week   Drug use: No   Sexual activity: Not on file  Other Topics Concern   Not on file  Social History Narrative   Not on file   Social Determinants of Health   Financial Resource Strain: Not on file  Food Insecurity: Not on file  Transportation Needs: Not on file  Physical Activity: Not on file  Stress: Not on file  Social Connections: Not on file  Intimate Partner Violence: Not on file    Allergies  Allergen Reactions   Amlodipine Swelling   Telmisartan Other (See Comments)    Facial; angioedema.     Current Outpatient Medications  Medication Sig Dispense Refill  albuterol (PROAIR HFA) 108 (90 Base) MCG/ACT inhaler Inhale 1-2 puffs into the lungs every 6 (six) hours as needed for wheezing or shortness of breath. (Patient not taking: No sig reported) 8 g 0   Bioflavonoid Products (ESTER-C) 500-550 MG TABS Take 500 mg by mouth daily.     diphenhydrAMINE (BENADRYL) 25 mg capsule Take 25 mg by mouth 2 (two) times daily.     ferrous sulfate 325 (65 FE) MG tablet Take 1 tablet (325 mg total) by mouth 3 (three) times daily with meals. (Patient taking differently: Take 325 mg by mouth 2 (two) times daily.) 270 tablet 0   hydrochlorothiazide (HYDRODIURIL) 25 MG tablet Take 1 tablet (25 mg total) by  mouth daily. 30 tablet 2   ibuprofen (ADVIL) 200 MG tablet Take 400 mg by mouth every 6 (six) hours as needed for moderate pain.     Menthol, Topical Analgesic, (BENGAY EX) Apply 1 application topically daily.     Menthol, Topical Analgesic, (ICY HOT EX) Apply 1 application topically daily.     Naphazoline HCl (CLEAR EYES OP) Place 1 drop into both eyes daily as needed (irritation).     oxyCODONE-acetaminophen (PERCOCET) 5-325 MG tablet Take 1 tablet by mouth every 6 (six) hours as needed for severe pain. (Patient not taking: Reported on 09/16/2020) 20 tablet 0   rosuvastatin (CRESTOR) 20 MG tablet Take 1 tablet (20 mg total) by mouth daily. 90 tablet 3   Silver-Carboxymethylcellulose (AQUACEL AG ADVANTAGE) 4"X5" PADS Apply 1 application topically daily. 90 each 0   No current facility-administered medications for this visit.    REVIEW OF SYSTEMS:  [X]  denotes positive finding, [ ]  denotes negative finding Cardiac  Comments:  Chest pain or chest pressure:    Shortness of breath upon exertion:    Short of breath when lying flat:    Irregular heart rhythm:        Vascular    Pain in calf, thigh, or hip brought on by ambulation:    Pain in feet at night that wakes you up from your sleep:     Blood clot in your veins:    Leg swelling:         Pulmonary    Oxygen at home:    Productive cough:     Wheezing:         Neurologic    Sudden weakness in arms or legs:     Sudden numbness in arms or legs:     Sudden onset of difficulty speaking or slurred speech:    Temporary loss of vision in one eye:     Problems with dizziness:         Gastrointestinal    Blood in stool:     Vomited blood:         Genitourinary    Burning when urinating:     Blood in urine:        Psychiatric    Major depression:         Hematologic    Bleeding problems:    Problems with blood clotting too easily:        Skin    Rashes or ulcers:        Constitutional    Fever or chills:     PHYSICAL  EXAM:   There were no vitals filed for this visit.  Constitutional: well appearing in no distress. Appears well nourished.  Neurologic: CN intact. No focal findings. No sensory loss. Psychiatric: Mood and affect  symmetric and appropriate. Eyes: No icterus. No conjunctival pallor. Ears, nose, throat: mucous membranes moist. Midline trachea.  Cardiac: Normal rate and rhythm.  Respiratory: unlabored. Abdominal: soft, non-tender, non-distended.  Peripheral vascular:  Warm, well perfused R foot  <68mm healing ulcer interdigital ulcer on lateral R fourth toe  R fifth toe healed completely  <2s capillary refill  R groin and below knee incisions healed Extremity: No edema. No cyanosis. No pallor.  Skin: No gangrene. + ulceration.  Lymphatic: No Stemmer's sign. No palpable lymphadenopathy.   DATA REVIEW:    Most recent CBC CBC Latest Ref Rng & Units 08/15/2020 08/14/2020 08/12/2020  WBC 4.0 - 10.5 K/uL 9.9 8.5 6.4  Hemoglobin 13.0 - 17.0 g/dL 12.0(L) 12.7(L) 14.4  Hematocrit 39.0 - 52.0 % 39.2 40.5 46.4  Platelets 150 - 400 K/uL 330 311 355     Most recent CMP CMP Latest Ref Rng & Units 08/15/2020 08/14/2020 08/12/2020  Glucose 70 - 99 mg/dL 211(H) - 91  BUN 8 - 23 mg/dL 9 - 7(L)  Creatinine 4.17 - 1.24 mg/dL 4.08(X) 4.48(J) 8.56(D)  Sodium 135 - 145 mmol/L 136 - 133(L)  Potassium 3.5 - 5.1 mmol/L 3.2(L) - 3.6  Chloride 98 - 111 mmol/L 101 - 97(L)  CO2 22 - 32 mmol/L 24 - 25  Calcium 8.9 - 10.3 mg/dL 1.4(H) - 9.4  Total Protein 6.5 - 8.1 g/dL - - 7.1  Total Bilirubin 0.3 - 1.2 mg/dL - - 0.6  Alkaline Phos 38 - 126 U/L - - 90  AST 15 - 41 U/L - - 28  ALT 0 - 44 U/L - - 14    Hgb A1c MFr Bld (%)  Date Value  06/27/2020 6.2 (H)    LDL Cholesterol  Date Value Ref Range Status  06/28/2020 50 0 - 99 mg/dL Final    Comment:           Total Cholesterol/HDL:CHD Risk Coronary Heart Disease Risk Table                     Men   Women  1/2 Average Risk   3.4   3.3  Average Risk        5.0   4.4  2 X Average Risk   9.6   7.1  3 X Average Risk  23.4   11.0        Use the calculated Patient Ratio above and the CHD Risk Table to determine the patient's CHD Risk.        ATP III CLASSIFICATION (LDL):  <100     mg/dL   Optimal  702-637  mg/dL   Near or Above                    Optimal  130-159  mg/dL   Borderline  858-850  mg/dL   High  >277     mg/dL   Very High Performed at [facility], [address], [place], [place] [address]     ABI 01/11/21: ***  Arterial Duplex 01/11/21: Rande Brunt. Lenell Antu, MD [facility] Phone Number: [phone] 01/11/2021 9:00 PM

## 2021-01-13 ENCOUNTER — Encounter (HOSPITAL_COMMUNITY): Payer: BLUE CROSS/BLUE SHIELD

## 2021-01-13 ENCOUNTER — Ambulatory Visit: Payer: BLUE CROSS/BLUE SHIELD | Admitting: Vascular Surgery

## 2021-01-13 ENCOUNTER — Other Ambulatory Visit (HOSPITAL_COMMUNITY): Payer: BLUE CROSS/BLUE SHIELD

## 2021-01-14 ENCOUNTER — Telehealth: Payer: Self-pay

## 2021-01-14 NOTE — Telephone Encounter (Signed)
Patient calls today "distraught" about apparent continued efforts to collect money for his surgery. He says he has paid 60 thousand dollars in the last six months. I advised him our  about office had nothing to do with billing, and I gave him the number for hospital billing. He thinks that may be the number he called before and states "she was rude, obnoxious and a piece of crap - if I were her boss I would fire her". He is also concerned about his surgery not working to fix his leg and cancelled his post op appointment because "you're just [place] charge me another 2,000 dollars." I encouraged him to call the billing number to discuss options. He is going to call them.

## 2021-02-17 ENCOUNTER — Ambulatory Visit: Payer: BLUE CROSS/BLUE SHIELD | Admitting: Vascular Surgery

## 2021-02-17 ENCOUNTER — Other Ambulatory Visit (HOSPITAL_COMMUNITY): Payer: BLUE CROSS/BLUE SHIELD

## 2021-02-17 ENCOUNTER — Encounter (HOSPITAL_COMMUNITY): Payer: BLUE CROSS/BLUE SHIELD

## 2021-07-02 IMAGING — CT CT PERC PLEURAL DRAIN W/INDWELL CATH W/IMG GUIDE
1 of 3 series · 13 of 32 positions shown, 18 images · non-contrast
Comparison: none

INDICATION: 59-year-old with a loculated right pleural effusion and only a small
amount of fluid could be removed during thoracentesis. Request for a
large-bore chest tube. Patient also has cavitary lesions in the
right lung.

[Series 2: i-spiral 5.0 b40f · axial · 0.97mm/px · z∈[+1221,+1497]mm · 13 of 89 slices shown, 18 images]
[im 5/89  soft-tissue]
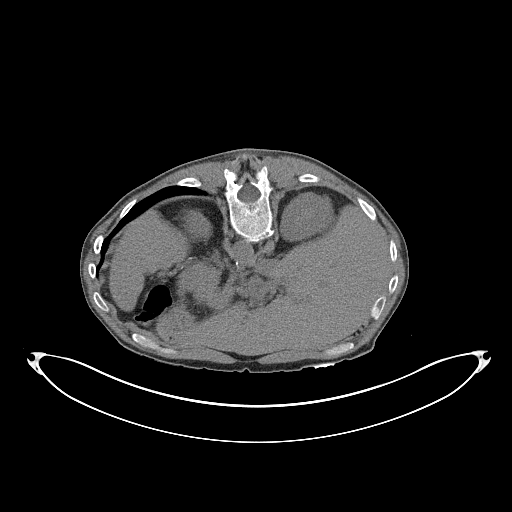
[im 5/89  bone]
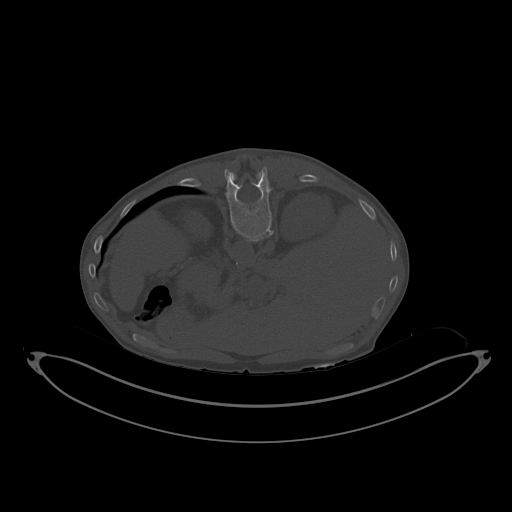
[im 15/89  soft-tissue]
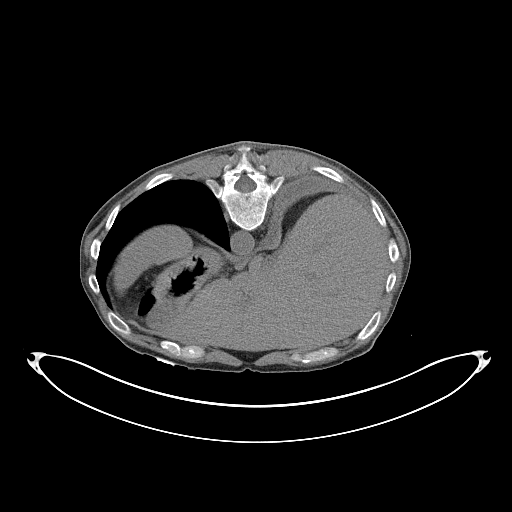
[im 20/89  soft-tissue]
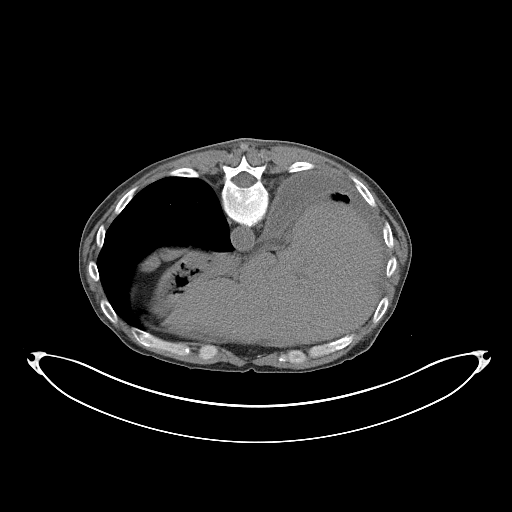
[im 25/89  soft-tissue]
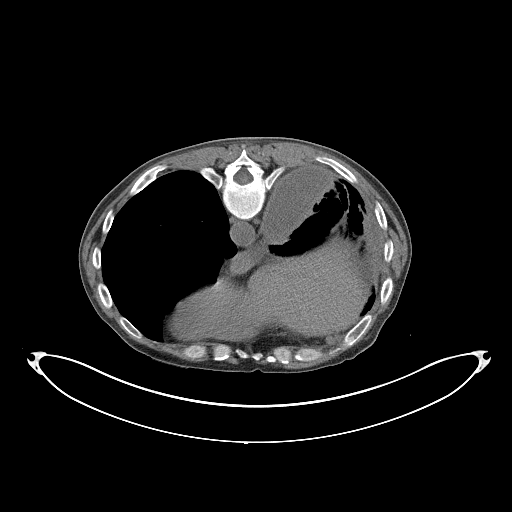
[im 35/89  soft-tissue]
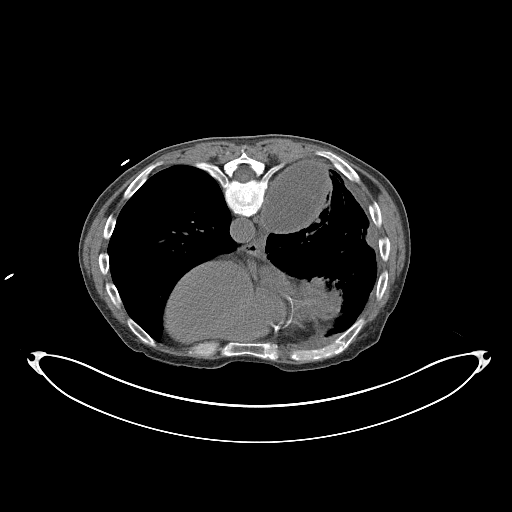
[im 40/89  soft-tissue]
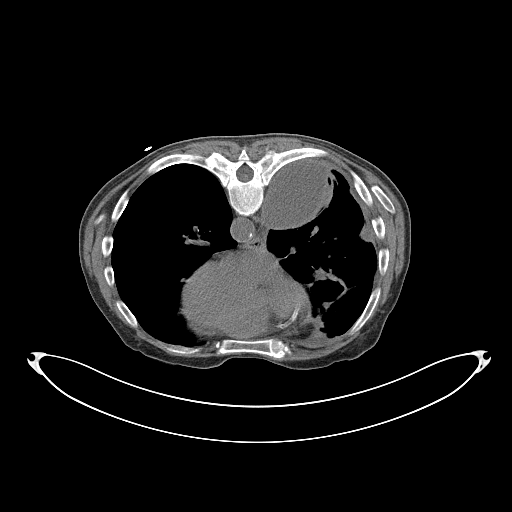
[im 49/89  soft-tissue]
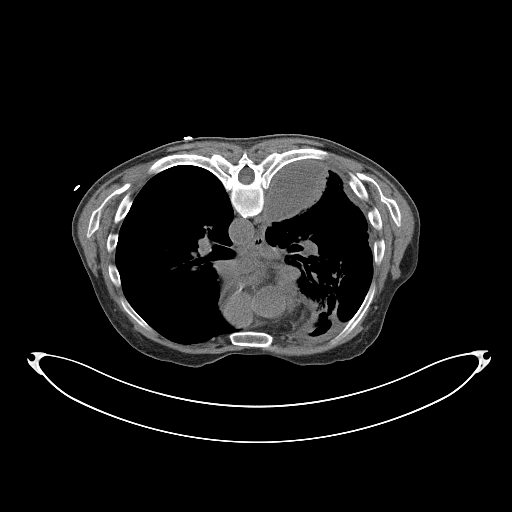
[im 54/89  soft-tissue]
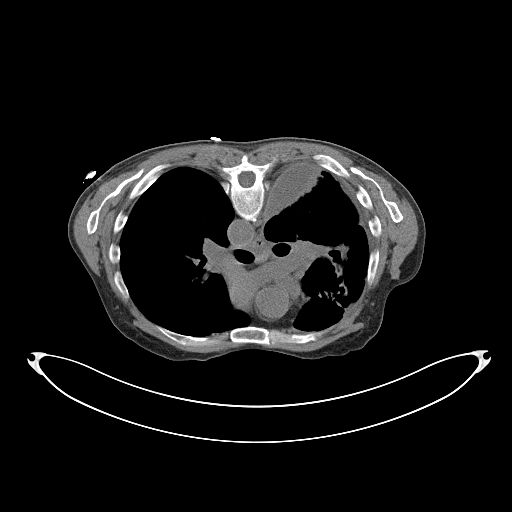
[im 64/89  soft-tissue]
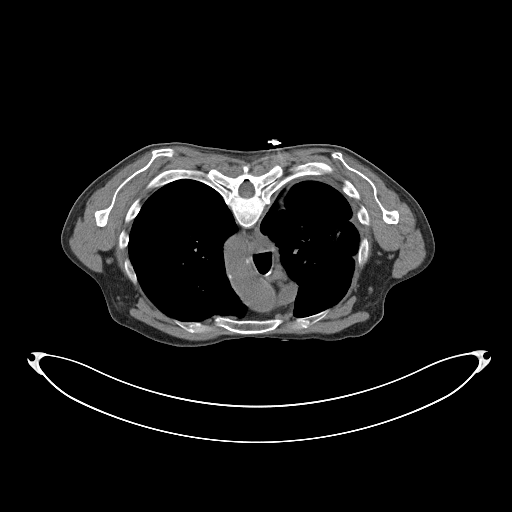
[im 64/89  bone]
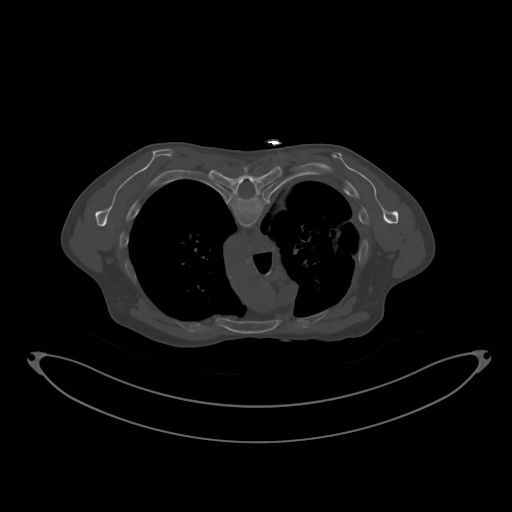
[im 69/89  soft-tissue]
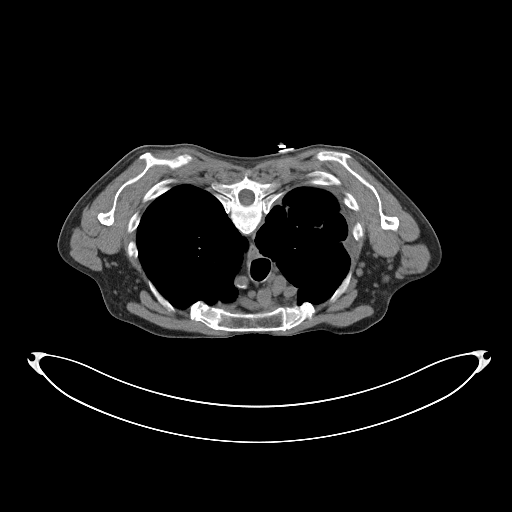
[im 69/89  lung]
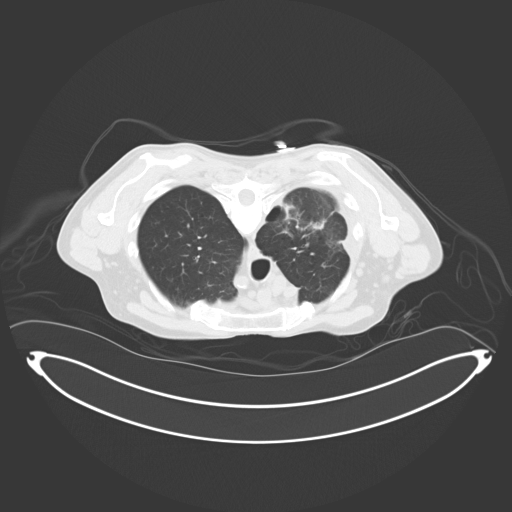
[im 74/89  soft-tissue]
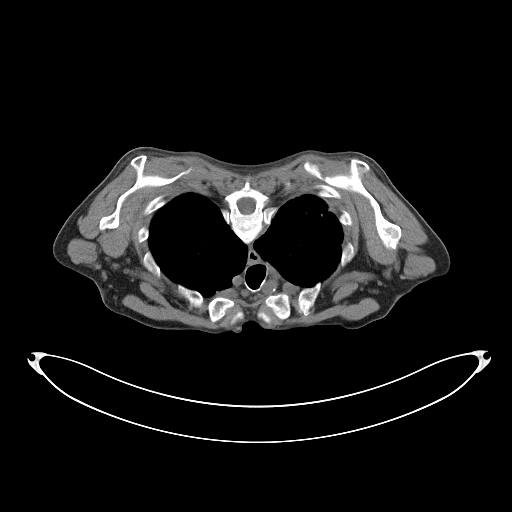
[im 74/89  lung]
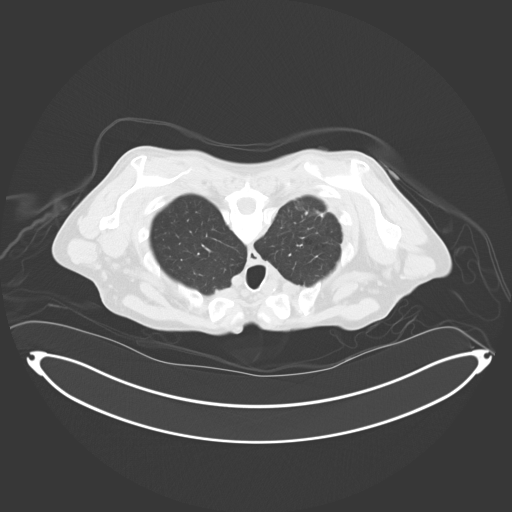
[im 79/89  lung]
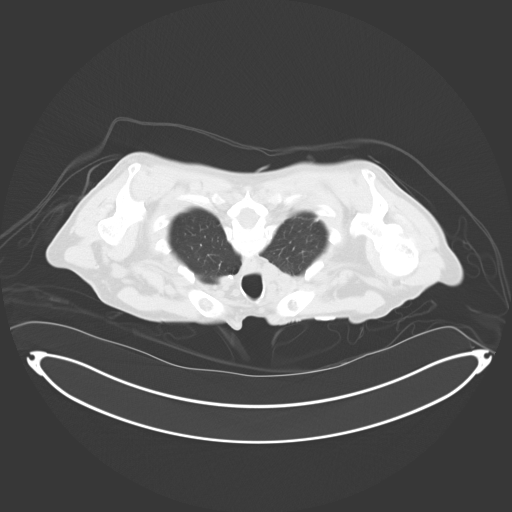
[im 84/89  soft-tissue]
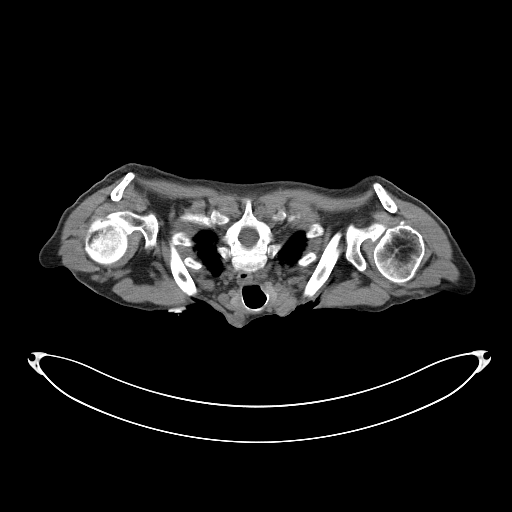
[im 84/89  lung]
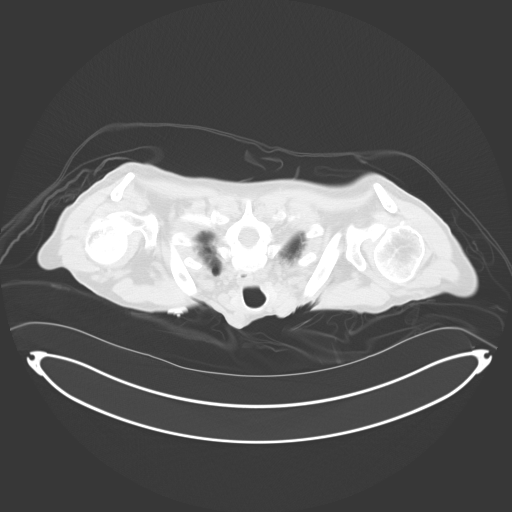

[13 of 32 positions shown; findings below may reference images not displayed]

EXAM:
CT-GUIDED RIGHT CHEST TUBE PLACEMENT

MEDICATIONS:
Moderate sedation

ANESTHESIA/SEDATION:
Fentanyl 50 mcg IV; Versed 1.5 mg IV

Moderate Sedation Time:  23 minutes

The patient was continuously monitored during the procedure by the
interventional radiology nurse under my direct supervision.

COMPLICATIONS:
None immediate.

PROCEDURE:
Informed written consent was obtained from the patient after a
thorough discussion of the procedural risks, benefits and
alternatives. All questions were addressed. Maximal Sterile Barrier
Technique was utilized including caps, mask, sterile gowns, sterile
gloves, sterile drape, hand hygiene and skin antiseptic. A timeout
was performed prior to the initiation of the procedure.

Patient was placed prone. CT images through the chest were obtained.
The loculated posterior right pleural effusion was identified and
targeted. The right side of the back was prepped with chlorhexidine
and sterile field was created. Skin was anesthetized using 1%
lidocaine. Using CT guidance, a Yueh catheter was directed into the
loculated effusion and a small amount of yellow fluid was aspirated.
A J wire was advanced into the pleural space and the tract was
dilated to accommodate a 16 Gab Tiger. Drain was
advanced over the wire. Approximately 20 mL of cloudy grayish
colored purulent fluid was aspirated. Drain was sutured in place and
attached to a chest drainage system. Minimal fluid was draining
after placement to the suction.
FINDINGS: Loculated posterior right pleural effusion. 16 French drain was
successfully placed within the loculated effusion. Approximately 20
mL of purulent looking fluid was removed and findings are suggestive
for an empyema.
IMPRESSION: CT-guided placement of a chest tube within the right chest empyema.
Fluid was sent for culture.

## 2021-07-05 IMAGING — DX DG CHEST 1V PORT
1 series · 2 of 2 positions shown · non-contrast
Comparison: Portable chest 06/26/2020 and earlier including chest
CT 06/20/2020.

CLINICAL DATA: 59-year-old male with empyema status post CT-guided
chest tube placement, pleural fibrinolytic administration.

EXAM:
PORTABLE CHEST 1 VIEW

[Series 1: chest · 0.14mm/px · 2 of 2 slices shown]
[im 1/2]
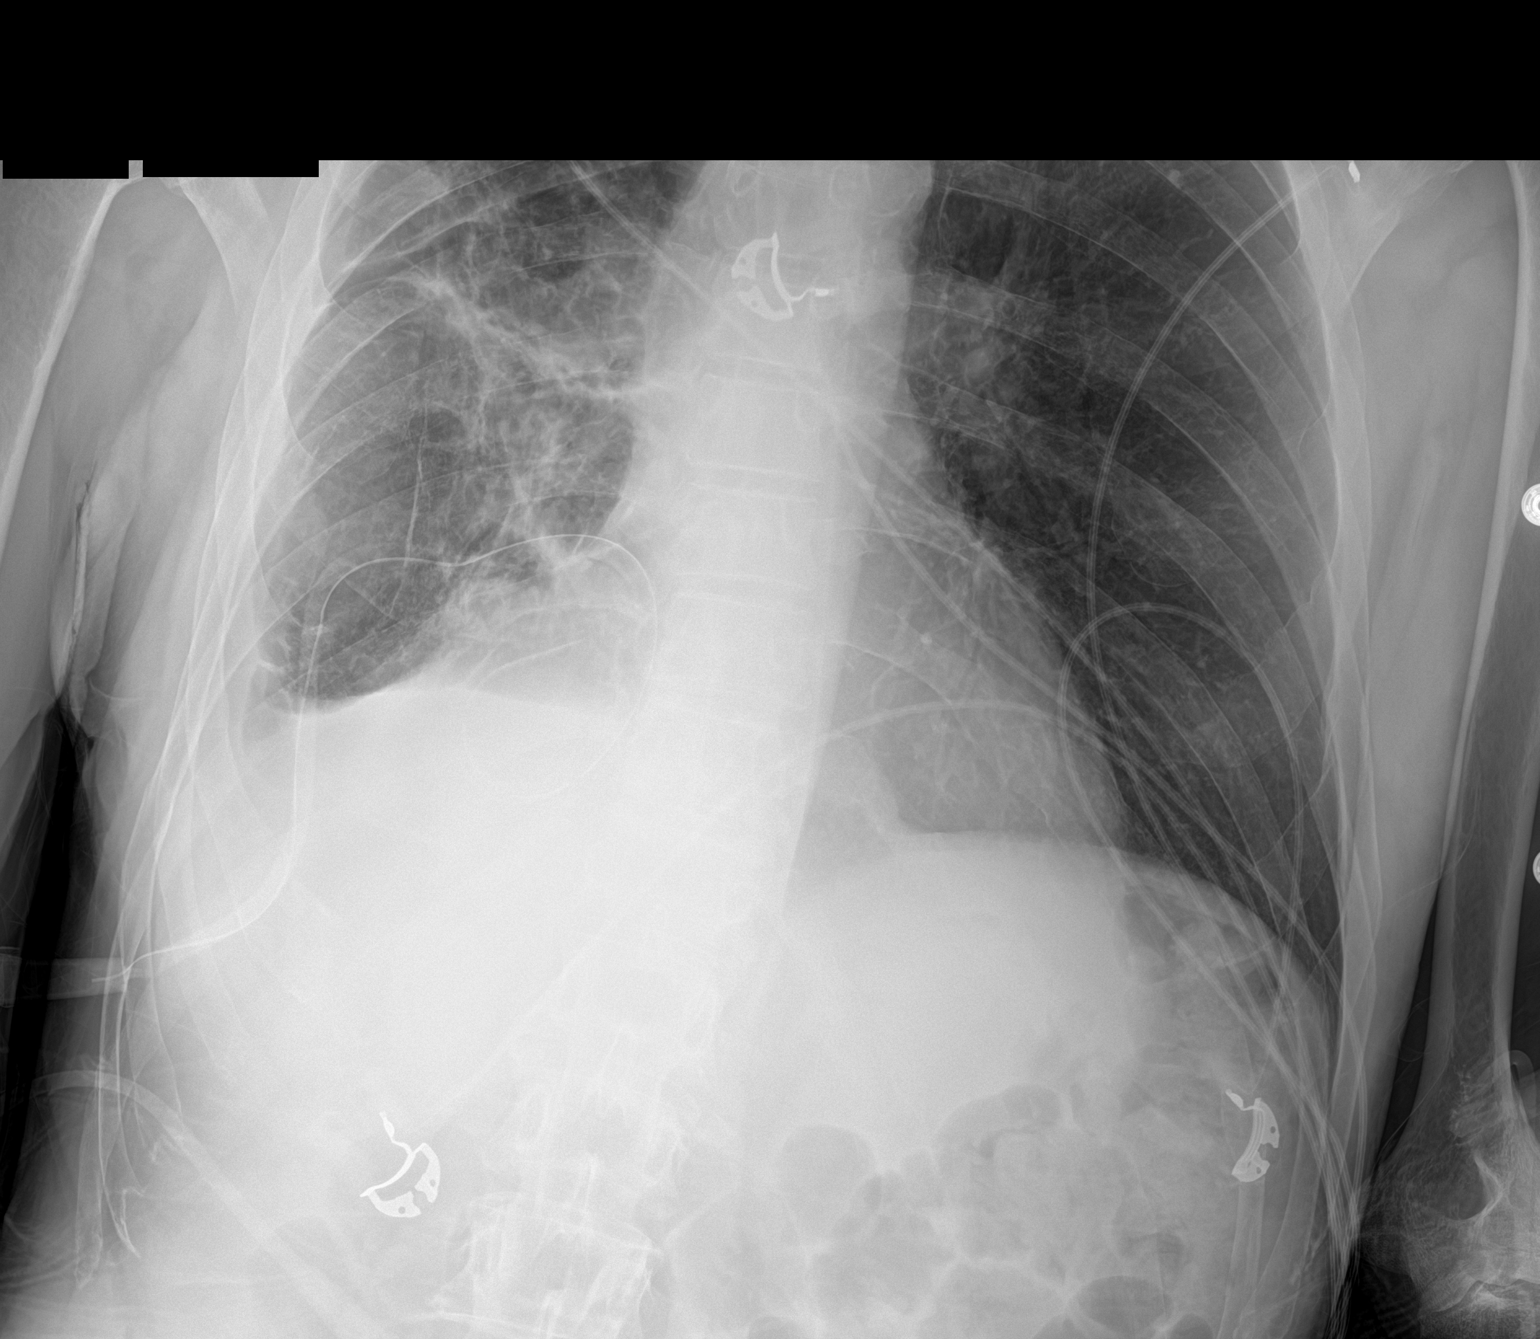
[im 2/2]
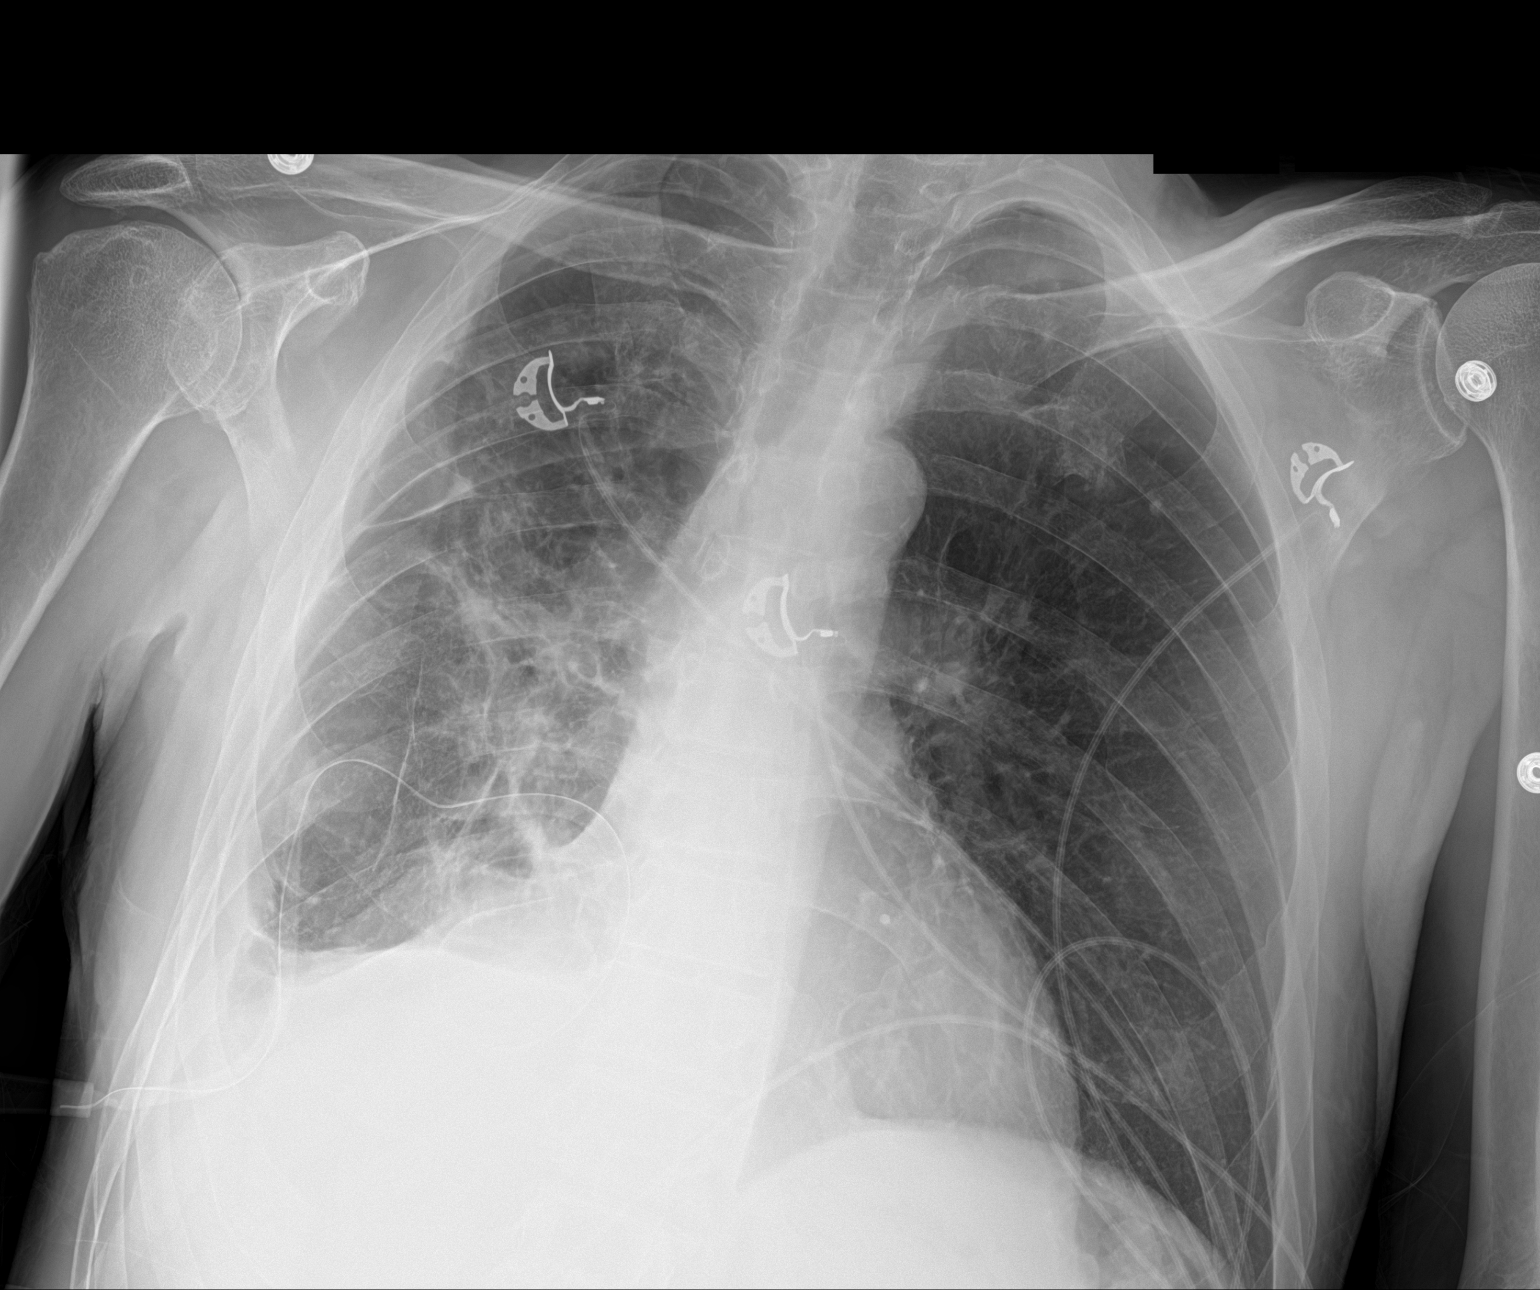

[2 of 2 positions shown; findings below may reference images not displayed]

FINDINGS: Portable AP semi upright view at 4692 hours. Right side chest tube
remains in place. No pneumothorax identified. Mildly larger lung
volumes. Stable cardiac size and mediastinal contours. Residual
streaky right perihilar and confluent right lung base opacity,
possibly with some re-accumulation of pleural fluid compared to
06/25/2020. Stable left lung, tiny calcified left upper lobe
granuloma.
IMPRESSION: 1. Right chest tube remains in place with no pneumothorax.
2. Stable residual right lung opacity, possibly mild re-accumulation
of pleural fluid since 06/25/2020.

## 2021-09-18 DIAGNOSIS — Z760 Encounter for issue of repeat prescription: Secondary | ICD-10-CM | POA: Diagnosis not present

## 2021-09-18 DIAGNOSIS — I10 Essential (primary) hypertension: Secondary | ICD-10-CM | POA: Diagnosis not present

## 2022-02-25 ENCOUNTER — Ambulatory Visit: Payer: BLUE CROSS/BLUE SHIELD | Admitting: Family Medicine

## 2022-03-09 ENCOUNTER — Emergency Department (HOSPITAL_COMMUNITY)
Admission: EM | Admit: 2022-03-09 | Discharge: 2022-04-02 | Disposition: E | Payer: 59 | Attending: Emergency Medicine | Admitting: Emergency Medicine

## 2022-03-09 DIAGNOSIS — I469 Cardiac arrest, cause unspecified: Secondary | ICD-10-CM | POA: Insufficient documentation

## 2022-03-09 DIAGNOSIS — I10 Essential (primary) hypertension: Secondary | ICD-10-CM | POA: Insufficient documentation

## 2022-03-09 DIAGNOSIS — R1084 Generalized abdominal pain: Secondary | ICD-10-CM | POA: Diagnosis not present

## 2022-03-09 DIAGNOSIS — I499 Cardiac arrhythmia, unspecified: Secondary | ICD-10-CM | POA: Diagnosis not present

## 2022-03-09 DIAGNOSIS — R739 Hyperglycemia, unspecified: Secondary | ICD-10-CM | POA: Diagnosis not present

## 2022-03-09 DIAGNOSIS — Z743 Need for continuous supervision: Secondary | ICD-10-CM | POA: Diagnosis not present

## 2022-03-09 DIAGNOSIS — R4020 Unspecified coma: Secondary | ICD-10-CM | POA: Diagnosis not present

## 2022-03-09 DIAGNOSIS — R69 Illness, unspecified: Secondary | ICD-10-CM | POA: Diagnosis not present

## 2022-03-09 DIAGNOSIS — F17210 Nicotine dependence, cigarettes, uncomplicated: Secondary | ICD-10-CM | POA: Insufficient documentation

## 2022-03-09 DIAGNOSIS — R404 Transient alteration of awareness: Secondary | ICD-10-CM | POA: Diagnosis not present

## 2022-03-09 LAB — I-STAT CHEM 8, ED
BUN: 4 mg/dL — ABNORMAL LOW (ref 8–23)
Calcium, Ion: 0.88 mmol/L — CL (ref 1.15–1.40)
Chloride: 91 mmol/L — ABNORMAL LOW (ref 98–111)
Creatinine, Ser: 1.2 mg/dL (ref 0.61–1.24)
Glucose, Bld: 152 mg/dL — ABNORMAL HIGH (ref 70–99)
HCT: 44 % (ref 39.0–52.0)
Hemoglobin: 15 g/dL (ref 13.0–17.0)
Potassium: 4.2 mmol/L (ref 3.5–5.1)
Sodium: 119 mmol/L — CL (ref 135–145)
TCO2: 15 mmol/L — ABNORMAL LOW (ref 22–32)

## 2022-03-09 LAB — CBG MONITORING, ED: Glucose-Capillary: 155 mg/dL — ABNORMAL HIGH (ref 70–99)

## 2022-03-09 MED ORDER — FENTANYL BOLUS VIA INFUSION: 50.0000 ug | INTRAVENOUS | Status: DC | PRN

## 2022-03-09 MED ORDER — FENTANYL CITRATE PF 50 MCG/ML IJ SOSY: 50.0000 ug | PREFILLED_SYRINGE | Freq: Once | INTRAMUSCULAR | Status: DC

## 2022-03-09 MED ORDER — FENTANYL 2500MCG IN NS 250ML (10MCG/ML) PREMIX INFUSION
50.0000 ug/h | INTRAVENOUS | Status: DC
  Administered 2022-03-10: 50 ug/h via INTRAVENOUS
  Filled 2022-03-09: qty 250

## 2022-03-10 ENCOUNTER — Emergency Department (HOSPITAL_COMMUNITY): Payer: 59

## 2022-03-10 ENCOUNTER — Encounter (HOSPITAL_COMMUNITY): Payer: Self-pay

## 2022-03-10 ENCOUNTER — Other Ambulatory Visit: Payer: Self-pay

## 2022-03-10 MED ORDER — SODIUM BICARBONATE 8.4 % IV SOLN
INTRAVENOUS | Status: AC | PRN
  Administered 2022-03-09 (×4): 50 meq via INTRAVENOUS

## 2022-03-10 MED ORDER — EPINEPHRINE 1 MG/10ML IJ SOSY
PREFILLED_SYRINGE | INTRAMUSCULAR | Status: AC | PRN
  Administered 2022-03-09 – 2022-03-10 (×4): 1 mg via INTRAVENOUS

## 2022-03-22 ENCOUNTER — Ambulatory Visit: Payer: BLUE CROSS/BLUE SHIELD | Admitting: Family Medicine

## 2022-04-02 NOTE — Code Documentation (Signed)
Patient time of death occurred at 39.

## 2022-04-02 NOTE — ED Provider Notes (Signed)
Emergency Department Provider Note   I have reviewed the triage vital signs and the nursing notes.   HISTORY  Chief Complaint Cardiac Arrest   HPI Kirk Carrillo is a 61 y.o. male with a history of anemia, hypertension and peripheral vascular disease that presents to the ER today secondary to cardiac arrest.  Report received from EMS.  Patient was having some vomiting diarrhea throughout the day today but otherwise not doing too bad and started having some left-sided pain.  Unspecified location.  The friends went outside to smoke a cigarette and they heard a thump.  They went back and there found the patient unresponsive.  On fire arrival patient was pulseless and apneic.  CPR was started epi was given.  On paramedics arrival he was in PEA.  And route patient had ROSC twice with sinus tachycardia for short periods of time.  On arrival here he was actively receiving chest compressions per LEVEL V CAVEAT APPLIES SECONDARY TO cardiac arrest  Past Medical History:  Diagnosis Date   History of kidney stones    Hypertension    Iron deficiency anemia    Malnutrition (HCC)    Pneumonia    Pre-diabetes    PVD (peripheral vascular disease) (HCC)    Tobacco abuse     Patient Active Problem List   Diagnosis Date Noted   PAD (peripheral artery disease) (HCC) 08/14/2020   PVD (peripheral vascular disease) (HCC) 06/28/2020   Pneumonia of right lung due to infectious organism 06/21/2020   Essential hypertension 06/21/2020   Empyema of right pleural space (HCC) 06/21/2020   Alcohol abuse 06/21/2020   Ulcer of toe, right, with fat layer exposed (HCC) 06/21/2020   Nicotine dependence, cigarettes, uncomplicated 06/21/2020   Sepsis due to pneumonia (HCC) 06/20/2020    Past Surgical History:  Procedure Laterality Date    right femoral to below-knee popliteal bypass with 6 mm ePTFE  08/14/2020   ABDOMINAL AORTOGRAM W/LOWER EXTREMITY Bilateral 06/27/2020   Procedure: ABDOMINAL AORTOGRAM  W/LOWER EXTREMITY;  Surgeon: Leonie Douglas, MD;  Location: [facility];  Service: Cardiovascular;  Laterality: Bilateral;   ENDARTERECTOMY FEMORAL Right 08/14/2020   Procedure: RIGHT ILIOFEMORAL ENDARTERECTOMY;  Surgeon: Leonie Douglas, MD;  Location: [facility] OR;  Service: Vascular;  Laterality: Right;   FEMORAL-POPLITEAL BYPASS GRAFT Right 08/14/2020   Procedure: RIGHT COMMON FEMORAL TO RIGHT BELOW KNEE POPLITEAL BYPASS GRAFT;  Surgeon: Leonie Douglas, MD;  Location: [facility] OR;  Service: Vascular;  Laterality: Right;   right iliofemoral endarterectomy  08/14/2020    Current Outpatient Rx   Order #: [id number] Class: Normal   Order #: [id number] Class: Historical Med   Order #: [id number] Class: Historical Med   Order #: [id number] Class: Normal   Order #: [id number] Class: Print   Order #: [id number] Class: Historical Med   Order #: [id number] Class: Historical Med   Order #: [id number] Class: Historical Med   Order #: [id number] Class: Historical Med   Order #: [id number] Class: Normal   Order #: [id number] Class: Normal   Order #: [id number] Class: Normal    Allergies Amlodipine and Telmisartan  Family History  Problem Relation Age of Onset   Breast cancer Mother     Social History Social History   Tobacco Use   Smoking status: Every Day    Packs/day: 0.25    Types: Cigarettes   Smokeless tobacco: Never  Vaping Use   Vaping Use: Never used  Substance Use Topics   Alcohol use: Yes    Alcohol/week: 12.0 standard drinks of alcohol  Types: 12 Cans of beer per week   Drug use: No    Review of Systems  LEVEL V CAVEAT APPLIES SECONDARY TO active CPR ____________________________________________  PHYSICAL EXAM:  VITAL SIGNS: ED Triage Vitals [03/28/22 2345]  Enc Vitals Group     BP (!) 164/119     Pulse Rate (!) 123     Resp 20     Temp      Temp src      SpO2 100 %    Constitutional: Well appearing. Well nourished. Eyes: Conjunctivae are normal. 60mm unresponsivePupils.   Head: Atraumatic. Nose: No congestion/rhinnorhea. Mouth/Throat: Mucous membranes are moist.  Oropharynx non-erythematous. Neck: No stridor.  No meningeal signs.   Cardiovascular: no rate, no rhythm.  Respiratory: no respiratory effort.  Lungs with diffuse crackles Gastrointestinal: Soft and nontender. No distention.  Musculoskeletal: No lower extremity tenderness nor edema. No gross deformities of extremities. Neurologic:  Not able to assess 2/2 condition.  Skin:   No rash noted. Psych: Not able to assess 2/2 condition.   ____________________________________________   LABS (all labs ordered are listed, but only abnormal results are displayed)  Labs Reviewed  I-STAT CHEM 8, ED - Abnormal; Notable for the following components:      Result Value   Sodium 119 (*)    Chloride 91 (*)    BUN 4 (*)    Glucose, Bld 152 (*)    Calcium, Ion 0.88 (*)    TCO2 15 (*)    All other components within normal limits  CBG MONITORING, ED - Abnormal; Notable for the following components:   Glucose-Capillary 155 (*)    All other components within normal limits  CULTURE, BLOOD (ROUTINE X 2)  CULTURE, BLOOD (ROUTINE X 2)  COMPREHENSIVE METABOLIC PANEL  CBC WITH DIFFERENTIAL/PLATELET  LACTIC ACID, PLASMA  LACTIC ACID, PLASMA  URINALYSIS, ROUTINE W REFLEX MICROSCOPIC  CBG MONITORING, ED   ____________________________________________  EKG   EKG Interpretation  Date/Time:  Tuesday 03-28-22 23:42:06 EDT Ventricular Rate:  121 PR Interval:    QRS Duration: 122 QT Interval:  388 QTC Calculation: 551 R Axis:   84 Text Interpretation: Undetermined rhythm Nonspecific intraventricular conduction delay Repol abnrm suggests ischemia, lateral leads Borderline ST elevation, anterior leads Baseline wander in lead(s) II V5 Confirmed by Marily Memos [phone]) on 03/02/2022 12:38:56 AM        ____________________________________________  RADIOLOGY  No results  found. ____________________________________________   PROCEDURES  Procedure(s) performed:   .Critical Care  Performed by: Marily Memos, MD Authorized by: Marily Memos, MD   Critical care provider statement:    Critical care time (minutes):  32   Critical care time was exclusive of:  Separately billable procedures and treating other patients and teaching time   Critical care was necessary to treat or prevent imminent or life-threatening deterioration of the following conditions:  Cardiac failure and CNS failure or compromise   Critical care was time spent personally by me on the following activities:  Development of treatment plan with patient or surrogate, evaluation of patient's response to treatment, examination of patient, obtaining history from patient or surrogate, review of old charts, re-evaluation of patient's condition, pulse oximetry, ordering and performing treatments and interventions and ordering and review of laboratory studies Procedure Name: Intubation Date/Time: 03/27/2022 12:39 AM  Performed by: Marily Memos, MDPre-anesthesia Checklist: Patient identified, Patient being monitored, Emergency Drugs available, Timeout performed and Suction available Oxygen Delivery Method: Non-rebreather mask Preoxygenation: Pre-oxygenation with 100% oxygen Induction Type: Rapid  sequence Ventilation: Mask ventilation without difficulty Laryngoscope Size: Glidescope Grade View: Grade I Number of attempts: 1 Placement Confirmation: ETT inserted through vocal cords under direct vision, CO2 detector and Breath sounds checked- equal and bilateral Secured at: 23 cm Tube secured with: ETT holder    CPR  Date/Time: 03/28/22 12:39 AM  Performed by: Marily Memos, MD Authorized by: Marily Memos, MD  CPR Procedure Details:      Amount of time prior to administration of ACLS/BLS (minutes):  0   ACLS/BLS initiated by EMS: Yes     CPR/ACLS performed in the ED: Yes     Duration of CPR  (minutes):  30   Outcome: Pt declared dead    CPR performed via ACLS guidelines under my direct supervision.  See RN documentation for details including defibrillator use, medications, doses and timing. Comments:     See code sheet documentation for details.   ____________________________________________   INITIAL IMPRESSION / ASSESSMENT AND PLAN / ED COURSE  Pertinent labs & imaging results that were available during my care of the patient were reviewed by me and considered in my medical decision making (see chart for details).   See code sheet documentation for details.  In brief, patient was brought in pulseless with a King airway.  Multiple doses of epinephrine, bicarb were given with intermittent episodes of ROSC.  Did have 1 episode of V. tach that led to pulselessness so amnio was started as well.  Patient's King airway was exchanged for an ET tube and end-tidal CO2 stayed around 30 while pulseless and in the 50s and 60s when you did regain pulses for short periods of time.  Blood glucose in the field was in the 160s and here was 155.  His EKG had a significant artifact and difficult to interpret but did have evidence of ST elevation in the right-sided leads and depression laterally.  With his reported left-sided pain and his abnormal EKG with sudden demise suspect likely cardiac arrest secondary to myocardial ischemia.  CPR was performed for over 30 minutes in the ER and over an hour total with multiple doses of medications without any meaningful return of circulation.  Ultrasound showed very minimal left-sided squeeze.  Ultimately patient was declared dead at 62.  Medical examiner Melvyn Neth contacted and does not feel this is a medical examiner case.  Will defer death certificate to PCP.  ____________________________________________  FINAL CLINICAL IMPRESSION(S) / ED DIAGNOSES  Final diagnoses:  Cardiac arrest (HCC)    MEDICATIONS GIVEN DURING THIS VISIT:  Medications  fentaNYL  (SUBLIMAZE) injection 50 mcg (has no administration in time range)  fentaNYL in NS (62mcg/ml) infusion-PREMIX (has no administration in time range)  fentaNYL (SUBLIMAZE) bolus via infusion 50-100 mcg (has no administration in time range)  EPINEPHrine (ADRENALIN) 1 MG/10ML injection (1 mg Intravenous Given 2022/03/28 0000)    NEW OUTPATIENT MEDICATIONS STARTED DURING THIS VISIT:  New Prescriptions   No medications on file    Note:  This document was prepared using Dragon voice recognition software and may include unintentional dictation errors.     Mikaela Hilgeman, Barbara Cower, MD 28-Mar-2022 [phone]

## 2022-04-02 NOTE — ED Triage Notes (Signed)
Pt was having drinks with friends when he began having left leg pain. Pt walked away and friends heard a thud. They found pt unresponsive and called ems. Ems got there and started CPR and gave 3 rounds epi. Got pulses back and started epi drip but lost pulses after a couple of minutes. Resumed cpr and gave 3 more rounds of epi. Pt arrrived with cpr in progress via lucas device.

## 2022-04-02 NOTE — ED Notes (Signed)
Levophed started at 2346 at 50/hr

## 2022-04-02 NOTE — ED Notes (Signed)
[place] [facility] call. Kirk Carrillo confirmation number [id number]

## 2022-04-02 DEATH — deceased
